# Patient Record
Sex: Male | Born: 1935 | Race: White | Hispanic: No | Smoking: Never smoker
Health system: Southern US, Community
[De-identification: ages and names within clinical notes are randomized; demographics above are authoritative.]

## PROBLEM LIST (undated history)

## (undated) DIAGNOSIS — I4892 Unspecified atrial flutter: Secondary | ICD-10-CM

## (undated) DIAGNOSIS — E785 Hyperlipidemia, unspecified: Secondary | ICD-10-CM

## (undated) DIAGNOSIS — N400 Enlarged prostate without lower urinary tract symptoms: Secondary | ICD-10-CM

## (undated) DIAGNOSIS — R001 Bradycardia, unspecified: Secondary | ICD-10-CM

## (undated) DIAGNOSIS — F0390 Unspecified dementia without behavioral disturbance: Secondary | ICD-10-CM

## (undated) HISTORY — DX: Benign prostatic hyperplasia without lower urinary tract symptoms: N40.0

## (undated) HISTORY — DX: Hyperlipidemia, unspecified: E78.5

## (undated) HISTORY — DX: Unspecified atrial flutter: I48.92

## (undated) HISTORY — PX: CATARACT EXTRACTION: SUR2

## (undated) HISTORY — PX: TONSILLECTOMY: SHX5217

---

## 1988-04-17 ENCOUNTER — Encounter: Payer: Self-pay | Admitting: Internal Medicine

## 1992-05-18 ENCOUNTER — Encounter: Payer: Self-pay | Admitting: Internal Medicine

## 1994-01-26 ENCOUNTER — Encounter: Payer: Self-pay | Admitting: Internal Medicine

## 1997-10-14 ENCOUNTER — Encounter: Payer: Self-pay | Admitting: Internal Medicine

## 1997-10-15 ENCOUNTER — Encounter: Payer: Self-pay | Admitting: Internal Medicine

## 1997-10-15 LAB — CONVERTED CEMR LAB
Blood Glucose, Fasting: 94 mg/dL
PSA: 2.6 ng/mL

## 1998-08-11 ENCOUNTER — Encounter: Payer: Self-pay | Admitting: Internal Medicine

## 1998-08-11 LAB — CONVERTED CEMR LAB
PSA: 2.5 ng/mL
TSH: 2.26 microintl units/mL

## 1998-08-12 ENCOUNTER — Encounter: Payer: Self-pay | Admitting: Internal Medicine

## 1998-08-12 LAB — CONVERTED CEMR LAB
Blood Glucose, Fasting: 104 mg/dL
RBC count: 5.06 10*6/uL
WBC, blood: 7.3 10*3/uL

## 1999-11-17 ENCOUNTER — Encounter: Payer: Self-pay | Admitting: Internal Medicine

## 2000-12-05 ENCOUNTER — Encounter: Payer: Self-pay | Admitting: Internal Medicine

## 2000-12-05 LAB — CONVERTED CEMR LAB: PSA: 2.6 ng/mL

## 2004-03-10 ENCOUNTER — Encounter: Payer: Self-pay | Admitting: Internal Medicine

## 2005-05-02 ENCOUNTER — Ambulatory Visit: Payer: Self-pay | Admitting: Internal Medicine

## 2005-05-09 ENCOUNTER — Ambulatory Visit: Payer: Self-pay | Admitting: Internal Medicine

## 2005-08-20 ENCOUNTER — Ambulatory Visit: Payer: Self-pay | Admitting: Family Medicine

## 2006-04-17 ENCOUNTER — Ambulatory Visit: Payer: Self-pay | Admitting: Internal Medicine

## 2006-04-22 ENCOUNTER — Ambulatory Visit: Payer: Self-pay | Admitting: Internal Medicine

## 2006-04-22 ENCOUNTER — Encounter: Payer: Self-pay | Admitting: Internal Medicine

## 2006-04-22 LAB — CONVERTED CEMR LAB: Blood Glucose, Fasting: 102 mg/dL

## 2007-08-05 ENCOUNTER — Encounter: Payer: Self-pay | Admitting: Internal Medicine

## 2007-09-09 ENCOUNTER — Encounter: Payer: Self-pay | Admitting: Internal Medicine

## 2007-09-11 ENCOUNTER — Ambulatory Visit: Payer: Self-pay | Admitting: Internal Medicine

## 2007-09-11 DIAGNOSIS — N401 Enlarged prostate with lower urinary tract symptoms: Secondary | ICD-10-CM

## 2007-09-11 DIAGNOSIS — M109 Gout, unspecified: Secondary | ICD-10-CM | POA: Insufficient documentation

## 2007-09-11 DIAGNOSIS — E785 Hyperlipidemia, unspecified: Secondary | ICD-10-CM

## 2007-09-11 DIAGNOSIS — R3915 Urgency of urination: Secondary | ICD-10-CM

## 2007-09-11 HISTORY — DX: Gout, unspecified: M10.9

## 2007-09-11 HISTORY — DX: Benign prostatic hyperplasia with lower urinary tract symptoms: N40.1

## 2007-09-12 ENCOUNTER — Encounter: Payer: Self-pay | Admitting: Internal Medicine

## 2007-12-29 ENCOUNTER — Ambulatory Visit: Payer: Self-pay | Admitting: Family Medicine

## 2008-02-09 ENCOUNTER — Telehealth (INDEPENDENT_AMBULATORY_CARE_PROVIDER_SITE_OTHER): Payer: Self-pay | Admitting: *Deleted

## 2008-02-10 ENCOUNTER — Telehealth (INDEPENDENT_AMBULATORY_CARE_PROVIDER_SITE_OTHER): Payer: Self-pay | Admitting: *Deleted

## 2008-06-23 ENCOUNTER — Encounter: Payer: Self-pay | Admitting: Internal Medicine

## 2008-08-24 ENCOUNTER — Ambulatory Visit: Payer: Self-pay | Admitting: Family Medicine

## 2008-09-01 ENCOUNTER — Ambulatory Visit: Payer: Self-pay | Admitting: Family Medicine

## 2008-09-13 ENCOUNTER — Telehealth: Payer: Self-pay | Admitting: Family Medicine

## 2008-10-14 ENCOUNTER — Ambulatory Visit: Payer: Self-pay | Admitting: Family Medicine

## 2008-10-15 LAB — CONVERTED CEMR LAB: PSA: 3.45 ng/mL (ref 0.10–4.00)

## 2008-11-01 ENCOUNTER — Ambulatory Visit: Payer: Self-pay | Admitting: Internal Medicine

## 2008-11-02 LAB — CONVERTED CEMR LAB
ALT: 18 units/L (ref 0–53)
AST: 24 units/L (ref 0–37)
Albumin: 3.8 g/dL (ref 3.5–5.2)
Alkaline Phosphatase: 42 units/L (ref 39–117)
Basophils Relative: 0.6 % (ref 0.0–3.0)
Cholesterol: 141 mg/dL (ref 0–200)
GFR calc Af Amer: 81 mL/min
GFR calc non Af Amer: 67 mL/min
HCT: 42.3 % (ref 39.0–52.0)
HDL: 43.4 mg/dL (ref >39.0)
Hemoglobin: 14.4 g/dL (ref 13.0–17.0)
LDL Cholesterol: 77 mg/dL (ref 0–99)
Neutrophils Relative %: 39.5 % — ABNORMAL LOW (ref 43.0–77.0)
Phosphorus: 4.5 mg/dL (ref 2.3–4.6)
Platelets: 183 10*3/uL (ref 150–400)
Potassium: 3.9 meq/L (ref 3.5–5.1)
RBC: 4.51 M/uL (ref 4.22–5.81)
TSH: 2.89 microintl units/mL (ref 0.35–5.50)
Total CHOL/HDL Ratio: 3.2
Uric Acid, Serum: 6.8 mg/dL (ref 4.0–7.8)
WBC: 7.8 10*3/uL (ref 4.5–10.5)

## 2009-02-08 ENCOUNTER — Ambulatory Visit: Payer: Self-pay | Admitting: Internal Medicine

## 2009-06-17 ENCOUNTER — Telehealth: Payer: Self-pay | Admitting: Internal Medicine

## 2009-06-27 ENCOUNTER — Encounter: Payer: Self-pay | Admitting: Internal Medicine

## 2009-08-22 ENCOUNTER — Encounter: Payer: Self-pay | Admitting: Internal Medicine

## 2009-10-05 ENCOUNTER — Encounter: Payer: Self-pay | Admitting: Internal Medicine

## 2009-12-26 ENCOUNTER — Encounter: Payer: Self-pay | Admitting: Internal Medicine

## 2009-12-26 ENCOUNTER — Emergency Department (HOSPITAL_COMMUNITY): Admission: EM | Admit: 2009-12-26 | Discharge: 2009-12-26 | Payer: Self-pay | Admitting: Emergency Medicine

## 2010-01-06 ENCOUNTER — Ambulatory Visit: Payer: Self-pay | Admitting: Internal Medicine

## 2010-02-03 ENCOUNTER — Ambulatory Visit: Payer: Self-pay | Admitting: Internal Medicine

## 2010-02-22 ENCOUNTER — Ambulatory Visit: Payer: Self-pay | Admitting: Internal Medicine

## 2010-02-27 ENCOUNTER — Encounter: Payer: Self-pay | Admitting: Internal Medicine

## 2010-06-27 ENCOUNTER — Encounter: Payer: Self-pay | Admitting: Internal Medicine

## 2010-08-01 ENCOUNTER — Ambulatory Visit: Payer: Self-pay | Admitting: Internal Medicine

## 2010-08-28 ENCOUNTER — Encounter: Payer: Self-pay | Admitting: Internal Medicine

## 2010-11-05 LAB — CONVERTED CEMR LAB
ALT: 18 units/L (ref 0–53)
ALT: 18 units/L (ref 0–53)
Alkaline Phosphatase: 49 units/L (ref 39–117)
Basophils Absolute: 0.1 10*3/uL (ref 0.0–0.1)
Basophils Relative: 1.8 % — ABNORMAL HIGH (ref 0.0–1.0)
Bilirubin, Direct: 0.2 mg/dL (ref 0.0–0.3)
CO2: 28 meq/L (ref 19–32)
CO2: 31 meq/L (ref 19–32)
Calcium: 8.6 mg/dL (ref 8.4–10.5)
Chloride: 102 meq/L (ref 96–112)
Chloride: 105 meq/L (ref 96–112)
Creatinine, Ser: 1 mg/dL (ref 0.4–1.5)
GFR calc Af Amer: 74 mL/min
GFR calc non Af Amer: 74.87 mL/min (ref >60)
Glucose, Bld: 93 mg/dL (ref 70–99)
HCT: 42.8 % (ref 39.0–52.0)
HDL: 42 mg/dL (ref >39.0)
Hemoglobin: 14.9 g/dL (ref 13.0–17.0)
Lymphocytes Relative: 24.6 % (ref 12.0–46.0)
MCHC: 33.9 g/dL (ref 30.0–36.0)
MCV: 92.2 fL (ref 78.0–100.0)
MCV: 93.9 fL (ref 78.0–100.0)
Monocytes Absolute: 0.9 10*3/uL — ABNORMAL HIGH (ref 0.2–0.7)
Monocytes Relative: 10.5 % (ref 3.0–11.0)
Monocytes Relative: 6.3 % (ref 3.0–12.0)
Neutro Abs: 4.2 10*3/uL (ref 1.4–7.7)
Neutrophils Relative %: 52.2 % (ref 43.0–77.0)
Neutrophils Relative %: 67.5 % (ref 43.0–77.0)
Platelets: 218 10*3/uL (ref 150–400)
Potassium: 4 meq/L (ref 3.5–5.1)
RBC: 4.64 M/uL (ref 4.22–5.81)
RDW: 15 % — ABNORMAL HIGH (ref 11.5–14.6)
Sodium: 137 meq/L (ref 135–145)
TSH: 2.79 microintl units/mL (ref 0.35–5.50)
Total Protein: 6.2 g/dL (ref 6.0–8.3)
Triglycerides: 89 mg/dL (ref 0–149)
WBC: 8.1 10*3/uL (ref 4.5–10.5)

## 2010-11-09 NOTE — Letter (Signed)
Summary: [facility]  [facility]   Imported By: Lanelle Bal 03/08/2010 10:21:43  _____________________________________________________________________  External Attachment:    Type:   Image     Comment:   External Document  Appended Document: [facility] PSA down on finasteride on tamsulosin also 6 month follow up

## 2010-11-09 NOTE — Letter (Signed)
Summary: [facility]  [facility]   Imported By: Lanelle Bal 09/07/2010 12:10:43  _____________________________________________________________________  External Attachment:    Type:   Image     Comment:   External Document  Appended Document: [facility] voiding okay on current Rx 1 year follow up

## 2010-11-09 NOTE — Letter (Signed)
Summary: [facility]  [facility]   Imported By: Lanelle Bal 10/11/2009 12:12:52  _____________________________________________________________________  External Attachment:    Type:   Image     Comment:   External Document  Appended Document: [facility] hematuria and prostate nodule switching to finasteride
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
## 2010-11-09 NOTE — Miscellaneous (Signed)
Summary: flu shot at [facility]  Clinical Lists Changes  Observations: Added new observation of FLU VAX: Historical (06/26/2010 9:59)      Immunization History:  Influenza Immunization History:    Influenza:  historical (06/26/2010) Pt received flu vaccine at [address], [place].              Lowella Petties CMA  June 27, 2010 9:59 AM
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
## 2010-11-09 NOTE — Assessment & Plan Note (Signed)
Summary: 6 M F/U DLO   Vital Signs:  Patient profile:   77 year old male Weight:      197 pounds Temp:     97.7 degrees F oral Pulse rate:   60 / minute Pulse rhythm:   regular BP sitting:   140 / 62  (left arm) Cuff size:   large  Vitals Entered By: Mervin Hack CMA Duncan Dull) (August 01, 2010 11:09 AM) CC: 6 month follow-up   History of Present Illness: DOing well  No new concerns  No heart symptoms Tries to "take it easy' Still does a little yard work--limits his time No palpitations No chest pain No SOB  Gout occ gives him some mild symptoms very brief hasn't needed meds Generally in right ankle or knee  No trouble voiding Nocturia x 2--stable some urgency in day--occ leaks no real change  Has puffy place on right shoulder there for a long time No pain  Allergies: No Known Drug Allergies  Past History:  Past medical, surgical, family and social histories (including risk factors) reviewed for relevance to current acute and chronic problems.  Past Medical History: Reviewed history from 02/22/2010 and no changes required. Gout Hyperlipidemia Benign prostatic hypertrophy Atrial flutter  Past Surgical History: Reviewed history from 09/11/2007 and no changes required. Cataract extraction Tonsillectomy  Family History: Reviewed history from 09/11/2007 and no changes required. Mom died of cancer (unknown type) Dad died of throat cancer 1 brother & 3 sisters No CAD, HTN, DM  Social History: Reviewed history from 09/11/2007 and no changes required. Married--4 children Retired--from [employer] Never Smoked Alcohol use-rare  Review of Systems       appetite is good weight stable sleeps fine  Physical Exam  General:  alert and normal appearance.   Neck:  supple, no masses, no thyromegaly, no carotid bruits, and no cervical lymphadenopathy.   Lungs:  normal respiratory effort, no intercostal retractions, no accessory muscle use, and  normal breath sounds.   Heart:  normal rate, regular rhythm, no murmur, and no gallop.   Msk:  no joint tenderness and no joint swelling.   Extremities:  no sig edema Neurologic:  alert & oriented X3, strength normal in all extremities, and gait normal.   Skin:  4x4cm, 2cm deep firm mass over right deltoid (seems to be lipoma--reassured) Psych:  normally interactive, good eye contact, not anxious appearing, and not depressed appearing.     Impression & Recommendations:  Problem # 1:  ATRIAL FLUTTER (ICD-427.31) Assessment Comment Only no changes no symptoms He prefers no further intervention unless has problems  His updated medication list for this problem includes:    Aspirin Ec 81 Mg Tbec (Aspirin) .Marland Kitchen... 1 daily  Problem # 2:  BENIGN PROSTATIC HYPERTROPHY (ICD-600.00) Assessment: Unchanged voiding okay on current regimen has kept up with urologist  Problem # 3:  GOUT (ICD-274.9) Assessment: Unchanged occ ankle and knee symptoms not sure if gout or osteoarthritis uses tylenol occ with success  Complete Medication List: 1)  Flomax 0.4 Mg Cp24 (Tamsulosin hcl) .... Take 1 capsule by mouth daily 2)  Aspirin Ec 81 Mg Tbec (Aspirin) .Marland Kitchen.. 1 daily 3)  Finasteride 5 Mg Tabs (Finasteride) .... Take one tablet once daily  Patient Instructions: 1)  Please schedule a follow-up appointment in 6 months .    Orders Added: 1)  Est. Patient Level IV [16109]    Current Allergies (reviewed today): No known allergies

## 2010-12-09 ENCOUNTER — Encounter: Payer: Self-pay | Admitting: Internal Medicine

## 2011-01-23 ENCOUNTER — Ambulatory Visit (INDEPENDENT_AMBULATORY_CARE_PROVIDER_SITE_OTHER): Payer: Medicare Other | Admitting: Internal Medicine

## 2011-01-23 ENCOUNTER — Encounter: Payer: Self-pay | Admitting: Internal Medicine

## 2011-01-23 VITALS — BP 140/60 | HR 44 | Temp 98.4°F | Ht 72.0 in | Wt 198.0 lb

## 2011-01-23 DIAGNOSIS — I4892 Unspecified atrial flutter: Secondary | ICD-10-CM | POA: Insufficient documentation

## 2011-01-23 DIAGNOSIS — I4891 Unspecified atrial fibrillation: Secondary | ICD-10-CM | POA: Insufficient documentation

## 2011-01-23 DIAGNOSIS — E785 Hyperlipidemia, unspecified: Secondary | ICD-10-CM

## 2011-01-23 DIAGNOSIS — N400 Enlarged prostate without lower urinary tract symptoms: Secondary | ICD-10-CM

## 2011-01-23 DIAGNOSIS — M109 Gout, unspecified: Secondary | ICD-10-CM

## 2011-01-23 HISTORY — DX: Unspecified atrial fibrillation: I48.91

## 2011-01-23 NOTE — Patient Instructions (Addendum)
Please make sure you take your aspirin every day!!  Atrial Fibrillation (Irregular Heartbeat) Your caregiver has diagnosed you with atrial fibrillation (AFib). The heart normally beats very regularly; AFib is a type of irregular heartbeat. The heart rate may be faster or slower than normal. This can prevent your heart from pumping as well as it should. AFib can be constant (chronic) or intermittent (paroxysmal). CAUSES Atrial fibrillation may be caused by:  Heart disease, including heart attack, coronary artery disease, heart failure, diseases of the heart valves, and others.   Blood clot in the lungs (pulmonary embolism).   Pneumonia or other infections.   Chronic lung disease.   Thyroid disease.   Toxins. These include alcohol, some medications (such as decongestant medications or diet pills), and caffeine.  In some people, no cause for AFib can be found. This is referred to as Lone Atrial Fibrillation. SYMPTOMS  Palpitations or a fluttering in your chest.   A vague sense of chest discomfort.   Shortness of breath.   Sudden onset of lightheadedness or weakness.  Sometimes, the first sign of AFib can be a complication of the condition. This could be a stroke or heart failure. DIAGNOSIS Your description of your condition may make your caregiver suspicious of atrial fibrillation. Your caregiver will examine your pulse to determine if fibrillation is present. An EKG (electrocardiogram) will confirm the diagnosis. Further testing may help determine what caused you to have atrial fibrillation. This may include chest x-ray, echocardiogram, blood tests, or CT scans. PREVENTION If you have previously had atrial fibrillation, your caregiver may advise you to avoid substances known to cause the condition (such as stimulant medications, and possibly caffeine or alcohol). You may be advised to use medications to prevent recurrence. Proper treatment of any underlying condition is important to  help prevent recurrence. PROGNOSIS Atrial fibrillation does tend to become a chronic condition over time. It can cause significant complications (see below). Atrial fibrillation is not usually immediately life-threatening, but it can shorten your life expectancy. This seems to be worse in women. If you have lone atrial fibrillation and are under 24 years old, the risk of complications is very low, and life expectancy is not shortened. RISKS AND COMPLICATIONS Complications of atrial fibrillation can include stroke, chest pain, and heart failure. Your caregiver will recommend treatments for the atrial fibrillation, as well as for any underlying conditions, to help minimize risk of complications. TREATMENT Treatment for AFib is divided into several categories:  Treatment of any underlying condition.   Converting you out of AFib into a regular (sinus) rhythm.   Controlling rapid heart rate.   Prevention of blood clots and stroke.  Medications and procedures are available to convert your atrial fibrillation to sinus rhythm. However, recent studies have shown that this may not offer you any advantage, and cardiac experts are continuing research and debate on this topic. More important is controlling your rapid heartbeat. The rapid heartbeat causes more symptoms, and places strain on your heart. Your caregiver will advise you on the use of medications that can control your heart rate. Atrial fibrillation is a strong stroke risk. You can lessen this risk by taking blood thinning medications such as Coumadin (warfarin), or sometimes aspirin. These medications need close monitoring by your caregiver. Over-medication can cause bleeding. Too little medication may not protect against stroke. HOME CARE INSTRUCTIONS  If your caregiver prescribed medicine to make your heartbeat more normally, take as directed.   If blood thinners were prescribed by your caregiver,  take EXACTLY as directed.   Perform blood  tests EXACTLY as directed.   Quit smoking. Smoking increases your cardiac and lung (pulmonary) risks.   DO NOT drink alcohol.   DO NOT drink caffeinated drinks (e.g. coffee, pop, chocolate, and leaf teas). You may drink decaffeinated coffee, pop or tea.   If you are overweight, you should choose a reduced calorie diet to lose weight. Please see a registered dietitian if you need more information about healthy weight loss. DO NOT USE DIET PILLS as they may aggravate heart problems.   If you have other heart problems that are causing AFib, you may need to eat a low salt, fat, and cholesterol diet. Your caregiver will tell you if this is necessary.   Exercise every day to improve your physical fitness. Stay active unless advised otherwise.   If your caregiver has given you a follow-up appointment, it is very important to keep that appointment. Not keeping the appointment could result in heart failure or stroke. If there is any problem keeping the appointment, you must call back to this facility for assistance.  SEEK MEDICAL CARE IF:  You notice a change in the rate, rhythm or strength of your heartbeat.   You develop an infection or any other change in your overall health status.  SEEK IMMEDIATE MEDICAL CARE IF:  You develop chest pain, abdominal pain, sweating, weakness or feel sick to your stomach (nausea).   You develop shortness of breath.   You develop swollen feet and ankles.   You develop dizziness, numbness, or weakness of your face or limbs, or any change in vision or speech.  MAKE SURE YOU:  Understand these instructions.   Will watch your condition.   Will get help right away if you are not doing well or get worse.  Document Released: 09/24/2005 Document Re-Released: 03/14/2010 [facility] Patient Information 2011 [place], [place].

## 2011-01-23 NOTE — Progress Notes (Signed)
  Subjective:    Patient ID: Matthew Howard, male    DOB: 1919/06/27, 77 y.o.   MRN: [id number]  HPI DOing fairly well Did have sense of "something coming over my body" last night after supper Restless, had to get up Felt sweaty--no better with a couple of aspirin Tingling in hands Took a shower and went to bed.  Feels better this AM but wonders what happened No palpitations No chest pain No dyspnea  No problems voiding Nocturia x 2 but stable Still on meds for this  No gout problems No other arthritis problems  Current outpatient prescriptions:aspirin 81 MG EC tablet, Take 81 mg by mouth daily.  , Disp: , Rfl: ;  finasteride (PROSCAR) 5 MG tablet, Take 5 mg by mouth daily.  , Disp: , Rfl: ;  Tamsulosin HCl (FLOMAX) 0.4 MG CAPS, Take 0.4 mg by mouth daily.  , Disp: , Rfl:   Past Medical History  Diagnosis Date  . Gout   . Hyperlipidemia   . Atrial flutter   . Benign prostatic hypertrophy     Past Surgical History  Procedure Date  . Cataract extraction   . Tonsillectomy     Family History  Problem Relation Age of Onset  . Cancer Mother   . Throat cancer Father     History   Social History  . Marital Status: Married    Spouse Name: Demico Ploch    Number of Children: 4  . Years of Education: N/A   Occupational History  . retired     [employer]   Social History Main Topics  . Smoking status: Never Smoker   . Smokeless tobacco: Not on file  . Alcohol Use: Yes     rare etoh use  . Drug Use: Not on file  . Sexually Active: Not on file   Other Topics Concern  . Not on file   Social History Narrative  . No narrative on file   Review of Systems Stays active--flowers in garden. Occ walks Appetite okay Weight is stable     Objective:   Physical Exam  Constitutional: He appears well-developed and well-nourished. No distress.  Neck: Normal range of motion. Neck supple. No JVD present. No thyromegaly present.       ?referred murmur in right  carotid  Cardiovascular: Normal rate and intact distal pulses.  Exam reveals no gallop.   Murmur heard.      Soft systolic murmur at base but blowing quality soft murmur at apex also Irregular rhythm  Pulmonary/Chest: Effort normal and breath sounds normal. No respiratory distress. He has no wheezes. He has no rales.  Abdominal: Soft. He exhibits no mass. There is no tenderness.  Musculoskeletal: He exhibits no tenderness.       Trace to 1+ left ankle edema  Lymphadenopathy:    He has no cervical adenopathy.  Psychiatric: He has a normal mood and affect. His behavior is normal. Judgment and thought content normal.          Assessment & Plan:

## 2011-02-21 ENCOUNTER — Encounter: Payer: Self-pay | Admitting: Internal Medicine

## 2011-02-21 ENCOUNTER — Other Ambulatory Visit: Payer: Self-pay | Admitting: *Deleted

## 2011-02-21 ENCOUNTER — Ambulatory Visit (INDEPENDENT_AMBULATORY_CARE_PROVIDER_SITE_OTHER): Payer: Medicare Other | Admitting: Internal Medicine

## 2011-02-21 VITALS — BP 148/62 | HR 52 | Temp 97.7°F | Ht 72.0 in | Wt 200.0 lb

## 2011-02-21 DIAGNOSIS — N400 Enlarged prostate without lower urinary tract symptoms: Secondary | ICD-10-CM

## 2011-02-21 DIAGNOSIS — M109 Gout, unspecified: Secondary | ICD-10-CM

## 2011-02-21 DIAGNOSIS — I4891 Unspecified atrial fibrillation: Secondary | ICD-10-CM

## 2011-02-21 NOTE — Progress Notes (Signed)
  Subjective:    Patient ID: Matthew Howard, male    DOB: 1919/06/06, 76 y.o.   MRN: [id number]  HPI DOing well Feels pretty well  Gets occ itching in shoulder No major pain  Gets some swelling in left ankle No pain though No gout problems  No palpitations No chest pain Does get tired with activity--has to rest About the same for a while Can probably walk at least a mile without a problem Does take rest if carrying weights with yard work, etc  Still with nocturia x 2 Notes some delay in voiding at night---not in daytime though  Current outpatient prescriptions:aspirin 81 MG EC tablet, Take 81 mg by mouth daily.  , Disp: , Rfl: ;  finasteride (PROSCAR) 5 MG tablet, Take 5 mg by mouth daily.  , Disp: , Rfl: ;  Tamsulosin HCl (FLOMAX) 0.4 MG CAPS, Take 0.4 mg by mouth daily.  , Disp: , Rfl:   Past Medical History  Diagnosis Date  . Gout   . Hyperlipidemia   . Atrial flutter   . Benign prostatic hypertrophy     Past Surgical History  Procedure Date  . Cataract extraction   . Tonsillectomy     Family History  Problem Relation Age of Onset  . Cancer Mother   . Throat cancer Father     History   Social History  . Marital Status: Married    Spouse Name: Matthew Howard    Number of Children: 4  . Years of Education: N/A   Occupational History  . retired     [employer]   Social History Main Topics  . Smoking status: Never Smoker   . Smokeless tobacco: Not on file  . Alcohol Use: Yes     rare etoh use  . Drug Use: Not on file  . Sexually Active: Not on file   Other Topics Concern  . Not on file   Social History Narrative  . No narrative on file   Review of Systems occ tingling in left hand Appetite is good Weight stable Sleeps well    Objective:   Physical Exam  Constitutional: He appears well-developed and well-nourished. No distress.  Neck: Normal range of motion. No thyromegaly present.  Cardiovascular: Normal rate and intact distal pulses.   Exam reveals no gallop.   Murmur heard.      Slightly irregular Soft mitral systolic murmur  Pulmonary/Chest: Effort normal and breath sounds normal. No respiratory distress. He has no wheezes. He has no rales.  Abdominal: Soft. There is no tenderness.  Musculoskeletal: Normal range of motion. He exhibits no tenderness.       Slight edema without pitting at left ankle  Lymphadenopathy:    He has no cervical adenopathy.  Psychiatric: He has a normal mood and affect. His behavior is normal. Judgment and thought content normal.          Assessment & Plan:

## 2011-02-23 ENCOUNTER — Ambulatory Visit: Payer: BC Managed Care – PPO | Admitting: Internal Medicine

## 2011-02-23 NOTE — Assessment & Plan Note (Signed)
[facility]                             [place] OFFICE NOTE   SANDERS, MANNINEN                     MRN:          [id number]  DATE:04/17/2006                            DOB:          1919/07/13    Matthew Howard is a delightful 77 year old gentleman last seen in the office  August 20, 2005 by Everrett Coombe, FNP-C, for bronchitis.  I last say the  patient on May 02, 2005.  The patient reports in the interval that he has  had cataract surgery in both eyes with intraocular lens implants in the  winter of 2007.  The patient also recently picked up new hearing aids.  Otherwise, he reports he is feeling well and doing well.   Past medical history, family history and social history are well-documented  in my note of March 08, 2004 without any changes.   REVIEW OF SYSTEMS:  The patient  has had no fevers, sweats, chills, weight  changes or other constitutional symptoms.  He has had cataract surgery as  noted above.  No ENT, cardiovascular, respiratory, GI or GU complaints.  He  does report having cramps in his hands.   CURRENT MEDICATIONS:  1.  Zocor 40 mg daily.  2.  Indocin on a p.r.n. basis for gout, which has been quiescent.   EXAMINATION:  VITAL SIGNS:  Temperature was 97, blood pressure 134/68, pulse  68.  Weight 193.  Height 6 feet 1 inch.  GENERAL APPEARANCE:  This is a well-nourished, well-developed gentleman  looking younger than his stated chronologic age, in no acute distress.  HEENT:  Normocephalic, atraumatic.  EACs with cerumen, but no impaction.  TMs were normal.  Oropharynx without lesions.  Posterior pharynx was clear.  Conjunctivae and sclerae were clear.  Pupils were equal, round and reactive.  No further exam conducted.  NECK:  Supple without thyromegaly.  NODES:  No adenopathy was noted in the cervical or supraclavicular regions.  CHEST:  No CVA tenderness.  Lungs are clear to auscultation and percussion.  CARDIOVASCULAR:   Radial pulses 2+.  No JVD.  No carotid bruits.  He had a  quiet precordium with a regular rate and rhythm, without murmurs.  ABDOMEN:  Soft.  No guarding or rebound.  No organosplenomegaly was noted.  RECTAL:  Exam revealed normal sphincter tone with external hemorrhoidal skin  tags noted.  The patient's prostate was slightly enlarged; it was firm  without specific nodule.  EXTREMITIES:  Without clubbing, cyanosis, or edema.   ASSESSMENT AND PLAN:  1.  Hyperlipidemia:  The patient was given a refill prescription for his      Zocor.  He would be a candidate for followup laboratory and he will      return for that at his convenience.  Last laboratory from March 10, 2004      revealed an LDL of 104.  2.  Gout.  The patient reports he has not had a flare of gout and is doing      well.  3.  Ophthalmologic:  Patient with successful surgery and is doing well.  4.  Health maintenance:  Given the patient's age and his last flexible      sigmoidoscopy in 2002, he is not a candidate at this time for further      treatment.   In summary, this is a pleasant gentleman who seems to be medically stable at  this time.  He will be notified as to his laboratory results.  He will  return to see me in 1 year or on an as-needed basis.                                   Rosalyn Gess Norins, MD   MEN/MedQ  DD:  04/18/2006  DT:  04/18/2006  Job #:  [id number]   cc:   Eda Keys

## 2011-03-23 ENCOUNTER — Encounter: Payer: Self-pay | Admitting: Family Medicine

## 2011-03-23 ENCOUNTER — Ambulatory Visit (INDEPENDENT_AMBULATORY_CARE_PROVIDER_SITE_OTHER): Payer: Medicare Other | Admitting: Family Medicine

## 2011-03-23 ENCOUNTER — Ambulatory Visit: Payer: Medicare Other | Admitting: Family Medicine

## 2011-03-23 VITALS — BP 130/58 | HR 56 | Temp 97.9°F | Wt 197.0 lb

## 2011-03-23 DIAGNOSIS — L0291 Cutaneous abscess, unspecified: Secondary | ICD-10-CM

## 2011-03-23 DIAGNOSIS — L0390 Cellulitis, unspecified: Secondary | ICD-10-CM

## 2011-03-23 MED ORDER — DOXYCYCLINE HYCLATE 100 MG PO TABS: 100.0000 mg | ORAL_TABLET | Freq: Two times a day (BID) | ORAL | Status: AC

## 2011-03-23 NOTE — Patient Instructions (Signed)
Start the doxycycline today and come back for a recheck on Monday with Dr. Alphonsus Sias or me.  If you get more redness or pain in the meantime, then notify the on call doctor.  This should gradually get better.  Take care.

## 2011-03-23 NOTE — Progress Notes (Signed)
Slipped going up steps and hit R knee about 2 weeks ago.  Anterior inferior knee was scabbed over.  It wasn't puffy or red until 1-2 days ago.  Now the anterior knee is puffy and red, tender to touch.  No FCNAV.    Meds, vitals, and allergies reviewed.   ROS: See HPI.  Otherwise, noncontributory.  nad Knee is not ttp or red posteriorly or medially/laterally.  Joint line isn't ttp.  3x3 cm scab inferior to R knee .  Pain anteriorly with knee flexion, no pain with extension.  Anterior knee with erythema that was marked today (superior to the scab).  Tender puffy raised area superficial to the kneecap noted.    I talked with patient about this.  It could potentially contain purulent material and may need aspiration.  I told him it could just be a cellulitis that wouldn't yield fluid.  Verbal informed consent for aspiration of the area.  Risks not limited to infection, bleeding, damage to near by organs.  He wanted to proceed.   Area cleaned with betadine/etoh.  Topical spray anesthetic used and sterile 19g needle advanced but unable to aspirate any fluid.  Tolerated well, minimal blood loss.  Good hemostasis.  No complications.

## 2011-03-23 NOTE — Assessment & Plan Note (Signed)
No purulent material aspirated.  I doubt an infected bursa or abscess given the lack of yield.  Likely a cellulitis with local tissue puffiness.  Okay for outpatient fu.  He'll monitor the border, call MD if enlarging or systemic sx.  This doesn't appear to be a septic joint.  Start doxy and then fu on Monday.  He agrees.

## 2011-03-26 ENCOUNTER — Ambulatory Visit (INDEPENDENT_AMBULATORY_CARE_PROVIDER_SITE_OTHER): Payer: Medicare Other | Admitting: Family Medicine

## 2011-03-26 ENCOUNTER — Encounter: Payer: Self-pay | Admitting: Family Medicine

## 2011-03-26 DIAGNOSIS — L03119 Cellulitis of unspecified part of limb: Secondary | ICD-10-CM

## 2011-03-26 DIAGNOSIS — L0390 Cellulitis, unspecified: Secondary | ICD-10-CM

## 2011-03-26 NOTE — Assessment & Plan Note (Signed)
Improved, continue doxy and f/u prn. He agrees.  Area dressed in clinic.

## 2011-03-26 NOTE — Progress Notes (Signed)
F/u of cellulitis of leg. Improved with less pain, erythema, and less puffy.  Feeling well o/w.  ROM improved.  No other complaints.  On doxy.  No ADE. Sun-sensitivity caution prev given to patient.  Meds, vitals, and allergies reviewed.   ROS: See HPI.  Otherwise, noncontributory.  nad r knee with dec in erythema and puffiness, inferior scab still healing.  rom improved and gait improved with no pain

## 2011-03-26 NOTE — Patient Instructions (Signed)
I would take all antibiotics.  Let us know if the area gets redder or more painful.  This should resolve.  Take care.

## 2011-06-25 ENCOUNTER — Other Ambulatory Visit: Payer: Self-pay | Admitting: Internal Medicine

## 2011-08-24 ENCOUNTER — Encounter: Payer: Self-pay | Admitting: Internal Medicine

## 2011-08-24 ENCOUNTER — Ambulatory Visit (INDEPENDENT_AMBULATORY_CARE_PROVIDER_SITE_OTHER): Payer: Medicare Other | Admitting: Internal Medicine

## 2011-08-24 DIAGNOSIS — N400 Enlarged prostate without lower urinary tract symptoms: Secondary | ICD-10-CM

## 2011-08-24 DIAGNOSIS — I4891 Unspecified atrial fibrillation: Secondary | ICD-10-CM

## 2011-08-24 DIAGNOSIS — M109 Gout, unspecified: Secondary | ICD-10-CM

## 2011-08-24 LAB — BASIC METABOLIC PANEL
BUN: 18 mg/dL (ref 6–23)
CO2: 29 mEq/L (ref 19–32)
Calcium: 8.8 mg/dL (ref 8.4–10.5)
Chloride: 104 mEq/L (ref 96–112)
Creatinine, Ser: 1 mg/dL (ref 0.4–1.5)
GFR: 71.3 mL/min (ref >60.00)
Potassium: 4.3 mEq/L (ref 3.5–5.1)
Sodium: 142 mEq/L (ref 135–145)

## 2011-08-24 LAB — CBC WITH DIFFERENTIAL/PLATELET
Eosinophils Absolute: 0.2 10*3/uL (ref 0.0–0.7)
Eosinophils Relative: 2.5 % (ref 0.0–5.0)
Hemoglobin: 13.9 g/dL (ref 13.0–17.0)
Lymphs Abs: 2.8 10*3/uL (ref 0.7–4.0)
Monocytes Absolute: 0.6 10*3/uL (ref 0.1–1.0)
Platelets: 223 10*3/uL (ref 150.0–400.0)
RBC: 4.4 Mil/uL (ref 4.22–5.81)
WBC: 8 10*3/uL (ref 4.5–10.5)

## 2011-08-24 LAB — HEPATIC FUNCTION PANEL
Albumin: 3.8 g/dL (ref 3.5–5.2)
Alkaline Phosphatase: 58 U/L (ref 39–117)

## 2011-08-24 NOTE — Assessment & Plan Note (Signed)
Good rate control without meds ASA only since only risk is his age

## 2011-08-24 NOTE — Assessment & Plan Note (Signed)
Voiding okay on his dual Rx Mild symptoms only (frequency and occ dribbling incontinence) No changes

## 2011-08-24 NOTE — Progress Notes (Signed)
  Subjective:    Patient ID: Matthew Howard, male    DOB: 06/16/1919, 77 y.o.   MRN: [id number]  HPI Feels well  No heart problems No palpitations No chest pain or SOB Does note limited exercise tolerance--but he thinks he can do 2 hours of outside work without resting No edema recently  No gout problems---or only occ twinges No other arthritis  Still with nocturia x 2---stable Notes daytime freq---every 2 hours--not bothersome occ gets urgency and incontinence (dribbling before he makes it to bathroom)  Current Outpatient Prescriptions on File Prior to Visit  Medication Sig Dispense Refill  . aspirin 81 MG EC tablet Take 81 mg by mouth daily.        . finasteride (PROSCAR) 5 MG tablet Take 5 mg by mouth daily.        . Tamsulosin HCl (FLOMAX) 0.4 MG CAPS TAKE 1 CAPSULE BY MOUTH DAILY  90 capsule  3    No Known Allergies  Past Medical History  Diagnosis Date  . Gout   . Hyperlipidemia   . Atrial flutter   . Benign prostatic hypertrophy     Past Surgical History  Procedure Date  . Cataract extraction   . Tonsillectomy     Family History  Problem Relation Age of Onset  . Cancer Mother   . Throat cancer Father     History   Social History  . Marital Status: Married    Spouse Name: Carson Bogden    Number of Children: 4  . Years of Education: N/A   Occupational History  . retired     [employer]   Social History Main Topics  . Smoking status: Never Smoker   . Smokeless tobacco: Never Used  . Alcohol Use: Yes     rare etoh use  . Drug Use: Not on file  . Sexually Active: Not on file   Other Topics Concern  . Not on file   Social History Narrative  . No narrative on file   Review of Systems Appetite is good Has lost some weight but he didn't notice. He is trying to be careful with his eating---had noticed his belly more Sleeps well     Objective:   Physical Exam  Constitutional: He appears well-developed and well-nourished. No distress.    Neck: Normal range of motion. Neck supple. No thyromegaly present.  Cardiovascular: Normal rate, normal heart sounds and intact distal pulses.  Exam reveals no gallop.   No murmur heard.      irregular  Pulmonary/Chest: Effort normal and breath sounds normal. No respiratory distress. He has no wheezes. He has no rales.  Abdominal: Soft. He exhibits no mass. There is no tenderness.       No suprapubic dullness  Musculoskeletal: He exhibits no edema and no tenderness.  Lymphadenopathy:    He has no cervical adenopathy.  Psychiatric: He has a normal mood and affect. His behavior is normal. Judgment and thought content normal.          Assessment & Plan:

## 2011-08-24 NOTE — Assessment & Plan Note (Signed)
Quiet Uses cherry juice if acts up at all with good effect

## 2012-06-23 ENCOUNTER — Other Ambulatory Visit: Payer: Self-pay | Admitting: *Deleted

## 2012-06-23 NOTE — Telephone Encounter (Signed)
Ok to fill? Last seen 06/2011, no future appointments scheduled

## 2012-06-24 MED ORDER — TAMSULOSIN HCL 0.4 MG PO CAPS: 0.4000 mg | ORAL_CAPSULE | Freq: Every day | ORAL | Status: DC

## 2012-06-24 NOTE — Telephone Encounter (Signed)
Okay to fill 6 months and have him schedule follow up soon

## 2012-06-24 NOTE — Telephone Encounter (Signed)
rx sent to pharmacy by e-script Spoke with patient and advised results, he will call back for an appt

## 2012-06-27 ENCOUNTER — Encounter: Payer: Self-pay | Admitting: Internal Medicine

## 2012-06-27 ENCOUNTER — Ambulatory Visit (INDEPENDENT_AMBULATORY_CARE_PROVIDER_SITE_OTHER): Payer: Medicare Other | Admitting: Internal Medicine

## 2012-06-27 VITALS — BP 140/68 | HR 61 | Temp 98.0°F | Ht 72.0 in | Wt 190.0 lb

## 2012-06-27 DIAGNOSIS — I4891 Unspecified atrial fibrillation: Secondary | ICD-10-CM | POA: Diagnosis not present

## 2012-06-27 DIAGNOSIS — N400 Enlarged prostate without lower urinary tract symptoms: Secondary | ICD-10-CM | POA: Diagnosis not present

## 2012-06-27 DIAGNOSIS — M109 Gout, unspecified: Secondary | ICD-10-CM | POA: Diagnosis not present

## 2012-06-27 LAB — CBC WITH DIFFERENTIAL/PLATELET
Eosinophils Absolute: 0.2 10*3/uL (ref 0.0–0.7)
Hemoglobin: 13.7 g/dL (ref 13.0–17.0)
Monocytes Absolute: 0.6 10*3/uL (ref 0.1–1.0)
Monocytes Relative: 8.5 % (ref 3.0–12.0)
Neutrophils Relative %: 49.7 % (ref 43.0–77.0)
Platelets: 184 10*3/uL (ref 150.0–400.0)
RBC: 4.44 Mil/uL (ref 4.22–5.81)
RDW: 15.5 % — ABNORMAL HIGH (ref 11.5–14.6)
WBC: 7.4 10*3/uL (ref 4.5–10.5)

## 2012-06-27 LAB — HEPATIC FUNCTION PANEL
AST: 21 U/L (ref 0–37)
Alkaline Phosphatase: 45 U/L (ref 39–117)
Total Protein: 6.6 g/dL (ref 6.0–8.3)

## 2012-06-27 LAB — BASIC METABOLIC PANEL
BUN: 20 mg/dL (ref 6–23)
Calcium: 9 mg/dL (ref 8.4–10.5)
Creatinine, Ser: 1 mg/dL (ref 0.4–1.5)
GFR: 71.16 mL/min (ref >60.00)
Glucose, Bld: 81 mg/dL (ref 70–99)

## 2012-06-27 LAB — TSH: TSH: 1.02 u[IU]/mL (ref 0.35–5.50)

## 2012-06-27 LAB — URIC ACID: Uric Acid, Serum: 7.5 mg/dL (ref 4.0–7.8)

## 2012-06-27 MED ORDER — FINASTERIDE 5 MG PO TABS: 5.0000 mg | ORAL_TABLET | Freq: Every day | ORAL | Status: DC

## 2012-06-27 NOTE — Assessment & Plan Note (Signed)
Occ mild outbreaks Cherry juice controls fine

## 2012-06-27 NOTE — Assessment & Plan Note (Signed)
Good rate control without meds ASA only due to age and no risk factors other than age

## 2012-06-27 NOTE — Assessment & Plan Note (Signed)
Mild symptoms but generally satisfied with the meds

## 2012-06-27 NOTE — Progress Notes (Signed)
  Subjective:    Patient ID: Matthew Howard, male    DOB: 08-19-1919, 78 y.o.   MRN: [id number]  HPI Doing well No new concerns Reviewed advanced directives  Still works hard in yard Stable exercise tolerance No palpitations No chest pain No dyspnea  Mild brief ankle edema No dizziness or syncope  Has had occ mild gout attacks Still does fine with the cherry juice  Voids okay Slow stream but okay Nocturia stable at 2 Some daytime urgency  Current Outpatient Prescriptions on File Prior to Visit  Medication Sig Dispense Refill  . aspirin 81 MG EC tablet Take 81 mg by mouth daily.        . Tamsulosin HCl (FLOMAX) 0.4 MG CAPS Take 1 capsule (0.4 mg total) by mouth daily.  90 capsule  1    No Known Allergies  Past Medical History  Diagnosis Date  . Gout   . Hyperlipidemia   . Atrial flutter   . Benign prostatic hypertrophy     Past Surgical History  Procedure Date  . Cataract extraction   . Tonsillectomy     Family History  Problem Relation Age of Onset  . Cancer Mother   . Throat cancer Father     History   Social History  . Marital Status: Married    Spouse Name: Daimion Balestrieri    Number of Children: 4  . Years of Education: N/A   Occupational History  . retired     [employer]   Social History Main Topics  . Smoking status: Never Smoker   . Smokeless tobacco: Never Used  . Alcohol Use: Yes     rare etoh use  . Drug Use: Not on file  . Sexually Active: Not on file   Other Topics Concern  . Not on file   Social History Narrative   No living willNo health care power of attorney--would want wife thoughWould accept resuscitation but wouldn't want prolonged artificial ventilationNo tube feeds if cognitively unaware   Review of Systems Mood is generally fine Notes easy bleeding on the aspirin---works with [employer] a lot    Objective:   Physical Exam  Constitutional: He appears well-developed and well-nourished. No distress.  Neck: Normal  range of motion. Neck supple. No thyromegaly present.  Cardiovascular: Normal rate, normal heart sounds and intact distal pulses.  Exam reveals no gallop.   No murmur heard.      irregular  Pulmonary/Chest: Effort normal and breath sounds normal. No respiratory distress. He has no wheezes. He has no rales.  Abdominal: Soft. There is no tenderness.  Musculoskeletal: He exhibits no edema and no tenderness.  Lymphadenopathy:    He has no cervical adenopathy.  Skin: No rash noted. No erythema.       Mycotic toenails Multiple large seborrheic keratoses  Psychiatric: He has a normal mood and affect. His behavior is normal.          Assessment & Plan:

## 2012-06-30 ENCOUNTER — Encounter: Payer: Self-pay | Admitting: *Deleted

## 2012-07-03 DIAGNOSIS — Z23 Encounter for immunization: Secondary | ICD-10-CM | POA: Diagnosis not present

## 2012-08-27 DIAGNOSIS — R972 Elevated prostate specific antigen [PSA]: Secondary | ICD-10-CM | POA: Diagnosis not present

## 2012-10-20 DIAGNOSIS — N138 Other obstructive and reflux uropathy: Secondary | ICD-10-CM | POA: Diagnosis not present

## 2012-10-20 DIAGNOSIS — N403 Nodular prostate with lower urinary tract symptoms: Secondary | ICD-10-CM | POA: Diagnosis not present

## 2012-10-20 DIAGNOSIS — R972 Elevated prostate specific antigen [PSA]: Secondary | ICD-10-CM | POA: Diagnosis not present

## 2012-11-12 ENCOUNTER — Encounter: Payer: Self-pay | Admitting: Family Medicine

## 2012-11-12 ENCOUNTER — Ambulatory Visit (INDEPENDENT_AMBULATORY_CARE_PROVIDER_SITE_OTHER): Payer: Medicare Other | Admitting: Family Medicine

## 2012-11-12 ENCOUNTER — Telehealth: Payer: Self-pay | Admitting: Internal Medicine

## 2012-11-12 VITALS — BP 140/60 | HR 70 | Temp 97.6°F | Wt 199.0 lb

## 2012-11-12 DIAGNOSIS — J069 Acute upper respiratory infection, unspecified: Secondary | ICD-10-CM

## 2012-11-12 MED ORDER — BENZONATATE 200 MG PO CAPS: 200.0000 mg | ORAL_CAPSULE | Freq: Three times a day (TID) | ORAL | Status: AC | PRN

## 2012-11-12 NOTE — Assessment & Plan Note (Signed)
Nontoxic, likely viral.  Would use tessalon for cough and f/u prn.  I would avoid antihistamines due to possible LUTS sx, esp with his prostate hx.

## 2012-11-12 NOTE — Telephone Encounter (Signed)
Will await the evaluation tomorrow

## 2012-11-12 NOTE — Progress Notes (Signed)
Cough worse in last 1-2 days, "coughed all night last night". Started about 1 week ago with persistent rhinorrhea.  Anterior clear rhinorrhea.  occ phlegm coughed up, clear.  No fevers.  No aches.  No ear pain.  No ST.  He took some OTC cough meds w/o much relief.  Prev he had some relief of cough with some warm grape juice.    Meds, vitals, and allergies reviewed.   ROS: See HPI.  Otherwise, noncontributory.  GEN: nad, alert and oriented HEENT: mucous membranes moist, tm w/o erythema, nasal exam w/o erythema, clear discharge noted,  OP with cobblestoning, sinuses not ttp NECK: supple w/o LA CV: IRR, not tachy PULM: ctab, no inc wob

## 2012-11-12 NOTE — Patient Instructions (Addendum)
Take tessalon for the cough, up to 3 times a day.  Take care.  This should gradually get better.

## 2012-11-12 NOTE — Telephone Encounter (Signed)
Patient Information:  Caller Name: Ryler  Phone: [phone]  Patient: Matthew Howard, Matthew Howard  Gender: Male  DOB: [birth year]  Age: 79 Years  PCP: Tillman Abide Navos)  Office Follow Up:  Does the office need to follow up with this patient?: No  Instructions For The Office: N/A   Symptoms  Reason For Call & Symptoms: Patient states he has cough, runny nose clear in color, afebrile.  Reviewed Health History In EMR: Yes  Reviewed Medications In EMR: Yes  Reviewed Allergies In EMR: Yes  Reviewed Surgeries / Procedures: Yes  Date of Onset of Symptoms: 11/05/2012  Guideline(s) Used:  Cough  Disposition Per Guideline:   See Today or Tomorrow in Office  Reason For Disposition Reached:   Patient wants to be seen  Advice Given:  N/A  Appointment Scheduled:  11/12/2012 15:30:00 Appointment Scheduled Provider:  Crawford Givens Clelia Croft) [facility])

## 2013-07-01 ENCOUNTER — Encounter: Payer: Self-pay | Admitting: Internal Medicine

## 2013-07-01 ENCOUNTER — Ambulatory Visit (INDEPENDENT_AMBULATORY_CARE_PROVIDER_SITE_OTHER): Payer: Medicare Other | Admitting: Internal Medicine

## 2013-07-01 VITALS — BP 144/70 | HR 78 | Temp 98.1°F | Ht 72.0 in | Wt 190.8 lb

## 2013-07-01 DIAGNOSIS — Z0000 Encounter for general adult medical examination without abnormal findings: Secondary | ICD-10-CM | POA: Insufficient documentation

## 2013-07-01 DIAGNOSIS — I4891 Unspecified atrial fibrillation: Secondary | ICD-10-CM | POA: Diagnosis not present

## 2013-07-01 DIAGNOSIS — N400 Enlarged prostate without lower urinary tract symptoms: Secondary | ICD-10-CM | POA: Diagnosis not present

## 2013-07-01 DIAGNOSIS — M109 Gout, unspecified: Secondary | ICD-10-CM

## 2013-07-01 HISTORY — DX: Encounter for general adult medical examination without abnormal findings: Z00.00

## 2013-07-01 LAB — CBC WITH DIFFERENTIAL/PLATELET
Basophils Relative: 0.5 % (ref 0.0–3.0)
Eosinophils Absolute: 0.2 10*3/uL (ref 0.0–0.7)
Eosinophils Relative: 2.2 % (ref 0.0–5.0)
HCT: 41.8 % (ref 39.0–52.0)
Hemoglobin: 13.9 g/dL (ref 13.0–17.0)
Lymphocytes Relative: 32.6 % (ref 12.0–46.0)
MCHC: 33.1 g/dL (ref 30.0–36.0)
Neutro Abs: 4.8 10*3/uL (ref 1.4–7.7)
Neutrophils Relative %: 57.3 % (ref 43.0–77.0)
RBC: 4.49 Mil/uL (ref 4.22–5.81)
RDW: 15.6 % — ABNORMAL HIGH (ref 11.5–14.6)
WBC: 8.4 10*3/uL (ref 4.5–10.5)

## 2013-07-01 LAB — BASIC METABOLIC PANEL: CO2: 31 mEq/L (ref 19–32)

## 2013-07-01 LAB — HEPATIC FUNCTION PANEL
Total Bilirubin: 0.7 mg/dL (ref 0.3–1.2)
Total Protein: 6.8 g/dL (ref 6.0–8.3)

## 2013-07-01 MED ORDER — FINASTERIDE 5 MG PO TABS: 5.0000 mg | ORAL_TABLET | Freq: Every day | ORAL | Status: DC

## 2013-07-01 NOTE — Assessment & Plan Note (Signed)
I have personally reviewed the Medicare Annual Wellness questionnaire and have noted 1. The patient's medical and social history 2. Their use of alcohol, tobacco or illicit drugs 3. Their current medications and supplements 4. The patient's functional ability including ADL's, fall risks, home safety risks and hearing or visual             impairment. 5. Diet and physical activities 6. Evidence for depression or mood disorders  The patients weight, height, BMI and visual acuity have been recorded in the chart I have made referrals, counseling and provided education to the patient based review of the above and I have provided the pt with a written personalized care plan for preventive services.  I have provided you with a copy of your personalized plan for preventive services. Please take the time to review along with your updated medication list.  Doing well Will get flu shot next month Not interested in zostavax

## 2013-07-01 NOTE — Assessment & Plan Note (Signed)
Good rate control without meds Just on aspirin since no cardiovascular risks other than age

## 2013-07-01 NOTE — Assessment & Plan Note (Signed)
Fair control on dual therapy Sees Dr Annabell Howells still

## 2013-07-01 NOTE — Progress Notes (Signed)
Subjective:    Patient ID: Matthew Howard, male    DOB: [birth year], 79 y.o.   MRN: [id number]  HPI Here for Medicare wellness visit and follow up Reviewed his form No falls No depression or anhedonia Only other doctor is Dr Annabell Howells (and for hearing aide) Poor hearing and uses aide---doesn't work that well Vision is okay No set exercise but stays busy with yard and house work Independent with instrumental ADLs but wife cooks No tobacco or alcohol Not sexually active Mild memory issues---mostly names. Never gets lost in car, doesn't repeat things, etc  No heart problems No palpitations or pain No DOE or dyspnea No edema Sleeps flat---no PND  Having a gout flare in the past few days Uses the cherry juice and advil Not quite gone Hasn't needed prescription for many years  Nocturia 2-3 per night Slow stream --but no major dribbling Some daytime urgency and frequency No incontinence  Current Outpatient Prescriptions on File Prior to Visit  Medication Sig Dispense Refill  . aspirin 81 MG EC tablet Take 81 mg by mouth daily.        . finasteride (PROSCAR) 5 MG tablet Take 1 tablet (5 mg total) by mouth daily.  90 tablet  3  . Tamsulosin HCl (FLOMAX) 0.4 MG CAPS Take 1 capsule (0.4 mg total) by mouth daily.  90 capsule  1   No current facility-administered medications on file prior to visit.    No Known Allergies  Past Medical History  Diagnosis Date  . Gout   . Hyperlipidemia   . Atrial flutter   . Benign prostatic hypertrophy     Past Surgical History  Procedure Laterality Date  . Cataract extraction    . Tonsillectomy      Family History  Problem Relation Age of Onset  . Cancer Mother   . Throat cancer Father     History   Social History  . Marital Status: Married    Spouse Name: Lydia Meng    Number of Children: 4  . Years of Education: N/A   Occupational History  . retired     [employer]   Social History Main Topics  . Smoking status:  Never Smoker   . Smokeless tobacco: Never Used  . Alcohol Use: Yes     Comment: rare etoh use  . Drug Use: Not on file  . Sexual Activity: Not on file   Other Topics Concern  . Not on file   Social History Narrative   No living will   No health care power of attorney--would want wife though   Would accept resuscitation but wouldn't want prolonged artificial ventilation   No tube feeds if cognitively unaware   Review of Systems Sleeps well in general Good appetite Weight stable Rare constipation-- rarely uses OTC laxative Occasionally has brief nosebleed from left nare---just with blowing nose, no real flow    Objective:   Physical Exam  Constitutional: He is oriented to person, place, and time. He appears well-developed and well-nourished. No distress.  HENT:  Mouth/Throat: Oropharynx is clear and moist. No oropharyngeal exudate.  No nasal lesions seen  Neck: Normal range of motion. Neck supple. No thyromegaly present.  Cardiovascular: Normal rate, normal heart sounds and intact distal pulses.  Exam reveals no gallop.   No murmur heard. irregular  Pulmonary/Chest: Effort normal and breath sounds normal. No respiratory distress. He has no wheezes. He has no rales.  Abdominal: Soft. There is no tenderness.  Musculoskeletal: He  exhibits no edema and no tenderness.  Lymphadenopathy:    He has no cervical adenopathy.  Neurological: He is alert and oriented to person, place, and time.  President-- "Obama, ?" [id number] or [phone] [id number] Recall 1/3  Skin: No rash noted.  Multiple benign keratoses  Psychiatric: He has a normal mood and affect. His behavior is normal.          Assessment & Plan:

## 2013-07-01 NOTE — Assessment & Plan Note (Signed)
Mild flare now but no findings Cherry juice and ibuprofen  If any worse, would try prn colchicine

## 2013-07-03 ENCOUNTER — Other Ambulatory Visit: Payer: Self-pay | Admitting: Internal Medicine

## 2013-07-10 ENCOUNTER — Encounter: Payer: Self-pay | Admitting: *Deleted

## 2013-07-15 DIAGNOSIS — Z23 Encounter for immunization: Secondary | ICD-10-CM | POA: Diagnosis not present

## 2013-07-17 ENCOUNTER — Other Ambulatory Visit: Payer: Self-pay | Admitting: *Deleted

## 2013-07-17 ENCOUNTER — Encounter: Payer: Self-pay | Admitting: Internal Medicine

## 2013-08-12 DIAGNOSIS — R404 Transient alteration of awareness: Secondary | ICD-10-CM | POA: Diagnosis not present

## 2013-08-12 DIAGNOSIS — R55 Syncope and collapse: Secondary | ICD-10-CM | POA: Diagnosis not present

## 2013-10-30 DIAGNOSIS — N138 Other obstructive and reflux uropathy: Secondary | ICD-10-CM | POA: Diagnosis not present

## 2013-10-30 DIAGNOSIS — R972 Elevated prostate specific antigen [PSA]: Secondary | ICD-10-CM | POA: Diagnosis not present

## 2013-11-04 DIAGNOSIS — R310 Gross hematuria: Secondary | ICD-10-CM | POA: Diagnosis not present

## 2013-11-04 DIAGNOSIS — N138 Other obstructive and reflux uropathy: Secondary | ICD-10-CM | POA: Diagnosis not present

## 2013-11-12 DIAGNOSIS — N200 Calculus of kidney: Secondary | ICD-10-CM | POA: Diagnosis not present

## 2013-11-12 DIAGNOSIS — R310 Gross hematuria: Secondary | ICD-10-CM | POA: Diagnosis not present

## 2013-11-23 DIAGNOSIS — N138 Other obstructive and reflux uropathy: Secondary | ICD-10-CM | POA: Diagnosis not present

## 2013-11-23 DIAGNOSIS — N403 Nodular prostate with lower urinary tract symptoms: Secondary | ICD-10-CM | POA: Diagnosis not present

## 2013-11-23 DIAGNOSIS — R310 Gross hematuria: Secondary | ICD-10-CM | POA: Diagnosis not present

## 2013-11-23 DIAGNOSIS — K4090 Unilateral inguinal hernia, without obstruction or gangrene, not specified as recurrent: Secondary | ICD-10-CM | POA: Diagnosis not present

## 2013-11-23 DIAGNOSIS — N200 Calculus of kidney: Secondary | ICD-10-CM | POA: Diagnosis not present

## 2014-01-22 ENCOUNTER — Ambulatory Visit (INDEPENDENT_AMBULATORY_CARE_PROVIDER_SITE_OTHER): Payer: Medicare Other | Admitting: Cardiology

## 2014-01-22 ENCOUNTER — Ambulatory Visit (INDEPENDENT_AMBULATORY_CARE_PROVIDER_SITE_OTHER): Payer: Medicare Other | Admitting: Internal Medicine

## 2014-01-22 ENCOUNTER — Encounter: Payer: Self-pay | Admitting: Cardiology

## 2014-01-22 ENCOUNTER — Encounter: Payer: Self-pay | Admitting: Internal Medicine

## 2014-01-22 ENCOUNTER — Other Ambulatory Visit: Payer: Self-pay | Admitting: *Deleted

## 2014-01-22 VITALS — BP 144/66 | HR 63 | Temp 97.6°F | Wt 192.0 lb

## 2014-01-22 VITALS — BP 122/56 | HR 61 | Ht 72.0 in | Wt 190.8 lb

## 2014-01-22 DIAGNOSIS — R42 Dizziness and giddiness: Secondary | ICD-10-CM

## 2014-01-22 DIAGNOSIS — I4430 Unspecified atrioventricular block: Secondary | ICD-10-CM

## 2014-01-22 DIAGNOSIS — I4891 Unspecified atrial fibrillation: Secondary | ICD-10-CM

## 2014-01-22 DIAGNOSIS — R404 Transient alteration of awareness: Secondary | ICD-10-CM | POA: Diagnosis not present

## 2014-01-22 DIAGNOSIS — R4020 Unspecified coma: Secondary | ICD-10-CM

## 2014-01-22 DIAGNOSIS — Z01812 Encounter for preprocedural laboratory examination: Secondary | ICD-10-CM | POA: Diagnosis not present

## 2014-01-22 DIAGNOSIS — R55 Syncope and collapse: Secondary | ICD-10-CM

## 2014-01-22 DIAGNOSIS — I459 Conduction disorder, unspecified: Secondary | ICD-10-CM

## 2014-01-22 LAB — CBC WITH DIFFERENTIAL/PLATELET
Basophils Absolute: 0 10*3/uL (ref 0.0–0.1)
Basophils Relative: 0.2 % (ref 0.0–3.0)
EOS ABS: 0.1 10*3/uL (ref 0.0–0.7)
Eosinophils Relative: 0.8 % (ref 0.0–5.0)
HCT: 40.9 % (ref 39.0–52.0)
HEMOGLOBIN: 13.7 g/dL (ref 13.0–17.0)
LYMPHS ABS: 2.7 10*3/uL (ref 0.7–4.0)
LYMPHS PCT: 32.6 % (ref 12.0–46.0)
MCHC: 33.4 g/dL (ref 30.0–36.0)
MCV: 95.1 fl (ref 78.0–100.0)
MONOS PCT: 10.5 % (ref 3.0–12.0)
Monocytes Absolute: 0.9 10*3/uL (ref 0.1–1.0)
NEUTROS ABS: 4.6 10*3/uL (ref 1.4–7.7)
Neutrophils Relative %: 55.9 % (ref 43.0–77.0)
PLATELETS: 225 10*3/uL (ref 150.0–400.0)
RBC: 4.3 Mil/uL (ref 4.22–5.81)
RDW: 16 % — ABNORMAL HIGH (ref 11.5–14.6)
WBC: 8.2 10*3/uL (ref 4.5–10.5)

## 2014-01-22 LAB — BASIC METABOLIC PANEL WITH GFR
BUN: 17 mg/dL (ref 6–23)
CO2: 30 meq/L (ref 19–32)
Calcium: 9.4 mg/dL (ref 8.4–10.5)
Chloride: 105 meq/L (ref 96–112)
Creatinine, Ser: 1.1 mg/dL (ref 0.4–1.5)
GFR: 70.13 mL/min
Glucose, Bld: 81 mg/dL (ref 70–99)
Potassium: 4.1 meq/L (ref 3.5–5.1)
Sodium: 142 meq/L (ref 135–145)

## 2014-01-22 NOTE — Progress Notes (Signed)
Pre visit review using our clinic review tool, if applicable. No additional management support is needed unless otherwise documented below in the visit note. 

## 2014-01-22 NOTE — Patient Instructions (Addendum)
Your physician recommends that you continue on your current medications as directed. Please refer to the Current Medication list given to you today.  Your physician recommends that you have labs today: BMET, CBC  Your physician has recommended that you have a pacemaker inserted. A pacemaker is a small device that is placed under the skin of your chest or abdomen to help control abnormal heart rhythms. This device uses electrical pulses to prompt the heart to beat at a normal rate. Pacemakers are used to treat heart rhythms that are too slow. Wire (leads) are attached to the pacemaker that goes into the chambers of you heart. This is done in the hospital and usually requires and overnight stay. Please see the instruction sheet given to you today for more information. Date: 01/25/2014 @ 12:00 pm.  Your physician recommends that you schedule a follow-up appointment in: 3 months with Dr. Anne FuSkains.

## 2014-01-22 NOTE — Progress Notes (Signed)
[address] [place], [place]  [address] Phone: [phone] Fax:  [phone]) [phone]  Date:  01/22/2014   ID:  Matthew Howard, DOB [birth year], MRN [id number]  PCP:  Tillman Abideichard Letvak, MD   History of Present Illness: Matthew Howard is a 80 y.o. male here for the evaluation of syncope. First one in chuch happened when just stood up to sing. 2nd time at home in kitchen standing. Suddenly felt like needed to sit down. Fell on floor in front of wife. Syncope. Brief. 3rd time seeing wife in hospital. Standing, no chair, felt like he was going out. Few seconds. After episodes water seems to help. Had terminated prior episodes if he can sit quickly. Recently increased frequency of urination. Has seen Dr. Graciela HusbandsKlein in the past per report. He reports these episodes happened 3 times over the last few months. He has a sudden onset sensation of the need to sit down, he then blacks out and wakes up a few seconds later. Denies any chest pain, palpitations or shortness of breath. History of atrial fibrillation on records but now in sinus, rate controlled, no beta blocker. He is not currently on anticoagulation. EKG personally reviewed from 01/22/14 demonstrates what appears to be 2-1 AV nodal conduction bradycardia rate 49.  Orthostatics done in clinic demonstrated a 131/60 when laying, heart rate 49 beats per minute, 135/48 when sitting, heart rate 43 beats per minute, 129/62 when standing heart rate 75 beats per minute, after 3 minutes 122/56-61.    Wt Readings from Last 3 Encounters:  01/22/14 190 lb 12.8 oz (86.546 kg)  01/22/14 192 lb (87.091 kg)  07/01/13 190 lb 12.8 oz (86.546 kg)     Past Medical History  Diagnosis Date  . Gout   . Hyperlipidemia   . Atrial flutter   . Benign prostatic hypertrophy     Past Surgical History  Procedure Laterality Date  . Cataract extraction    . Tonsillectomy      Current Outpatient Prescriptions  Medication Sig Dispense Refill  . aspirin  81 MG EC tablet Take 81 mg by mouth daily.        . finasteride (PROSCAR) 5 MG tablet TAKE 1 TABLET EVERY DAY  90 tablet  3  . Tamsulosin HCl (FLOMAX) 0.4 MG CAPS Take 1 capsule (0.4 mg total) by mouth daily.  90 capsule  1   No current facility-administered medications for this visit.    Allergies:   No Known Allergies  Social History:  The patient  reports that he has never smoked. He has never used smokeless tobacco. He reports that he drinks alcohol.   Family History  Problem Relation Age of Onset  . Cancer Mother   . Throat cancer Father     ROS:  Please see the history of present illness.   No chest pain, no strokelike symptoms, no paresthesias, no joint pain.   All other systems reviewed and negative.   PHYSICAL EXAM: VS:  BP 129/62  Pulse 75  Ht 6' (1.829 m)  Wt 190 lb 12.8 oz (86.546 kg)  BMI 25.87 kg/m2 Well nourished, well developed, in no acute distressElderly HEENT: normal, [place]/AT, EOMI Neck: no JVD, normal carotid upstroke, no bruit Cardiac:  normal S1, S2; brady with ectopy; no murmur Lungs:  clear to auscultation bilaterally, no wheezing, rhonchi or rales Abd: soft, nontender, no hepatomegaly, no bruits Ext: trace ankle edema, 2+ distal pulses Skin: warm and dry GU: deferred Neuro:  no focal abnormalities noted, AAO x 3  EKG: As above   Labs: TSH normal 9/14.  ASSESSMENT AND PLAN:  1. Syncope-concerning for possible arrhythmic origin given his conduction disorder, first degree AV block, as well as non conducted P waves in 2:1 fashion. I'm concerned given his history that his syncope is arrhythmic in origin secondary to his conduction disorder. Spoke to Dr. Hillis RangeJames Allred. Related to Dr. Lewayne BuntingGregg Taylor. We will proceed with pacemaker on Monday. He will come to the hospital at noon. Nothing to eat or drink after midnight. I offered him admission to the hospital and he respectfully declined. I told him no driving. If he has another episode of syncope, 911. We will check  bmet and CBC.  Signed, Donato SchultzMark Skains, MD [facility]  01/22/2014 2:23 PM

## 2014-01-22 NOTE — Progress Notes (Signed)
Subjective:    Patient ID: Matthew Howard, male    DOB: 11/19/1918, 80 y.o.   MRThreasa Beards: [id number]  HPI  Pt presents to the clinic today with c/o dizziness, syncope and loss of consciousness. He reports this has happened 3 times over the last couple of months, last episode yesterday. All of a sudden he feels like he needs to his sit down, he blacks out and then wakes up a few seconds later. He denies chest pain, chest tightness, palpitations or shortness of breath. He does not feel dehydrated but does report that he urinates a lot. He has a known afib, only anticoagulation is aspirin, rate controlled so not on a beta blocker or antiarrhythmic medication. He does have a cardiologist, Dr. Graciela HusbandsKlein, but has not seen him since 2012.  Review of Systems      Past Medical History  Diagnosis Date  . Gout   . Hyperlipidemia   . Atrial flutter   . Benign prostatic hypertrophy     Current Outpatient Prescriptions  Medication Sig Dispense Refill  . aspirin 81 MG EC tablet Take 81 mg by mouth daily.        . finasteride (PROSCAR) 5 MG tablet TAKE 1 TABLET EVERY DAY  90 tablet  3  . Tamsulosin HCl (FLOMAX) 0.4 MG CAPS Take 1 capsule (0.4 mg total) by mouth daily.  90 capsule  1   No current facility-administered medications for this visit.    No Known Allergies  Family History  Problem Relation Age of Onset  . Cancer Mother   . Throat cancer Father     History   Social History  . Marital Status: Married    Spouse Name: Philomena CourseLola Wismer    Number of Children: 4  . Years of Education: N/A   Occupational History  . retired     [employer]   Social History Main Topics  . Smoking status: Never Smoker   . Smokeless tobacco: Never Used  . Alcohol Use: Yes     Comment: rare etoh use  . Drug Use: Not on file  . Sexual Activity: Not on file   Other Topics Concern  . Not on file   Social History Narrative   No living will   No health care power of attorney--would want wife though   Would accept resuscitation but wouldn't want prolonged artificial ventilation   No tube feeds if cognitively unaware     Constitutional: Denies fever, malaise, fatigue, headache or abrupt weight changes.  Respiratory: Denies difficulty breathing, shortness of breath, cough or sputum production.   Cardiovascular: Denies chest pain, chest tightness, palpitations or swelling in the hands or feet.   Neurological: Pt reports dizziness. Denies difficulty with memory, difficulty with speech or problems with balance and coordination.   No other specific complaints in a complete review of systems (except as listed in HPI above).  Objective:   Physical Exam   BP 144/66  Pulse 63  Temp(Src) 97.6 F (36.4 C) (Oral)  Wt 192 lb (87.091 kg)  SpO2 95% Wt Readings from Last 3 Encounters:  01/22/14 192 lb (87.091 kg)  07/01/13 190 lb 12.8 oz (86.546 kg)  11/12/12 199 lb (90.266 kg)    General: Appears his stated age, well developed, well nourished in NAD. Cardiovascular: Irregular rate and rhythm.  No murmur, rubs or gallops noted. No JVD or BLE edema. No carotid bruits noted. Pulmonary/Chest: Normal effort and positive vesicular breath sounds. No respiratory distress. No wheezes, rales  or ronchi noted.  Neurological: Alert and oriented. Coordination normal. +DTRs bilaterally.   BMET    Component Value Date/Time   NA 141 07/01/2013 1023   K 4.0 07/01/2013 1023   CL 105 07/01/2013 1023   CO2 31 07/01/2013 1023   GLUCOSE 83 07/01/2013 1023   BUN 18 07/01/2013 1023   CREATININE 1.1 07/01/2013 1023   CALCIUM 9.0 07/01/2013 1023   GFRNONAA 74.87 01/06/2010 0000   GFRAA 81 11/01/2008 1534    Lipid Panel     Component Value Date/Time   CHOL 141 11/01/2008 1534   TRIG 104 11/01/2008 1534   HDL 43.4 11/01/2008 1534   CHOLHDL 3.2 CALC 11/01/2008 1534   VLDL 21 11/01/2008 1534   LDLCALC 77 11/01/2008 1534    CBC    Component Value Date/Time   WBC 8.4 07/01/2013 1023   RBC 4.49 07/01/2013 1023   HGB  13.9 07/01/2013 1023   HCT 41.8 07/01/2013 1023   PLT 196.0 07/01/2013 1023   MCV 93.1 07/01/2013 1023   MCHC 33.1 07/01/2013 1023   RDW 15.6* 07/01/2013 1023   LYMPHSABS 2.8 07/01/2013 1023   MONOABS 0.6 07/01/2013 1023   EOSABS 0.2 07/01/2013 1023   BASOSABS 0.0 07/01/2013 1023    Hgb A1C No results found for this basename: HGBA1C      Assessment & Plan:   Dizziness, syncope with loss of consciousness:  ECG today: New onset AV block- 1st degree Hr: 49 Qtc: 469 Have consulted Cardiology and set up appointment for today 01/22/14 Discussed with daughter  Follow up with PCP after visit with cardiology

## 2014-01-22 NOTE — Patient Instructions (Addendum)
Syncope  Syncope is a fainting spell. This means the person loses consciousness and drops to the ground. The person is generally unconscious for less than 5 minutes. The person may have some muscle twitches for up to 15 seconds before waking up and returning to normal. Syncope occurs more often in elderly people, but it can happen to anyone. While most causes of syncope are not dangerous, syncope can be a sign of a serious medical problem. It is important to seek medical care.   CAUSES   Syncope is caused by a sudden decrease in blood flow to the brain. The specific cause is often not determined. Factors that can trigger syncope include:   Taking medicines that lower blood pressure.   Sudden changes in posture, such as standing up suddenly.   Taking more medicine than prescribed.   Standing in one place for too long.   Seizure disorders.   Dehydration and excessive exposure to heat.   Low blood sugar (hypoglycemia).   Straining to have a bowel movement.   Heart disease, irregular heartbeat, or other circulatory problems.   Fear, emotional distress, seeing blood, or severe pain.  SYMPTOMS   Right before fainting, you may:   Feel dizzy or lightheaded.   Feel nauseous.   See all white or all black in your field of vision.   Have cold, clammy skin.  DIAGNOSIS   Your caregiver will ask about your symptoms, perform a physical exam, and perform electrocardiography (ECG) to record the electrical activity of your heart. Your caregiver may also perform other heart or blood tests to determine the cause of your syncope.  TREATMENT   In most cases, no treatment is needed. Depending on the cause of your syncope, your caregiver may recommend changing or stopping some of your medicines.  HOME CARE INSTRUCTIONS   Have someone stay with you until you feel stable.   Do not drive, operate machinery, or play sports until your caregiver says it is okay.   Keep all follow-up appointments as directed by your  caregiver.   Lie down right away if you start feeling like you might faint. Breathe deeply and steadily. Wait until all the symptoms have passed.   Drink enough fluids to keep your urine clear or pale yellow.   If you are taking blood pressure or heart medicine, get up slowly, taking several minutes to sit and then stand. This can reduce dizziness.  SEEK IMMEDIATE MEDICAL CARE IF:    You have a severe headache.   You have unusual pain in the chest, abdomen, or back.   You are bleeding from the mouth or rectum, or you have black or tarry stool.   You have an irregular or very fast heartbeat.   You have pain with breathing.   You have repeated fainting or seizure-like jerking during an episode.   You faint when sitting or lying down.   You have confusion.   You have difficulty walking.   You have severe weakness.   You have vision problems.  If you fainted, call your local emergency services (911 in [place] Do not drive yourself to the hospital.   MAKE SURE YOU:   Understand these instructions.   Will watch your condition.   Will get help right away if you are not doing well or get worse.  Document Released: 09/24/2005 Document Revised: 03/25/2012 Document Reviewed: 11/23/2011  Exit[employer] Patient Information 2014 [employer].

## 2014-01-24 DIAGNOSIS — I4891 Unspecified atrial fibrillation: Secondary | ICD-10-CM | POA: Diagnosis not present

## 2014-01-24 DIAGNOSIS — I498 Other specified cardiac arrhythmias: Secondary | ICD-10-CM | POA: Diagnosis not present

## 2014-01-24 DIAGNOSIS — I4892 Unspecified atrial flutter: Secondary | ICD-10-CM | POA: Diagnosis not present

## 2014-01-24 DIAGNOSIS — I10 Essential (primary) hypertension: Secondary | ICD-10-CM | POA: Diagnosis not present

## 2014-01-24 DIAGNOSIS — M109 Gout, unspecified: Secondary | ICD-10-CM | POA: Diagnosis not present

## 2014-01-24 DIAGNOSIS — E785 Hyperlipidemia, unspecified: Secondary | ICD-10-CM | POA: Diagnosis not present

## 2014-01-24 DIAGNOSIS — R350 Frequency of micturition: Secondary | ICD-10-CM | POA: Diagnosis not present

## 2014-01-24 DIAGNOSIS — N400 Enlarged prostate without lower urinary tract symptoms: Secondary | ICD-10-CM | POA: Diagnosis not present

## 2014-01-24 DIAGNOSIS — I441 Atrioventricular block, second degree: Secondary | ICD-10-CM | POA: Diagnosis not present

## 2014-01-24 DIAGNOSIS — I4510 Unspecified right bundle-branch block: Secondary | ICD-10-CM | POA: Diagnosis not present

## 2014-01-24 DIAGNOSIS — Z7982 Long term (current) use of aspirin: Secondary | ICD-10-CM | POA: Diagnosis not present

## 2014-01-24 MED ORDER — SODIUM CHLORIDE 0.9 % IV SOLN
INTRAVENOUS | Status: DC
  Administered 2014-01-25: 12:00:00 via INTRAVENOUS

## 2014-01-24 MED ORDER — SODIUM CHLORIDE 0.9 % IR SOLN
80.0000 mg | Status: DC
  Filled 2014-01-24: qty 2

## 2014-01-24 MED ORDER — CEFAZOLIN SODIUM-DEXTROSE 2-3 GM-% IV SOLR
2.0000 g | INTRAVENOUS | Status: DC
  Filled 2014-01-24: qty 50

## 2014-01-25 ENCOUNTER — Encounter (HOSPITAL_COMMUNITY)
Admission: RE | Disposition: A | Discharge status: Home / Self Care | Payer: Self-pay | Source: Ambulatory Visit | Attending: Internal Medicine

## 2014-01-25 ENCOUNTER — Ambulatory Visit (HOSPITAL_COMMUNITY)
Admission: RE | Admit: 2014-01-25 | Discharge: 2014-01-26 | Disposition: A | Discharge status: Home / Self Care | Payer: Medicare Other | Source: Ambulatory Visit | Attending: Internal Medicine | Admitting: Internal Medicine

## 2014-01-25 ENCOUNTER — Telehealth: Payer: Self-pay | Admitting: Internal Medicine

## 2014-01-25 ENCOUNTER — Encounter: Payer: Self-pay | Admitting: Internal Medicine

## 2014-01-25 DIAGNOSIS — E785 Hyperlipidemia, unspecified: Secondary | ICD-10-CM | POA: Diagnosis not present

## 2014-01-25 DIAGNOSIS — I441 Atrioventricular block, second degree: Secondary | ICD-10-CM | POA: Insufficient documentation

## 2014-01-25 DIAGNOSIS — I4891 Unspecified atrial fibrillation: Secondary | ICD-10-CM | POA: Insufficient documentation

## 2014-01-25 DIAGNOSIS — I4510 Unspecified right bundle-branch block: Secondary | ICD-10-CM | POA: Insufficient documentation

## 2014-01-25 DIAGNOSIS — R350 Frequency of micturition: Secondary | ICD-10-CM | POA: Insufficient documentation

## 2014-01-25 DIAGNOSIS — N400 Enlarged prostate without lower urinary tract symptoms: Secondary | ICD-10-CM | POA: Diagnosis not present

## 2014-01-25 DIAGNOSIS — I498 Other specified cardiac arrhythmias: Secondary | ICD-10-CM | POA: Diagnosis not present

## 2014-01-25 DIAGNOSIS — M109 Gout, unspecified: Secondary | ICD-10-CM | POA: Diagnosis not present

## 2014-01-25 DIAGNOSIS — I10 Essential (primary) hypertension: Secondary | ICD-10-CM | POA: Insufficient documentation

## 2014-01-25 DIAGNOSIS — I459 Conduction disorder, unspecified: Secondary | ICD-10-CM

## 2014-01-25 DIAGNOSIS — I4892 Unspecified atrial flutter: Secondary | ICD-10-CM | POA: Insufficient documentation

## 2014-01-25 DIAGNOSIS — Z7982 Long term (current) use of aspirin: Secondary | ICD-10-CM | POA: Insufficient documentation

## 2014-01-25 HISTORY — DX: Atrioventricular block, second degree: I44.1

## 2014-01-25 HISTORY — PX: PACEMAKER INSERTION: SHX728

## 2014-01-25 HISTORY — PX: PERMANENT PACEMAKER INSERTION: SHX5480

## 2014-01-25 HISTORY — DX: Bradycardia, unspecified: R00.1

## 2014-01-25 LAB — SURGICAL PCR SCREEN
MRSA, PCR: NEGATIVE
STAPHYLOCOCCUS AUREUS: NEGATIVE

## 2014-01-25 SURGERY — PERMANENT PACEMAKER INSERTION
Anesthesia: LOCAL

## 2014-01-25 MED ORDER — MUPIROCIN 2 % EX OINT
TOPICAL_OINTMENT | CUTANEOUS | Status: AC
  Filled 2014-01-25: qty 22

## 2014-01-25 MED ORDER — LIDOCAINE HCL (PF) 1 % IJ SOLN
INTRAMUSCULAR | Status: AC
  Filled 2014-01-25: qty 60

## 2014-01-25 MED ORDER — ACETAMINOPHEN 325 MG PO TABS: 325.0000 mg | ORAL_TABLET | ORAL | Status: DC | PRN

## 2014-01-25 MED ORDER — ASPIRIN EC 81 MG PO TBEC
81.0000 mg | DELAYED_RELEASE_TABLET | Freq: Every day | ORAL | Status: DC
  Administered 2014-01-26: 81 mg via ORAL
  Filled 2014-01-25: qty 1

## 2014-01-25 MED ORDER — FINASTERIDE 5 MG PO TABS
5.0000 mg | ORAL_TABLET | Freq: Every day | ORAL | Status: DC
  Administered 2014-01-26: 5 mg via ORAL
  Filled 2014-01-25: qty 1

## 2014-01-25 MED ORDER — ONDANSETRON HCL 4 MG/2ML IJ SOLN: 4.0000 mg | Freq: Four times a day (QID) | INTRAMUSCULAR | Status: DC | PRN

## 2014-01-25 MED ORDER — CEFAZOLIN SODIUM 1-5 GM-% IV SOLN
1.0000 g | Freq: Four times a day (QID) | INTRAVENOUS | Status: AC
  Administered 2014-01-25 – 2014-01-26 (×2): 1 g via INTRAVENOUS
  Filled 2014-01-25 (×2): qty 50

## 2014-01-25 MED ORDER — FENTANYL CITRATE 0.05 MG/ML IJ SOLN
INTRAMUSCULAR | Status: AC
  Filled 2014-01-25: qty 2

## 2014-01-25 MED ORDER — MIDAZOLAM HCL 5 MG/5ML IJ SOLN
INTRAMUSCULAR | Status: AC
  Filled 2014-01-25: qty 5

## 2014-01-25 MED ORDER — CHLORHEXIDINE GLUCONATE 4 % EX LIQD
60.0000 mL | Freq: Once | CUTANEOUS | Status: DC
  Filled 2014-01-25: qty 60

## 2014-01-25 MED ORDER — CEFAZOLIN SODIUM 1-5 GM-% IV SOLN
1.0000 g | Freq: Four times a day (QID) | INTRAVENOUS | Status: DC
  Filled 2014-01-25 (×3): qty 50

## 2014-01-25 MED ORDER — TAMSULOSIN HCL 0.4 MG PO CAPS
0.4000 mg | ORAL_CAPSULE | Freq: Every day | ORAL | Status: DC
  Administered 2014-01-26: 0.4 mg via ORAL
  Filled 2014-01-25: qty 1

## 2014-01-25 MED ORDER — MUPIROCIN 2 % EX OINT
TOPICAL_OINTMENT | Freq: Two times a day (BID) | CUTANEOUS | Status: DC
  Administered 2014-01-25: 12:00:00 via NASAL
  Filled 2014-01-25: qty 22

## 2014-01-25 MED ORDER — ASPIRIN 81 MG PO TBEC: 81.0000 mg | DELAYED_RELEASE_TABLET | Freq: Every day | ORAL | Status: DC

## 2014-01-25 NOTE — Progress Notes (Signed)
UR Completed Masen Luallen Graves-Bigelow, RN,BSN [phone]  

## 2014-01-25 NOTE — Discharge Instructions (Signed)
° °  Supplemental Discharge Instructions for  Pacemaker/Defibrillator Patients  Activity No heavy lifting or vigorous activity with your left/right arm for 6 to 8 weeks.  Do not raise your left/right arm above your head for one week.  Gradually raise your affected arm as drawn below.           04/24                      04/25                       04/26                      04/27       NO DRIVING for 6 months until given clearance by Dr. Ladona Ridgelaylor. WOUND CARE   Keep the wound area clean and dry.  Do not get this area wet for one week. No showers for one week; you may shower on 02/02/2014.   The tape/steri-strips on your wound will fall off; do not pull them off.  No bandage is needed on the site.  DO NOT apply any creams, oils, or ointments to the wound area.   If you notice any drainage or discharge from the wound, any swelling or bruising at the site, or you develop a fever > 101? F after you are discharged home, call the office at once.  Special Instructions   You are still able to use cellular telephones; use the ear opposite the side where you have your pacemaker/defibrillator.  Avoid carrying your cellular phone near your device.   When traveling through airports, show security personnel your identification card to avoid being screened in the metal detectors.  Ask the security personnel to use the hand wand.   Avoid arc welding equipment, MRI testing (magnetic resonance imaging), TENS units (transcutaneous nerve stimulators).  Call the office for questions about other devices.   Avoid electrical appliances that are in poor condition or are not properly grounded.   Microwave ovens are safe to be near or to operate.

## 2014-01-25 NOTE — Telephone Encounter (Signed)
Pt's daughter, Cordelia PenSherry wanted to let you know [name] is having a [facility]. Also, pt's wife passed away yesterday around 2:15 p.m.  Thank you.

## 2014-01-25 NOTE — Telephone Encounter (Signed)
Sorry to hear that I will try to reach him in the next few days to check on him

## 2014-01-25 NOTE — H&P (Signed)
Matthew Howard  01/22/2014 2:00 PM   Office Visit  MRN:  [id number]   Description: 80 year old male  Provider: Donato SchultzMark Skains, MD  Department: [address]        Referring Provider    Matthew Schwalbeichard I Letvak, MD      Diagnoses    Syncope    -  Primary    780.2    Atrial fibrillation        427.31    Pre-procedure lab exam        V72.63    AVB (atrioventricular block)        426.10                [address] [place], [place]  [address] Phone: [phone] Fax:  [phone]   Date:  01/22/2014    ID:  Matthew Howard, DOB 1919/05/10, MRN [id number]   PCP:  Matthew Abideichard Letvak, MD         History of Present Illness: Matthew Howard is a 80 y.o. male here for the evaluation of syncope. First one in chuch happened when just stood up to sing. 2nd time at home in kitchen standing. Suddenly felt like needed to sit down. Fell on floor in front of wife. Syncope. Brief. 3rd time seeing wife in hospital. Standing, no chair, felt like he was going out. Few seconds. After episodes water seems to help. Had terminated prior episodes if he can sit quickly. Recently increased frequency of urination. Has seen Dr. Graciela HusbandsKlein in the past per report. He reports these episodes happened 3 times over the last few months. He has a sudden onset sensation of the need to sit down, he then blacks out and wakes up a few seconds later. Denies any chest pain, palpitations or shortness of breath. History of atrial fibrillation on records but now in sinus, rate controlled, no beta blocker. He is not currently on anticoagulation. EKG personally reviewed from 01/22/14 demonstrates what appears to be 2-1 AV nodal conduction bradycardia rate 49.   Orthostatics done in clinic demonstrated a 131/60 when laying, heart rate 49 beats per minute, 135/48 when sitting, heart rate 43 beats per minute, 129/62 when standing heart rate 75 beats per minute, after 3 minutes 122/56-61.        Wt Readings  from Last 3 Encounters:   01/22/14  190 lb 12.8 oz (86.546 kg)   01/22/14  192 lb (87.091 kg)   07/01/13  190 lb 12.8 oz (86.546 kg)         Past Medical History   Diagnosis  Date   .  Gout     .  Hyperlipidemia     .  Atrial flutter     .  Benign prostatic hypertrophy           Past Surgical History   Procedure  Laterality  Date   .  Cataract extraction       .  Tonsillectomy             Current Outpatient Prescriptions   Medication  Sig  Dispense  Refill   .  aspirin 81 MG EC tablet  Take 81 mg by mouth daily.           .  finasteride (PROSCAR) 5 MG tablet  TAKE 1 TABLET EVERY DAY   90 tablet   3   .  Tamsulosin HCl (FLOMAX) 0.4 MG CAPS  Take 1 capsule (  0.4 mg total) by mouth daily.   90 capsule   1       No current facility-administered medications for this visit.        Allergies:   No Known Allergies   Social History:  The patient  reports that he has never smoked. He has never used smokeless tobacco. He reports that he drinks alcohol.     Family History   Problem  Relation  Age of Onset   .  Cancer  Mother     .  Throat cancer  Father          ROS:  Please see the history of present illness.   No chest pain, no strokelike symptoms, no paresthesias, no joint pain.   All other systems reviewed and negative.     PHYSICAL EXAM: VS:  BP 129/62  Pulse 75  Ht 6' (1.829 m)  Wt 190 lb 12.8 oz (86.546 kg)  BMI 25.87 kg/m2 Well nourished, well developed, in no acute distress Elderly HEENT: normal, [place]/AT, EOMI Neck: no JVD, normal carotid upstroke, no bruit Cardiac:  normal S1, S2; brady with ectopy; no murmur  Lungs:  clear to auscultation bilaterally, no wheezing, rhonchi or rales  Abd: soft, nontender, no hepatomegaly, no bruits  Ext: trace ankle edema, 2+ distal pulses Skin: warm and dry  GU: deferred Neuro: no focal abnormalities noted, AAO x 3   EKG: As above    Labs: TSH normal 9/14.   ASSESSMENT AND PLAN:    1. Syncope- secondary to high  grade AV block 2:1 and 3:1 2. HTN 3. RBBB Rec: I have discussed the treatment options with the patient and the risks/benefits/goals/expectations of PPM insertion have been discussed with the patient and he wishes to proceed.  Leonia ReevesGregg Yasaman Kolek,M.D.

## 2014-01-26 ENCOUNTER — Encounter (HOSPITAL_COMMUNITY): Payer: Self-pay | Admitting: General Practice

## 2014-01-26 ENCOUNTER — Ambulatory Visit (HOSPITAL_COMMUNITY): Payer: Medicare Other

## 2014-01-26 DIAGNOSIS — I498 Other specified cardiac arrhythmias: Secondary | ICD-10-CM | POA: Diagnosis not present

## 2014-01-26 DIAGNOSIS — M109 Gout, unspecified: Secondary | ICD-10-CM | POA: Diagnosis not present

## 2014-01-26 DIAGNOSIS — E785 Hyperlipidemia, unspecified: Secondary | ICD-10-CM | POA: Diagnosis not present

## 2014-01-26 DIAGNOSIS — N400 Enlarged prostate without lower urinary tract symptoms: Secondary | ICD-10-CM | POA: Diagnosis not present

## 2014-01-26 DIAGNOSIS — R0989 Other specified symptoms and signs involving the circulatory and respiratory systems: Secondary | ICD-10-CM | POA: Diagnosis not present

## 2014-01-26 DIAGNOSIS — Z95818 Presence of other cardiac implants and grafts: Secondary | ICD-10-CM | POA: Diagnosis not present

## 2014-01-26 DIAGNOSIS — I441 Atrioventricular block, second degree: Secondary | ICD-10-CM | POA: Diagnosis not present

## 2014-01-26 DIAGNOSIS — R310 Gross hematuria: Secondary | ICD-10-CM | POA: Diagnosis not present

## 2014-01-26 DIAGNOSIS — I459 Conduction disorder, unspecified: Secondary | ICD-10-CM

## 2014-01-26 DIAGNOSIS — R350 Frequency of micturition: Secondary | ICD-10-CM | POA: Diagnosis not present

## 2014-01-26 NOTE — Discharge Summary (Signed)
ELECTROPHYSIOLOGY PROCEDURE DISCHARGE SUMMARY    Patient ID: Matthew Howard,  MRN: [id number], DOB/AGE: 1918/10/27 80 y.o.  Admit date: 01/25/2014 Discharge date: 01/26/2014  Primary Care Physician: Tillman Abideichard Letvak, MD Primary Cardiologist: Anne FuSkains Electrophysiologist: Ladona Ridgelaylor  Primary Discharge Diagnosis:  Symptomatic bradycardia status post pacemaker implantation this admission  Secondary Discharge Diagnosis:  1.  Hyperlipidemia 2.  BPH 3.  Gout  No Known Allergies   Procedures This Admission:  1.  Implantation of a dual chamber pacemaker on 01-25-14 by Dr Ladona Ridgelaylor.  The patient received a STJ dual chamber pacemaker.  See op note for full details. 2.  CXR on 01-26-14 demonstrated no pnuemothorax status post device implant.   Brief HPI: Matthew Howard is a 80 y.o. Male referred for syncope evaluation. First one in chuch happened when just stood up to sing. 2nd time at home in kitchen standing. Suddenly felt like needed to sit down. Fell on floor in front of wife. Syncope. Brief. 3rd time seeing wife in hospital. Standing, no chair, felt like he was going out. Few seconds. After episodes water seems to help. Had terminated prior episodes if he can sit quickly. Recently increased frequency of urination. Has seen Dr. Graciela HusbandsKlein in the past per report. He reports these episodes happened 3 times over the last few months. He has a sudden onset sensation of the need to sit down, he then blacks out and wakes up a few seconds later. Denies any chest pain, palpitations or shortness of breath. History of atrial fibrillation on records but now in sinus, rate controlled, no beta blocker. He is not currently on anticoagulation. EKG from 01/22/14 demonstrates what appears to be 2-1 AV nodal conduction bradycardia rate 49. He was referred to cardiology for further evaluation who recommended pacemaker implantation. Risks, benefits, and alternatives were reviewed with the patient who wished to proceed.    Hospital Course:  The patient was admitted and underwent implantation of a STJ dual chamber pacemaker with details as outlined above.   He was monitored on telemetry overnight which demonstrated sinus rhythm with Mobitz I heart block.  Left chest was without hematoma or ecchymosis.  The device was interrogated and found to be functioning normally.  CXR was obtained and demonstrated no pneumothorax status post device implantation.  Wound care, arm mobility, and restrictions were reviewed with the patient.  Dr Ladona Ridgelaylor examined the patient and considered them stable for discharge to home.    Discharge Vitals: Blood pressure 142/73, pulse 96, temperature 97.2 F (36.2 C), temperature source Oral, resp. rate 20, height 5' 11.5" (1.816 m), weight 190 lb 2.1 oz (86.244 kg), SpO2 97.00%.    Labs:   Lab Results  Component Value Date   WBC 8.2 01/22/2014   HGB 13.7 01/22/2014   HCT 40.9 01/22/2014   MCV 95.1 01/22/2014   PLT 225.0 01/22/2014    Recent Labs Lab 01/22/14 1539  NA 142  K 4.1  CL 105  CO2 30  BUN 17  CREATININE 1.1  CALCIUM 9.4  GLUCOSE 81     Discharge Medications:    Medication List    ASK your doctor about these medications       aspirin 81 MG EC tablet  Take 81 mg by mouth daily.     finasteride 5 MG tablet  Commonly known as:  PROSCAR  Take 5 mg by mouth daily.     tamsulosin 0.4 MG Caps capsule  Commonly known as:  FLOMAX  Take 1 capsule (  0.4 mg total) by mouth daily.        Disposition:       Future Appointments Provider Department Dept Phone   02/04/2014 11:00 AM [facility] [phone]   04/29/2014 3:45 PM Marinus MawGregg W Taylor, MD [facility] [address] [facility] [phone]   07/07/2014 9:30 AM Karie Schwalbeichard I Letvak, MD [facility] [phone]     Follow-up Information   [facility] [address] [facility] On 02/04/2014. (At 11:00 AM for wound check)    Specialty:  Cardiology    Contact information:   [address], [address] [place] [address] [phone]      Follow up with Lewayne BuntingGregg Taylor, MD On 04/29/2014. (At 3:45 PM)    Specialty:  Cardiology   Contact information:   [address] [place] [address] [phone]       Duration of Discharge Encounter: Greater than 30 minutes including physician time.  Signed,

## 2014-01-26 NOTE — CV Procedure (Signed)
Electrophysiology procedure note  Procedure: Insertion of a dual-chamber pacemaker  Indication: Symptomatic high-grade A-V block, with syncope  Description of the procedure: After informed consent was obtained, the patient was taken to the diagnostic electrophysiology laboratory in the fasting state. After the usual preparation and draping, intravenous Versed and fentanyl were utilized for sedation. 30 cc of lidocaine was infiltrated into the left infraclavicular region. A 5 cm incision was carried out over this region. Electrocautery was utilized to dissect down to the fascial plane. The left subclavian vein was punctured twice. A St. Jude model 1688, 58 cm active-fixation lead, serial number [id number] was advanced into the right ventricle. A St. Jude model 1688, 52 cm active-fixation lead, serial [id number] was advanced into the right atrium. Mapping was first carried out in the right ventricle. At the final site on the right ventricular apical septum, the R waves measured 10 mV. With the lead the patient threshold 0.7 V at 0.4 ms. The patient impedance was 575 ohms. 10 V pacing did not stimulate the diaphragm. With the ventricular lead in satisfactory position, attention was then turned to placement of the atrial lead. The atrial lead was placed in the anterolateral portion of the right atrium. The P waves measured 4 mV. The pacing threshold was 0.5 V at 0.4 ms. The pacing impedance was 472 ohms. 10 V pacing did not stimulate the diaphragm. The leads were secured to the fascial plane with silk suture. The sewing sleeves were secured with silk suture. Electrocautery was utilized to make a subcutaneous pocket. Electrocautery was utilized to assure hemostasis. The pocket was irrigated with antibiotic irrigation. A St. Jude dual-chamber pacemaker, serial [id number] was connected to the atrial and ventricular pacing leads and placed back in the subcutaneous pocket. The pocket was irrigated with antibiotic  irrigation. The incision was closed with 2 layers of Vicryl suture. Benzoin and Steri-Strips for pain on the skin. A pressure dressing was applied. The patient was returned to his room in satisfactory condition.  Complications: There were no immediate procedure complications  Results: Successful implantation of a St. Jude dual-chamber pacemaker in a patient with symptomatic high-grade A-V block.  Lewayne BuntingGregg Asir Bingley, M.D.

## 2014-01-26 NOTE — Progress Notes (Signed)
   ELECTROPHYSIOLOGY ROUNDING NOTE    Patient Name: Matthew BeardsWilliam F Ridener Date of Encounter: 01/26/2014    SUBJECTIVE:Patient feels well.  No chest pain or shortness of breath. S/p PPM implant 01-25-14  TELEMETRY: Reviewed telemetry pt in sinus tach with ventricular pacing  Filed Vitals:   01/25/14 1900 01/25/14 2000 01/26/14 0000 01/26/14 0400  BP: 160/70 137/57 131/71 139/69  Pulse: 93 66 64 94  Temp:  97.7 F (36.5 C) 98.4 F (36.9 C) 97.7 F (36.5 C)  TempSrc:  Oral Oral Oral  Resp:  20 20 20   Height:      Weight:   190 lb 2.1 oz (86.244 kg)   SpO2:  97% 96% 95%    Intake/Output Summary (Last 24 hours) at 01/26/14 0625 Last data filed at 01/26/14 0534  Gross per 24 hour  Intake    220 ml  Output    625 ml  Net   -405 ml    CURRENT MEDICATIONS: . aspirin EC  81 mg Oral Daily  . finasteride  5 mg Oral Daily  . tamsulosin  0.4 mg Oral Daily     Radiology/Studies:  Final result pending, leads in stable position.  PHYSICAL EXAM No hematoma. RRR with no rub. Clear lungs.  DEVICE INTERROGATION: Device interrogation - normal device function  Active Problems:   Mobitz (type) II atrioventricular block   A/P 1. S/p PPM for AV block as above. Ok for discharge. Usual followup.  Leonia ReevesGregg Taylor,M.D

## 2014-02-02 DIAGNOSIS — R310 Gross hematuria: Secondary | ICD-10-CM | POA: Diagnosis not present

## 2014-02-04 ENCOUNTER — Ambulatory Visit (INDEPENDENT_AMBULATORY_CARE_PROVIDER_SITE_OTHER): Payer: Medicare Other | Admitting: *Deleted

## 2014-02-04 DIAGNOSIS — I4892 Unspecified atrial flutter: Secondary | ICD-10-CM

## 2014-02-04 DIAGNOSIS — I441 Atrioventricular block, second degree: Secondary | ICD-10-CM | POA: Diagnosis not present

## 2014-02-04 DIAGNOSIS — I4891 Unspecified atrial fibrillation: Secondary | ICD-10-CM | POA: Diagnosis not present

## 2014-02-04 LAB — MDC_IDC_ENUM_SESS_TYPE_INCLINIC
Battery Remaining Longevity: 115.2 mo
Brady Statistic RA Percent Paced: 14 %
Brady Statistic RV Percent Paced: 92 %
Date Time Interrogation Session: 20150430134944
Implantable Pulse Generator Serial Number: 3013717
Lead Channel Impedance Value: 475 Ohm
Lead Channel Impedance Value: 487.5 Ohm
Lead Channel Pacing Threshold Amplitude: 0.75 V
Lead Channel Pacing Threshold Amplitude: 0.75 V
Lead Channel Pacing Threshold Amplitude: 0.75 V
Lead Channel Pacing Threshold Pulse Width: 0.4 ms
Lead Channel Pacing Threshold Pulse Width: 0.4 ms
Lead Channel Pacing Threshold Pulse Width: 0.4 ms
Lead Channel Sensing Intrinsic Amplitude: 12 mV
Lead Channel Setting Pacing Amplitude: 1 V
Lead Channel Setting Pacing Amplitude: 3.5 V
Lead Channel Setting Sensing Sensitivity: 2 mV
MDC IDC MSMT BATTERY VOLTAGE: 3.07 V
MDC IDC MSMT LEADCHNL RA SENSING INTR AMPL: 5 mV
MDC IDC SET LEADCHNL RV PACING PULSEWIDTH: 0.4 ms

## 2014-02-08 NOTE — Progress Notes (Signed)
Wound check appointment. Steri-strips removed. Wound without redness or edema. Incision edges approximated, wound well healed. Normal device function. Thresholds, sensing, and impedances consistent with implant measurements. Device programmed at 3.5V and RV auto capture programmed on for extra safety margin until 3 month visit. Histogram distribution appropriate for patient and level of activity. 16 mode switches (<1%)---max dur. 12 sec, Max A 185, Max V 79---AT/AFL. No high ventricular rates noted. 79 "PMT" episodes---AT per EGMs. Patient educated about wound care, arm mobility, lifting restrictions. ROV in 3 months with GT.

## 2014-02-22 ENCOUNTER — Encounter: Payer: Self-pay | Admitting: Internal Medicine

## 2014-04-27 ENCOUNTER — Ambulatory Visit (INDEPENDENT_AMBULATORY_CARE_PROVIDER_SITE_OTHER): Payer: Medicare Other | Admitting: Cardiology

## 2014-04-27 ENCOUNTER — Encounter: Payer: Self-pay | Admitting: Cardiology

## 2014-04-27 VITALS — BP 150/60 | HR 64 | Ht 71.0 in | Wt 188.0 lb

## 2014-04-27 DIAGNOSIS — R55 Syncope and collapse: Secondary | ICD-10-CM | POA: Diagnosis not present

## 2014-04-27 DIAGNOSIS — Z950 Presence of cardiac pacemaker: Secondary | ICD-10-CM

## 2014-04-27 DIAGNOSIS — I4430 Unspecified atrioventricular block: Secondary | ICD-10-CM | POA: Diagnosis not present

## 2014-04-27 HISTORY — DX: Presence of cardiac pacemaker: Z95.0

## 2014-04-27 NOTE — Progress Notes (Signed)
[address] [place], [place]  [address] Phone: [phone] Fax:  [phone]  Date:  04/27/2014   ID:  Matthew Howard, DOB [birth year], MRN [id number]  PCP:  Tillman Abideichard Letvak, MD   History of Present Illness: Matthew Howard is a 80 y.o. male here for the evaluation of followup of syncope resulting in pacemaker implantation on 01/26/14 by Dr. Lewayne BuntingGregg Taylor.  Previously his syncopal episodes are as follows: First one in chuch happened when just stood up to sing. 2nd time at home in kitchen standing. Suddenly felt like needed to sit down. Fell on floor in front of wife. Syncope. Brief. 3rd time seeing wife in hospital. Standing, no chair, felt like he was going out. Few seconds. After episodes water seems to help. Had terminated prior episodes if he can sit quickly. Recently increased frequency of urination. He reports these episodes happened 3 times over the last few months. He has a sudden onset sensation of the need to sit down, he then blacks out and wakes up a few seconds later. Denies any chest pain, palpitations or shortness of breath. History of atrial fibrillation on records but now in sinus, rate controlled, no beta blocker. He is not currently on anticoagulation. EKG personally reviewed from 01/22/14 demonstrates what appears to be 2-1 AV nodal conduction bradycardia rate 49.  Orthostatics done in clinic demonstrated a 131/60 when laying, heart rate 49 beats per minute, 135/48 when sitting, heart rate 43 beats per minute, 129/62 when standing heart rate 75 beats per minute, after 3 minutes 122/56-61.  He has been doing well since pacemaker implantation, no further syncope. He sees Dr. Annabell HowellsWrenn for urology.    Wt Readings from Last 3 Encounters:  04/27/14 188 lb (85.276 kg)  01/26/14 190 lb 2.1 oz (86.244 kg)  01/26/14 190 lb 2.1 oz (86.244 kg)     Past Medical History  Diagnosis Date  . Gout   . Hyperlipidemia   . Atrial flutter     not well documented  .  Benign prostatic hypertrophy   . Symptomatic bradycardia     s/p dual chamber PPM implantation April 2015 [facility]([employer])    Past Surgical History  Procedure Laterality Date  . Cataract extraction    . Tonsillectomy    . Pacemaker insertion  01-25-2014    STJ Assurity dual chamber pacemaker implanted by Dr Ladona Ridgelaylor for symptomatic bradycardia    Current Outpatient Prescriptions  Medication Sig Dispense Refill  . aspirin 81 MG EC tablet Take 81 mg by mouth daily.       . finasteride (PROSCAR) 5 MG tablet Take 5 mg by mouth daily.      . Tamsulosin HCl (FLOMAX) 0.4 MG CAPS Take 1 capsule (0.4 mg total) by mouth daily.  90 capsule  1   No current facility-administered medications for this visit.    Allergies:   No Known Allergies  Social History:  The patient  reports that he has never smoked. He has never used smokeless tobacco. He reports that he drinks alcohol. He reports that he does not use illicit drugs.   Family History  Problem Relation Age of Onset  . Cancer Mother   . Throat cancer Father     ROS:  Please see the history of present illness.   No chest pain, no strokelike symptoms, no paresthesias, no joint pain.   All other systems reviewed and negative.   PHYSICAL EXAM: VS:  BP 150/60  Pulse 64  Ht 5\' 11"  (1.803 m)  Wt 188 lb (85.276 kg)  BMI 26.23 kg/m2 Well nourished, well developed, in no acute distressElderly HEENT: normal, [place]/AT, EOMI Neck: no JVD, normal carotid upstroke, no bruit Cardiac:  normal S1, S2; with ectopy; no murmur Lungs:  clear to auscultation bilaterally, no wheezing, rhonchi or rales Abd: soft, nontender, no hepatomegaly, no bruits Ext: trace ankle edema, 2+ distal pulses Skin: warm and dry GU: deferred Neuro: no focal abnormalities noted, AAO x 3  EKG: As above   Labs: TSH normal 9/14.  ASSESSMENT AND PLAN:  1. Syncope-Pacemaker has been implanted by Dr. [name]. Doing well. No further syncopal episodes. High degree AV  block. 2. Permanent pacemaker-he will have an appointment soon with pacemaker team. 3. We will see him back in one year.  Signed, [name], MD [facility]  04/27/2014 3:56 PM

## 2014-04-27 NOTE — Patient Instructions (Signed)
The current medical regimen is effective;  continue present plan and medications.  Follow up in 1 year with Dr Skains.  You will receive a letter in the mail 2 months before you are due.  Please call us when you receive this letter to schedule your follow up appointment.  

## 2014-04-29 ENCOUNTER — Encounter: Payer: Self-pay | Admitting: Internal Medicine

## 2014-04-29 ENCOUNTER — Ambulatory Visit (INDEPENDENT_AMBULATORY_CARE_PROVIDER_SITE_OTHER): Payer: Medicare Other | Admitting: Internal Medicine

## 2014-04-29 VITALS — BP 146/60 | HR 62 | Ht 71.0 in | Wt 188.2 lb

## 2014-04-29 DIAGNOSIS — I441 Atrioventricular block, second degree: Secondary | ICD-10-CM

## 2014-04-29 DIAGNOSIS — I480 Paroxysmal atrial fibrillation: Secondary | ICD-10-CM

## 2014-04-29 DIAGNOSIS — I4891 Unspecified atrial fibrillation: Secondary | ICD-10-CM

## 2014-04-29 DIAGNOSIS — Z950 Presence of cardiac pacemaker: Secondary | ICD-10-CM | POA: Diagnosis not present

## 2014-04-29 LAB — MDC_IDC_ENUM_SESS_TYPE_INCLINIC
Battery Remaining Longevity: 118.8 mo
Battery Voltage: 3.01 V
Implantable Pulse Generator Model: 2240
Lead Channel Impedance Value: 525 Ohm
Lead Channel Pacing Threshold Amplitude: 0.875 V
Lead Channel Pacing Threshold Pulse Width: 0.4 ms
Lead Channel Pacing Threshold Pulse Width: 0.4 ms
Lead Channel Sensing Intrinsic Amplitude: 10.8 mV
Lead Channel Sensing Intrinsic Amplitude: 5 mV
Lead Channel Setting Pacing Amplitude: 1.125
Lead Channel Setting Pacing Amplitude: 2 V
Lead Channel Setting Sensing Sensitivity: 2 mV
MDC IDC MSMT LEADCHNL RA PACING THRESHOLD AMPLITUDE: 0.75 V
MDC IDC MSMT LEADCHNL RA PACING THRESHOLD AMPLITUDE: 0.75 V
MDC IDC MSMT LEADCHNL RA PACING THRESHOLD PULSEWIDTH: 0.4 ms
MDC IDC MSMT LEADCHNL RV IMPEDANCE VALUE: 475 Ohm
MDC IDC PG SERIAL: 3013717
MDC IDC SESS DTM: 20150723155351
MDC IDC SET LEADCHNL RV PACING PULSEWIDTH: 0.4 ms
MDC IDC STAT BRADY RA PERCENT PACED: 44 %
MDC IDC STAT BRADY RV PERCENT PACED: 94 %

## 2014-04-29 NOTE — Patient Instructions (Addendum)
Your physician wants you to follow-up in: 9 months with Dr Taylor You will receive a reminder letter in the mail two months in advance. If you don't receive a letter, please call our office to schedule the follow-up appointment.   Remote monitoring is used to monitor your Pacemaker or ICD from home. This monitoring reduces the number of office visits required to check your device to one time per year. It allows us to keep an eye on the functioning of your device to ensure it is working properly. You are scheduled for a device check from home on 08/02/14. You may send your transmission at any time that day. If you have a wireless device, the transmission will be sent automatically. After your physician reviews your transmission, you will receive a postcard with your next transmission date.   

## 2014-04-29 NOTE — Assessment & Plan Note (Signed)
His St. Jude DDD PM is working normally. Will recheck in several months. 

## 2014-04-29 NOTE — Assessment & Plan Note (Signed)
He is maintaining NSR over 99% of the time. No change in medical therapy.

## 2014-04-29 NOTE — Progress Notes (Signed)
      HPI [name] returns today for followup. He is a pleasant 80 yo man with symptomatic bradycardia, s/p PPM with HTN and atrial arrhythmias. In the interim, he has been stable. He denies chest pain, sob, or syncope. He has slowed down some since his device was last checked. No Known Allergies   Current Outpatient Prescriptions  Medication Sig Dispense Refill  . aspirin 81 MG EC tablet Take 81 mg by mouth daily.       . finasteride (PROSCAR) 5 MG tablet Take 5 mg by mouth daily.      . Tamsulosin HCl (FLOMAX) 0.4 MG CAPS Take 1 capsule (0.4 mg total) by mouth daily.  90 capsule  1   No current facility-administered medications for this visit.     Past Medical History  Diagnosis Date  . Gout   . Hyperlipidemia   . Atrial flutter     not well documented  . Benign prostatic hypertrophy   . Symptomatic bradycardia     s/p dual chamber PPM implantation April 2015 [facility]([employer])    ROS:   All systems reviewed and negative except as noted in the HPI.   Past Surgical History  Procedure Laterality Date  . Cataract extraction    . Tonsillectomy    . Pacemaker insertion  01-25-2014    STJ Assurity dual chamber pacemaker implanted by Dr Ladona Ridgelaylor for symptomatic bradycardia     Family History  Problem Relation Age of Onset  . Cancer Mother   . Throat cancer Father      History   Social History  . Marital Status: Widowed    Spouse Name: Philomena CourseLola Fergusson    Number of Children: 4  . Years of Education: N/A   Occupational History  . retired     [employer]   Social History Main Topics  . Smoking status: Never Smoker   . Smokeless tobacco: Never Used  . Alcohol Use: Yes     Comment: rare etoh use  . Drug Use: No  . Sexual Activity: Not on file   Other Topics Concern  . Not on file   Social History Narrative   Widowed 4/15   No living will   No health care power of attorney--would want wife though   Would accept resuscitation but wouldn't want prolonged  artificial ventilation   No tube feeds if cognitively unaware     BP 146/60  Pulse 62  Ht 5\' 11"  (1.803 m)  Wt 188 lb 3.2 oz (85.367 kg)  BMI 26.26 kg/m2  Physical Exam:  Well appearing 80 yo man, NAD HEENT: Unremarkable Neck:  No JVD, no thyromegally Back:  No CVA tenderness Lungs:  Clear with no wheezes HEART:  Regular rate rhythm, no murmurs, no rubs, no clicks Abd:  soft, positive bowel sounds, no organomegally, no rebound, no guarding Ext:  2 plus pulses, no edema, no cyanosis, no clubbing Skin:  No rashes no nodules Neuro:  CN II through XII intact, motor grossly intact  DEVICE  Normal device function.  See PaceArt for details.   Assess/Plan:

## 2014-06-11 ENCOUNTER — Telehealth: Payer: Self-pay | Admitting: *Deleted

## 2014-06-11 NOTE — Telephone Encounter (Signed)
Patient informed that he had an episode of AF and GT would like to discuss anticoagulants with him. Patient voiced understanding. Will defer scheduling to [facility].

## 2014-06-17 ENCOUNTER — Encounter: Payer: Self-pay | Admitting: Internal Medicine

## 2014-06-17 ENCOUNTER — Ambulatory Visit (INDEPENDENT_AMBULATORY_CARE_PROVIDER_SITE_OTHER): Payer: Medicare Other | Admitting: Internal Medicine

## 2014-06-17 VITALS — BP 128/48 | HR 73 | Ht 72.0 in | Wt 186.0 lb

## 2014-06-17 DIAGNOSIS — Z950 Presence of cardiac pacemaker: Secondary | ICD-10-CM

## 2014-06-17 DIAGNOSIS — I4891 Unspecified atrial fibrillation: Secondary | ICD-10-CM

## 2014-06-17 DIAGNOSIS — I4430 Unspecified atrioventricular block: Secondary | ICD-10-CM

## 2014-06-17 DIAGNOSIS — I480 Paroxysmal atrial fibrillation: Secondary | ICD-10-CM

## 2014-06-17 LAB — MDC_IDC_ENUM_SESS_TYPE_INCLINIC
Battery Voltage: 3.01 V
Date Time Interrogation Session: 20150910105152
Implantable Pulse Generator Model: 2240
Implantable Pulse Generator Serial Number: 3013717
Lead Channel Impedance Value: 462.5 Ohm
Lead Channel Pacing Threshold Amplitude: 0.75 V
Lead Channel Pacing Threshold Amplitude: 0.875 V
Lead Channel Pacing Threshold Pulse Width: 0.4 ms
Lead Channel Pacing Threshold Pulse Width: 0.4 ms
Lead Channel Sensing Intrinsic Amplitude: 12 mV
Lead Channel Sensing Intrinsic Amplitude: 3.5 mV
Lead Channel Setting Pacing Amplitude: 2 V
Lead Channel Setting Sensing Sensitivity: 2 mV
MDC IDC MSMT BATTERY REMAINING LONGEVITY: 116.4 mo
MDC IDC MSMT LEADCHNL RA IMPEDANCE VALUE: 550 Ohm
MDC IDC MSMT LEADCHNL RA PACING THRESHOLD AMPLITUDE: 0.75 V
MDC IDC MSMT LEADCHNL RV PACING THRESHOLD PULSEWIDTH: 0.4 ms
MDC IDC SET LEADCHNL RV PACING AMPLITUDE: 1.125
MDC IDC SET LEADCHNL RV PACING PULSEWIDTH: 0.4 ms
MDC IDC STAT BRADY RA PERCENT PACED: 54 %
MDC IDC STAT BRADY RV PERCENT PACED: 98 %

## 2014-06-17 NOTE — Patient Instructions (Addendum)
Remote monitoring is used to monitor your pacemaker from home. This monitoring reduces the number of office visits required to check your device to one time per year. It allows [place] to keep an eye on the functioning of your device to ensure it is working properly. You are scheduled for a device check from home on 09-20-2014. You may send your transmission at any time that day. If you have a wireless device, the transmission will be sent automatically. After your physician reviews your transmission, you will receive a postcard with your next transmission date.  Your physician recommends that you schedule a follow-up appointment in: April 2016 with Dr.Taylor

## 2014-06-18 ENCOUNTER — Encounter: Payer: Self-pay | Admitting: Internal Medicine

## 2014-06-18 NOTE — Assessment & Plan Note (Signed)
His St. Jude PPM is working normally. Will recheck in several months.  

## 2014-06-18 NOTE — Progress Notes (Signed)
      HPI [name] returns today for followup. He is a pleasant 80 yo man with symptomatic bradycardia, s/p PPM with HTN and atrial arrhythmias. In the interim, he has been stable. He denies chest pain, sob, or syncope. When asked the patient to come in today before his regularly scheduled appointment because he was noted to have PAF on remote monitoring. He denies palpitations. He has not had a stroke. No Known Allergies   Current Outpatient Prescriptions  Medication Sig Dispense Refill  . aspirin 81 MG EC tablet Take 81 mg by mouth daily.       . finasteride (PROSCAR) 5 MG tablet Take 5 mg by mouth daily.      . Tamsulosin HCl (FLOMAX) 0.4 MG CAPS Take 1 capsule (0.4 mg total) by mouth daily.  90 capsule  1   No current facility-administered medications for this visit.     Past Medical History  Diagnosis Date  . Gout   . Hyperlipidemia   . Atrial flutter     not well documented  . Benign prostatic hypertrophy   . Symptomatic bradycardia     s/p dual chamber PPM implantation April 2015 [facility])    ROS:   All systems reviewed and negative except as noted in the HPI.   Past Surgical History  Procedure Laterality Date  . Cataract extraction    . Tonsillectomy    . Pacemaker insertion  01-25-2014    STJ Assurity dual chamber pacemaker implanted by Dr [name] for symptomatic bradycardia     Family History  Problem Relation Age of Onset  . Cancer Mother   . Throat cancer Father      History   Social History  . Marital Status: Widowed    Spouse Name: Matthew Howard    Number of Children: 4  . Years of Education: N/A   Occupational History  . retired     [employer]   Social History Main Topics  . Smoking status: Never Smoker   . Smokeless tobacco: Never Used  . Alcohol Use: Yes     Comment: rare etoh use  . Drug Use: No  . Sexual Activity: Not on file   Other Topics Concern  . Not on file   Social History Narrative   Widowed 4/15   No  living will   No health care power of attorney--would want wife though   Would accept resuscitation but wouldn't want prolonged artificial ventilation   No tube feeds if cognitively unaware     BP 128/48  Pulse 73  Ht 6' (1.829 m)  Wt 186 lb (84.369 kg)  BMI 25.22 kg/m2  Physical Exam:  Well appearing 80 yo man, NAD HEENT: Unremarkable Neck:  No JVD, no thyromegally Back:  No CVA tenderness Lungs:  Clear with no wheezes HEART:  Regular rate rhythm, no murmurs, no rubs, no clicks Abd:  soft, positive bowel sounds, no organomegally, no rebound, no guarding Ext:  2 plus pulses, no edema, no cyanosis, no clubbing Skin:  No rashes no nodules Neuro:  CN II through XII intact, motor grossly intact  DEVICE  Normal device function.  See PaceArt for details.   Assess/Plan:

## 2014-06-18 NOTE — Assessment & Plan Note (Signed)
The patient has evidence of PAF and is at risk for stroke. I discussed the importance of taking systemic anti-coagulation and recommended trying either coumadin or a NOAC. He understands that he is at increased risk of stroke and that blood thinners would reduce that risk. The patient refuses anti-coagulation.

## 2014-06-23 DIAGNOSIS — Z23 Encounter for immunization: Secondary | ICD-10-CM | POA: Diagnosis not present

## 2014-06-24 ENCOUNTER — Encounter: Payer: Self-pay | Admitting: Internal Medicine

## 2014-07-07 ENCOUNTER — Ambulatory Visit (INDEPENDENT_AMBULATORY_CARE_PROVIDER_SITE_OTHER): Payer: Medicare Other | Admitting: Internal Medicine

## 2014-07-07 ENCOUNTER — Encounter: Payer: Self-pay | Admitting: Internal Medicine

## 2014-07-07 VITALS — BP 130/66 | HR 61 | Temp 97.9°F | Resp 14 | Ht 72.0 in | Wt 187.2 lb

## 2014-07-07 DIAGNOSIS — I4891 Unspecified atrial fibrillation: Secondary | ICD-10-CM | POA: Diagnosis not present

## 2014-07-07 DIAGNOSIS — N400 Enlarged prostate without lower urinary tract symptoms: Secondary | ICD-10-CM | POA: Diagnosis not present

## 2014-07-07 DIAGNOSIS — Z0000 Encounter for general adult medical examination without abnormal findings: Secondary | ICD-10-CM

## 2014-07-07 DIAGNOSIS — M109 Gout, unspecified: Secondary | ICD-10-CM | POA: Diagnosis not present

## 2014-07-07 DIAGNOSIS — Z7189 Other specified counseling: Secondary | ICD-10-CM | POA: Diagnosis not present

## 2014-07-07 DIAGNOSIS — I480 Paroxysmal atrial fibrillation: Secondary | ICD-10-CM

## 2014-07-07 NOTE — Assessment & Plan Note (Signed)
I have personally reviewed the Medicare Annual Wellness questionnaire and have noted 1. The patient's medical and social history 2. Their use of alcohol, tobacco or illicit drugs 3. Their current medications and supplements 4. The patient's functional ability including ADL's, fall risks, home safety risks and hearing or visual             impairment. 5. Diet and physical activities 6. Evidence for depression or mood disorders  The patients weight, height, BMI and visual acuity have been recorded in the chart I have made referrals, counseling and provided education to the patient based review of the above and I have provided the pt with a written personalized care plan for preventive services.  I have provided you with a copy of your personalized plan for preventive services. Please take the time to review along with your updated medication list.  Doesn't want zostavax Had flu shot 2 weeks ago--will wait till next visit for the prevnar No cancer screening due to age

## 2014-07-07 NOTE — Patient Instructions (Signed)
I would recommend you take xarelto to reduce the risk of stroke. Please let me know if you are willing to take this.

## 2014-07-07 NOTE — Assessment & Plan Note (Signed)
See social history 

## 2014-07-07 NOTE — Assessment & Plan Note (Signed)
He voids fairly well on the dual therapy No signs of obstruction

## 2014-07-07 NOTE — Assessment & Plan Note (Signed)
Has been quiet Recent foot pain probably not gouty

## 2014-07-07 NOTE — Progress Notes (Signed)
Pre visit review using our clinic review tool, if applicable. No additional management support is needed unless otherwise documented below in the visit note. 

## 2014-07-07 NOTE — Assessment & Plan Note (Addendum)
Paroxysmal but asymptomatic He prefers no anticoagulation other than aspirin--discussed again I would recommend xarelto--he would consider

## 2014-07-07 NOTE — Progress Notes (Signed)
Subjective:    Patient ID: Matthew Howard, male    DOB: 04/15/1919, 80 y.o.   MRN: [id number]  HPI Here for Medicare wellness and follow up Reviewed form and advanced directives Reviewed his other physicians Wife died in April--he is managing okay Stays active No alcohol or tobacco Vision okay. Poor hearing No falls No depression or anhedonia. Still involved with church--children keep up with him Independent in instrumental ADLs No major cognitive issues---memory is not great though.  Reviewed his history of atrial fibrillation Had pacer in now and had a fib noted on remote monitoring Dr Ladona Ridgelaylor discussed anticoagulation No palpitations No chest pain Will have some sense of being "tired" but not really dyspneic Stays active keeping up with the yard, etc (riding mower, trims rose bushes, also does laundry)  Did have some recent "shots of pain" on foot at base of 1st or second left MTP Seemed to be "bumped up some" Doesn't think it was the gout Better after rubbing moisturizing cream No other joint problems and gout has been quiet  Voids okay Nocturia x 2-3--- stable Does gets some daytime urgency and some dribbling Occasionally gets sense of incomplete emptying--- will go back and then feels empty  Current Outpatient Prescriptions on File Prior to Visit  Medication Sig Dispense Refill  . aspirin 81 MG EC tablet Take 81 mg by mouth daily.       . finasteride (PROSCAR) 5 MG tablet Take 5 mg by mouth daily.      . Tamsulosin HCl (FLOMAX) 0.4 MG CAPS Take 1 capsule (0.4 mg total) by mouth daily.  90 capsule  1   No current facility-administered medications on file prior to visit.    No Known Allergies  Past Medical History  Diagnosis Date  . Gout   . Hyperlipidemia   . Atrial flutter     not well documented  . Benign prostatic hypertrophy   . Symptomatic bradycardia     s/p dual chamber PPM implantation April 2015 [facility])    Past Surgical History    Procedure Laterality Date  . Cataract extraction    . Tonsillectomy    . Pacemaker insertion  01-25-2014    STJ Assurity dual chamber pacemaker implanted by Dr Ladona Ridgelaylor for symptomatic bradycardia    Family History  Problem Relation Age of Onset  . Cancer Mother   . Throat cancer Father     History   Social History  . Marital Status: Widowed    Spouse Name: Philomena CourseLola Isenhower    Number of Children: 4  . Years of Education: N/A   Occupational History  . retired     [employer]   Social History Main Topics  . Smoking status: Never Smoker   . Smokeless tobacco: Never Used  . Alcohol Use: Yes     Comment: rare etoh use  . Drug Use: No  . Sexual Activity: Not on file   Other Topics Concern  . Not on file   Social History Narrative   Widowed 4/15   No living will   No health care power of attorney--would want daughter [name] though   Would accept resuscitation but wouldn't want prolonged artificial ventilation   No tube feeds if cognitively unaware   Review of Systems Sleeps fairly well--despite the nocturia Appetite is fine Weight is stable Bowels move okay-- has needed occasional laxative No skin problems--no rash or suspicious lesions (but has itchy spot on back)    Objective:  Physical Exam  Constitutional: He is oriented to person, place, and time. He appears well-developed and well-nourished. No distress.  HENT:  Mouth/Throat: Oropharynx is clear and moist. No oropharyngeal exudate.  Neck: Normal range of motion. Neck supple. No thyromegaly present.  Cardiovascular: Normal rate, regular rhythm and intact distal pulses.  Exam reveals no gallop and no friction rub.   Soft mitral murmur  Pulmonary/Chest: Effort normal and breath sounds normal. No respiratory distress. He has no wheezes. He has no rales.  Abdominal: Soft. He exhibits no distension. There is no tenderness. There is no rebound and no guarding.  Musculoskeletal: He exhibits no edema and no tenderness.   Prominence at proximal base of the left 1st metatarsal but no tenderness  Lymphadenopathy:    He has no cervical adenopathy.  Neurological: He is alert and oriented to person, place, and time.  President-- "Obama, ?" 100-93-84/5/6....... D-l-o-w Recall 1/3  Psychiatric: He has a normal mood and affect.          Assessment & Plan:

## 2014-09-16 ENCOUNTER — Encounter (HOSPITAL_COMMUNITY): Payer: Self-pay | Admitting: Internal Medicine

## 2014-09-20 ENCOUNTER — Ambulatory Visit (INDEPENDENT_AMBULATORY_CARE_PROVIDER_SITE_OTHER): Payer: Medicare Other | Admitting: *Deleted

## 2014-09-20 ENCOUNTER — Encounter: Payer: Self-pay | Admitting: Internal Medicine

## 2014-09-20 DIAGNOSIS — I4430 Unspecified atrioventricular block: Secondary | ICD-10-CM

## 2014-09-20 NOTE — Progress Notes (Signed)
Remote pacemaker transmission.   

## 2014-09-21 LAB — MDC_IDC_ENUM_SESS_TYPE_REMOTE
Battery Remaining Longevity: 121 mo
Battery Remaining Percentage: 95.5 %
Brady Statistic AS VS Percent: 2 %
Date Time Interrogation Session: 20151214130658
Implantable Pulse Generator Model: 2240
Implantable Pulse Generator Serial Number: 3013717
Lead Channel Impedance Value: 510 Ohm
Lead Channel Pacing Threshold Amplitude: 0.75 V
Lead Channel Pacing Threshold Pulse Width: 0.4 ms
Lead Channel Pacing Threshold Pulse Width: 0.4 ms
Lead Channel Sensing Intrinsic Amplitude: 10 mV
Lead Channel Setting Pacing Amplitude: 2 V
Lead Channel Setting Pacing Pulse Width: 0.4 ms
MDC IDC MSMT BATTERY VOLTAGE: 3.01 V
MDC IDC MSMT LEADCHNL RA PACING THRESHOLD AMPLITUDE: 0.75 V
MDC IDC MSMT LEADCHNL RA SENSING INTR AMPL: 5 mV
MDC IDC MSMT LEADCHNL RV IMPEDANCE VALUE: 480 Ohm
MDC IDC SET LEADCHNL RV PACING AMPLITUDE: 1 V
MDC IDC SET LEADCHNL RV SENSING SENSITIVITY: 2 mV
MDC IDC STAT BRADY AP VP PERCENT: 39 %
MDC IDC STAT BRADY AP VS PERCENT: 1 %
MDC IDC STAT BRADY AS VP PERCENT: 58 %
MDC IDC STAT BRADY RA PERCENT PACED: 39 %
MDC IDC STAT BRADY RV PERCENT PACED: 97 %

## 2014-09-24 ENCOUNTER — Encounter: Payer: Self-pay | Admitting: Cardiology

## 2014-10-01 ENCOUNTER — Other Ambulatory Visit: Payer: Self-pay | Admitting: Internal Medicine

## 2014-10-04 ENCOUNTER — Other Ambulatory Visit: Payer: Self-pay | Admitting: Internal Medicine

## 2014-12-06 DIAGNOSIS — N403 Nodular prostate with lower urinary tract symptoms: Secondary | ICD-10-CM | POA: Diagnosis not present

## 2014-12-06 DIAGNOSIS — R351 Nocturia: Secondary | ICD-10-CM | POA: Diagnosis not present

## 2014-12-06 DIAGNOSIS — R310 Gross hematuria: Secondary | ICD-10-CM | POA: Diagnosis not present

## 2015-01-03 ENCOUNTER — Other Ambulatory Visit: Payer: Self-pay | Admitting: Internal Medicine

## 2015-01-25 ENCOUNTER — Encounter: Payer: Self-pay | Admitting: Internal Medicine

## 2015-01-25 ENCOUNTER — Ambulatory Visit (INDEPENDENT_AMBULATORY_CARE_PROVIDER_SITE_OTHER): Payer: Medicare Other | Admitting: Internal Medicine

## 2015-01-25 VITALS — BP 150/58 | HR 61 | Ht 71.5 in | Wt 186.2 lb

## 2015-01-25 DIAGNOSIS — Z950 Presence of cardiac pacemaker: Secondary | ICD-10-CM

## 2015-01-25 DIAGNOSIS — I484 Atypical atrial flutter: Secondary | ICD-10-CM

## 2015-01-25 DIAGNOSIS — I4430 Unspecified atrioventricular block: Secondary | ICD-10-CM | POA: Diagnosis not present

## 2015-01-25 DIAGNOSIS — I480 Paroxysmal atrial fibrillation: Secondary | ICD-10-CM | POA: Diagnosis not present

## 2015-01-25 LAB — MDC_IDC_ENUM_SESS_TYPE_INCLINIC
Battery Remaining Longevity: 120 mo
Implantable Pulse Generator Model: 2240
Implantable Pulse Generator Serial Number: 3013717
Lead Channel Pacing Threshold Amplitude: 0.75 V
Lead Channel Pacing Threshold Amplitude: 0.875 V
Lead Channel Pacing Threshold Pulse Width: 0.4 ms
Lead Channel Sensing Intrinsic Amplitude: 11.1 mV
Lead Channel Sensing Intrinsic Amplitude: 5 mV
Lead Channel Setting Pacing Amplitude: 1.125
Lead Channel Setting Pacing Pulse Width: 0.4 ms
MDC IDC MSMT BATTERY VOLTAGE: 3.01 V
MDC IDC MSMT LEADCHNL RA IMPEDANCE VALUE: 450 Ohm
MDC IDC MSMT LEADCHNL RV IMPEDANCE VALUE: 475 Ohm
MDC IDC MSMT LEADCHNL RV PACING THRESHOLD PULSEWIDTH: 0.4 ms
MDC IDC SESS DTM: 20160419130301
MDC IDC SET LEADCHNL RA PACING AMPLITUDE: 2 V
MDC IDC SET LEADCHNL RV SENSING SENSITIVITY: 2 mV
MDC IDC STAT BRADY RA PERCENT PACED: 51 %
MDC IDC STAT BRADY RV PERCENT PACED: 97 %

## 2015-01-25 NOTE — Assessment & Plan Note (Addendum)
He has had no atrial fib. Continue current meds. He refused systemic anti-coagulation when I saw him last.

## 2015-01-25 NOTE — Assessment & Plan Note (Signed)
His St. Jude DDD PM is working normally. Will recheck in several months. 

## 2015-01-25 NOTE — Progress Notes (Signed)
      HPI [name] returns today for followup. He is a pleasant 81 yo man with symptomatic bradycardia, s/p PPM with HTN and atrial arrhythmias. In the interim, he has been stable. He denies chest pain, sob, or syncope. He denies palpitations. He has not had edema. No Known Allergies   Current Outpatient Prescriptions  Medication Sig Dispense Refill  . aspirin 81 MG EC tablet Take 81 mg by mouth daily.     . finasteride (PROSCAR) 5 MG tablet TAKE 1 TABLET BY MOUTH DAILY 90 tablet 0  . Tamsulosin HCl (FLOMAX) 0.4 MG CAPS Take 1 capsule (0.4 mg total) by mouth daily. 90 capsule 1   No current facility-administered medications for this visit.     Past Medical History  Diagnosis Date  . Gout   . Hyperlipidemia   . Atrial flutter     not well documented  . Benign prostatic hypertrophy   . Symptomatic bradycardia     s/p dual chamber PPM implantation April 2015 [facility] [employer])    ROS:   All systems reviewed and negative except as noted in the HPI.   Past Surgical History  Procedure Laterality Date  . Cataract extraction    . Tonsillectomy    . Pacemaker insertion  01-25-2014    STJ Assurity dual chamber pacemaker implanted by Dr Matthew Howard for symptomatic bradycardia  . Permanent pacemaker insertion N/A 01/25/2014    Procedure: PERMANENT PACEMAKER INSERTION;  Surgeon: Marinus MawGregg W Taylor, MD;  [facility];  Service: Cardiovascular;  Laterality: N/A;     Family History  Problem Relation Age of Onset  . Cancer Mother   . Throat cancer Father      History   Social History  . Marital Status: Widowed    Spouse Name: Matthew Howard  . Number of Children: 4  . Years of Education: N/A   Occupational History  . retired     [employer]   Social History Main Topics  . Smoking status: Never Smoker   . Smokeless tobacco: Never Used  . Alcohol Use: Yes     Comment: rare etoh use  . Drug Use: No  . Sexual Activity: Not on file   Other Topics Concern  . Not on  file   Social History Narrative   Widowed 4/15   No living will   No health care power of attorney--would want daughter Matthew Howard though   Would accept resuscitation but wouldn't want prolonged artificial ventilation   No tube feeds if cognitively unaware     BP 150/58 mmHg  Pulse 61  Ht 5' 11.5" (1.816 m)  Wt 186 lb 3.2 oz (84.46 kg)  BMI 25.61 kg/m2  Physical Exam:  Well appearing 81 yo man, NAD HEENT: Unremarkable Neck:  No JVD, no thyromegally Back:  No CVA tenderness Lungs:  Clear with no wheezes HEART:  Regular rate rhythm, no murmurs, no rubs, no clicks Abd:  soft, positive bowel sounds, no organomegally, no rebound, no guarding Ext:  2 plus pulses, no edema, no cyanosis, no clubbing Skin:  No rashes no nodules Neuro:  CN II through XII intact, motor grossly intact  DEVICE  Normal device function.  See PaceArt for details.   Assess/Plan:

## 2015-01-25 NOTE — Assessment & Plan Note (Signed)
His atrial rates vary from 160-240/min with a slow VR. He is asymptomatic. Will follow.

## 2015-01-25 NOTE — Patient Instructions (Signed)
Medication Instructions:  Your physician recommends that you continue on your current medications as directed. Please refer to the Current Medication list given to you today.   Labwork: NONE  Testing/Procedures: NONE  Follow-Up: Remote monitoring is used to monitor your Pacemaker or ICD from home. This monitoring reduces the number of office visits required to check your device to one time per year. It allows us to keep an eye on the functioning of your device to ensure it is working properly. You are scheduled for a device check from home on 04/26/2015. You may send your transmission at any time that day. If you have a wireless device, the transmission will be sent automatically. After your physician reviews your transmission, you will receive a postcard with your next transmission date.  Your physician wants you to follow-up in: 12 months with Dr. Ladona Ridgelaylor. You will receive a reminder letter in the mail two months in advance. If you don't receive a letter, please call our office to schedule the follow-up appointment.   Any Other Special Instructions Will Be Listed Below (If Applicable).

## 2015-04-04 ENCOUNTER — Encounter: Payer: Self-pay | Admitting: Cardiology

## 2015-04-26 ENCOUNTER — Encounter: Payer: Self-pay | Admitting: Internal Medicine

## 2015-04-26 ENCOUNTER — Ambulatory Visit (INDEPENDENT_AMBULATORY_CARE_PROVIDER_SITE_OTHER): Payer: Medicare Other | Admitting: *Deleted

## 2015-04-26 DIAGNOSIS — I4430 Unspecified atrioventricular block: Secondary | ICD-10-CM

## 2015-04-26 NOTE — Progress Notes (Signed)
Remote pacemaker transmission.   

## 2015-04-27 LAB — CUP PACEART REMOTE DEVICE CHECK
Battery Voltage: 3.01 V
Brady Statistic AP VP Percent: 66 %
Brady Statistic AS VP Percent: 31 %
Brady Statistic AS VS Percent: 1.5 %
Date Time Interrogation Session: 20160719104732
Lead Channel Impedance Value: 480 Ohm
Lead Channel Pacing Threshold Amplitude: 0.75 V
Lead Channel Pacing Threshold Pulse Width: 0.4 ms
Lead Channel Sensing Intrinsic Amplitude: 5 mV
Lead Channel Setting Pacing Pulse Width: 0.4 ms
Lead Channel Setting Sensing Sensitivity: 2 mV
MDC IDC MSMT BATTERY REMAINING LONGEVITY: 122 mo
MDC IDC MSMT BATTERY REMAINING PERCENTAGE: 95.5 %
MDC IDC MSMT LEADCHNL RA PACING THRESHOLD PULSEWIDTH: 0.4 ms
MDC IDC MSMT LEADCHNL RV IMPEDANCE VALUE: 490 Ohm
MDC IDC MSMT LEADCHNL RV PACING THRESHOLD AMPLITUDE: 0.875 V
MDC IDC MSMT LEADCHNL RV SENSING INTR AMPL: 12 mV
MDC IDC PG SERIAL: 3013717
MDC IDC SET LEADCHNL RA PACING AMPLITUDE: 2 V
MDC IDC SET LEADCHNL RV PACING AMPLITUDE: 1.125
MDC IDC STAT BRADY AP VS PERCENT: 1 %
MDC IDC STAT BRADY RA PERCENT PACED: 66 %
MDC IDC STAT BRADY RV PERCENT PACED: 97 %
Pulse Gen Model: 2240

## 2015-04-28 ENCOUNTER — Encounter: Payer: Self-pay | Admitting: Internal Medicine

## 2015-05-13 ENCOUNTER — Encounter: Payer: Self-pay | Admitting: Cardiology

## 2015-05-19 ENCOUNTER — Telehealth: Payer: Self-pay | Admitting: *Deleted

## 2015-05-19 NOTE — Telephone Encounter (Signed)
Alert received on [facility]. Pt's AT/AF burden now 19%. Pt asymptomatic. Pt continues to do yard work w/out issues. Pt takes ASA . Note on 01/25/15 w/ Dr. [name] states pt prefers to abstain from anti-coag. Pt reiterated today he prefers not to change his ASA to a blood thinner. Pt will discuss anti-coag w/ Dr. Anne Fu on 05/24/15. Pt aware appt at 8:15.

## 2015-05-24 ENCOUNTER — Encounter: Payer: Self-pay | Admitting: Cardiology

## 2015-05-24 ENCOUNTER — Ambulatory Visit (INDEPENDENT_AMBULATORY_CARE_PROVIDER_SITE_OTHER): Payer: Medicare Other | Admitting: Cardiology

## 2015-05-24 VITALS — BP 106/54 | HR 70 | Ht 71.5 in | Wt 186.0 lb

## 2015-05-24 DIAGNOSIS — I4892 Unspecified atrial flutter: Secondary | ICD-10-CM

## 2015-05-24 DIAGNOSIS — Z950 Presence of cardiac pacemaker: Secondary | ICD-10-CM

## 2015-05-24 DIAGNOSIS — I441 Atrioventricular block, second degree: Secondary | ICD-10-CM | POA: Diagnosis not present

## 2015-05-24 DIAGNOSIS — I4891 Unspecified atrial fibrillation: Secondary | ICD-10-CM

## 2015-05-24 NOTE — Patient Instructions (Signed)
Medication Instructions:  Your physician recommends that you continue on your current medications as directed. Please refer to the Current Medication list given to you today.  Follow-Up: Follow up in 1 year with Dr. Skains.  You will receive a letter in the mail 2 months before you are due.  Please call us when you receive this letter to schedule your follow up appointment.  Thank you for choosing [facility]!!       

## 2015-05-24 NOTE — Progress Notes (Signed)
[address] [place], [place]  [address] Phone: [phone] Fax:  [phone]  Date:  05/24/2015   ID:  Matthew Howard, DOB [birth year], MRN [id number]  PCP:  Tillman Abide, MD   History of Present Illness: Matthew Howard is a 81 y.o. male here for the evaluation of followup of syncope resulting in pacemaker implantation on 01/26/14 by Dr. Lewayne Bunting.  Previously his syncopal episodes are as follows: First one in chuch happened when just stood up to sing. 2nd time at home in kitchen standing. Suddenly felt like needed to sit down. Fell on floor in front of wife. Syncope. Brief. 3rd time seeing wife in hospital. Standing, no chair, felt like he was going out. Few seconds. After episodes water seems to help. Had terminated prior episodes if he can sit quickly. Recently increased frequency of urination. He reports these episodes happened 3 times over the last few months. He has a sudden onset sensation of the need to sit down, he then blacks out and wakes up a few seconds later. Denies any chest pain, palpitations or shortness of breath. History of atrial fibrillation on records but now in sinus, rate controlled, no beta blocker. He is not currently on anticoagulation. EKG personally reviewed from 01/22/14 demonstrates what appears to be 2-1 AV nodal conduction bradycardia rate 49.  Orthostatics done in clinic demonstrated a 131/60 when laying, heart rate 49 beats per minute, 135/48 when sitting, heart rate 43 beats per minute, 129/62 when standing heart rate 75 beats per minute, after 3 minutes 122/56-61.  He has been doing well since pacemaker implantation, no further syncope. He sees Dr. Annabell Howells for urology.  05/24/15-no chest pain, no syncope, no significant shortness of breath. Once again, he is noted to be in atrial flutter today. Pacemaker burden noted. He does not wish to take anticoagulation. Lengthy discussion. No prior strokelike symptoms.    Wt Readings from  Last 3 Encounters:  05/24/15 186 lb (84.369 kg)  01/25/15 186 lb 3.2 oz (84.46 kg)  07/07/14 187 lb 4 oz (84.936 kg)     Past Medical History  Diagnosis Date  . Gout   . Hyperlipidemia   . Atrial flutter     not well documented  . Benign prostatic hypertrophy   . Symptomatic bradycardia     s/p dual chamber PPM implantation April 2015 [facility])    Past Surgical History  Procedure Laterality Date  . Cataract extraction    . Tonsillectomy    . Pacemaker insertion  01-25-2014    STJ Assurity dual chamber pacemaker implanted by Dr [name] for symptomatic bradycardia  . Permanent pacemaker insertion N/A 01/25/2014    Procedure: PERMANENT PACEMAKER INSERTION;  Surgeon: Marinus Maw, MD;  Location: [facility] CATH LAB;  Service: Cardiovascular;  Laterality: N/A;    Current Outpatient Prescriptions  Medication Sig Dispense Refill  . aspirin 81 MG EC tablet Take 81 mg by mouth daily.     . finasteride (PROSCAR) 5 MG tablet TAKE 1 TABLET BY MOUTH DAILY 90 tablet 0  . Tamsulosin HCl (FLOMAX) 0.4 MG CAPS Take 1 capsule (0.4 mg total) by mouth daily. 90 capsule 1   No current facility-administered medications for this visit.    Allergies:   No Known Allergies  Social History:  The patient  reports that he has never smoked. He has never used smokeless tobacco. He reports that he drinks alcohol. He reports that he does not use  illicit drugs.   Family History  Problem Relation Age of Onset  . Cancer Mother   . Throat cancer Father     ROS:  Please see the history of present illness.   No chest pain, no strokelike symptoms, no paresthesias, no joint pain.   All other systems reviewed and negative.   PHYSICAL EXAM: VS:  BP 106/54 mmHg  Pulse 70  Ht 5' 11.5" (1.816 m)  Wt 186 lb (84.369 kg)  BMI 25.58 kg/m2 Well nourished, well developed, in no acute distressElderly HEENT: normal, [place]/AT, EOMI Neck: no JVD, normal carotid upstroke, no bruit Cardiac:  normal S1, S2; with ectopy; no  murmur Lungs:  clear to auscultation bilaterally, no wheezing, rhonchi or rales Abd: soft, nontender, no hepatomegaly, no bruits Ext: trace ankle edema, 2+ distal pulses Skin: warm and dry GU: deferred Neuro: no focal abnormalities noted, AAO x 3  EKG: EKG today 05/24/15-atrial flutter, ventricular paced, heart rate 70 Labs: TSH normal 9/14.  ASSESSMENT AND PLAN:  1. Syncope-Pacemaker has been implanted by Dr. [name]. Doing well. No further syncopal episodes. High degree AV block. 2. Permanent pacemaker-he will have an appointment soon with pacemaker team. 3. Atrial flutter - noted on EKG today. Noted on pacemaker checks as well. We discussed the implications of anticoagulation, risks versus benefits. He understands the risk of stroke. He still does not wish to be placed on anticoagulation. 4. We will see him back in one year.  Signed, [name], MD [facility]  05/24/2015 8:12 AM

## 2015-06-21 DIAGNOSIS — R312 Other microscopic hematuria: Secondary | ICD-10-CM | POA: Diagnosis not present

## 2015-06-21 DIAGNOSIS — R310 Gross hematuria: Secondary | ICD-10-CM | POA: Diagnosis not present

## 2015-06-22 ENCOUNTER — Encounter: Payer: Self-pay | Admitting: Internal Medicine

## 2015-06-30 ENCOUNTER — Other Ambulatory Visit: Payer: Self-pay | Admitting: Internal Medicine

## 2015-07-15 ENCOUNTER — Encounter: Payer: Self-pay | Admitting: Internal Medicine

## 2015-07-15 ENCOUNTER — Ambulatory Visit (INDEPENDENT_AMBULATORY_CARE_PROVIDER_SITE_OTHER): Payer: Medicare Other | Admitting: Internal Medicine

## 2015-07-15 VITALS — BP 120/60 | HR 69 | Temp 98.3°F | Ht 72.0 in | Wt 184.0 lb

## 2015-07-15 DIAGNOSIS — I480 Paroxysmal atrial fibrillation: Secondary | ICD-10-CM | POA: Diagnosis not present

## 2015-07-15 DIAGNOSIS — N400 Enlarged prostate without lower urinary tract symptoms: Secondary | ICD-10-CM | POA: Diagnosis not present

## 2015-07-15 DIAGNOSIS — Z0000 Encounter for general adult medical examination without abnormal findings: Secondary | ICD-10-CM | POA: Diagnosis not present

## 2015-07-15 DIAGNOSIS — Z23 Encounter for immunization: Secondary | ICD-10-CM | POA: Diagnosis not present

## 2015-07-15 DIAGNOSIS — Z7189 Other specified counseling: Secondary | ICD-10-CM

## 2015-07-15 DIAGNOSIS — M109 Gout, unspecified: Secondary | ICD-10-CM | POA: Diagnosis not present

## 2015-07-15 HISTORY — DX: Other specified counseling: Z71.89

## 2015-07-15 LAB — COMPREHENSIVE METABOLIC PANEL
ALBUMIN: 4 g/dL (ref 3.5–5.2)
ALT: 13 U/L (ref 0–53)
AST: 17 U/L (ref 0–37)
Alkaline Phosphatase: 51 U/L (ref 39–117)
BUN: 23 mg/dL (ref 6–23)
CALCIUM: 9.3 mg/dL (ref 8.4–10.5)
CHLORIDE: 104 meq/L (ref 96–112)
CO2: 30 mEq/L (ref 19–32)
Creatinine, Ser: 1.18 mg/dL (ref 0.40–1.50)
GFR: 61.1 mL/min (ref >60.00)
Glucose, Bld: 93 mg/dL (ref 70–99)
POTASSIUM: 4.1 meq/L (ref 3.5–5.1)
Sodium: 141 mEq/L (ref 135–145)
Total Bilirubin: 0.8 mg/dL (ref 0.2–1.2)
Total Protein: 6.9 g/dL (ref 6.0–8.3)

## 2015-07-15 LAB — CBC WITH DIFFERENTIAL/PLATELET
BASOS PCT: 0.3 % (ref 0.0–3.0)
Basophils Absolute: 0 10*3/uL (ref 0.0–0.1)
EOS PCT: 1.8 % (ref 0.0–5.0)
Eosinophils Absolute: 0.1 10*3/uL (ref 0.0–0.7)
HEMATOCRIT: 41.4 % (ref 39.0–52.0)
HEMOGLOBIN: 13.6 g/dL (ref 13.0–17.0)
LYMPHS PCT: 40.3 % (ref 12.0–46.0)
Lymphs Abs: 3 10*3/uL (ref 0.7–4.0)
MCHC: 32.8 g/dL (ref 30.0–36.0)
MCV: 95.3 fl (ref 78.0–100.0)
MONOS PCT: 7 % (ref 3.0–12.0)
Monocytes Absolute: 0.5 10*3/uL (ref 0.1–1.0)
NEUTROS ABS: 3.7 10*3/uL (ref 1.4–7.7)
Neutrophils Relative %: 50.6 % (ref 43.0–77.0)
PLATELETS: 194 10*3/uL (ref 150.0–400.0)
RBC: 4.34 Mil/uL (ref 4.22–5.81)
RDW: 15.8 % — AB (ref 11.5–15.5)
WBC: 7.3 10*3/uL (ref 4.0–10.5)

## 2015-07-15 LAB — T4, FREE: Free T4: 0.98 ng/dL (ref 0.60–1.60)

## 2015-07-15 NOTE — Progress Notes (Signed)
Subjective:    Patient ID: Matthew Howard, male    DOB: [birth year], 81 y.o.   MRN: [id number]  HPI Here for Medicare wellness visit and follow up of chronic medical conditions Reviewed form and advanced directives Reviewed other doctors No tobacco or alcohol Hearing aides work okay Vision is okay No regular exercise No falls No depression or anhedonia Remains independent with instrumental ADLs Memory is not as good--mostly for names. Nothing worrisome  Went to urologist recently Noted some blood spots on underwear Had to use a pad--but cleared on its own No infection by culture No problems since then (a month ago) No longer needs the pad Voiding okay Nocturia x 2-3 which is stable Some daytime urgency--also no change  Heart is fine No palpitations  No chest pain No SOB No dizziness or syncope Does some work around house or walking around yard--no change in exercise tolerance (though does have limited tolerance over the past 1-2 years)  No problems with the gout He keeps cherry juice and this helps if he starts with any symptoms  Current Outpatient Prescriptions on File Prior to Visit  Medication Sig Dispense Refill  . aspirin 81 MG EC tablet Take 81 mg by mouth daily.     . finasteride (PROSCAR) 5 MG tablet TAKE 1 TABLET BY MOUTH DAILY 90 tablet 0  . Tamsulosin HCl (FLOMAX) 0.4 MG CAPS Take 1 capsule (0.4 mg total) by mouth daily. 90 capsule 1   No current facility-administered medications on file prior to visit.    No Known Allergies  Past Medical History  Diagnosis Date  . Gout   . Hyperlipidemia   . Atrial flutter (HCC)     not well documented  . Benign prostatic hypertrophy   . Symptomatic bradycardia     s/p dual chamber PPM implantation April 2015 [facility])    Past Surgical History  Procedure Laterality Date  . Cataract extraction    . Tonsillectomy    . Pacemaker insertion  01-25-2014    STJ Assurity dual chamber pacemaker implanted  by Dr [name] for symptomatic bradycardia  . Permanent pacemaker insertion N/A 01/25/2014    Procedure: PERMANENT PACEMAKER INSERTION;  Surgeon: Marinus Maw, MD;  Location: [facility] CATH LAB;  Service: Cardiovascular;  Laterality: N/A;    Family History  Problem Relation Age of Onset  . Cancer Mother   . Throat cancer Father     Social History   Social History  . Marital Status: Widowed    Spouse Name: Matthew Howard  . Number of Children: 4  . Years of Education: N/A   Occupational History  . retired     [employer]   Social History Main Topics  . Smoking status: Never Smoker   . Smokeless tobacco: Never Used  . Alcohol Use: Yes     Comment: rare etoh use  . Drug Use: No  . Sexual Activity: Not on file   Other Topics Concern  . Not on file   Social History Narrative   Widowed 4/15   No living will   No health care power of attorney--would want daughter Matthew Howard though   Would accept resuscitation but wouldn't want prolonged artificial ventilation   No tube feeds if cognitively unaware   Review of Systems Sleeps well most of the time Has adjusted to living alone Wears seat belt Teeth are okay--keeps up with dentist No sig back or joint pain No skin problems or suspicious lesions Easy bruising and  bleeding Bowels are fine    Objective:   Physical Exam  Constitutional: He is oriented to person, place, and time. He appears well-developed and well-nourished. No distress.  HENT:  Mouth/Throat: Oropharynx is clear and moist. No oropharyngeal exudate.  Neck: Normal range of motion. Neck supple. No thyromegaly present.  Cardiovascular: Normal rate, regular rhythm and intact distal pulses.  Exam reveals no gallop.   Faint pedal pulses Soft systolic murmur at apex  Pulmonary/Chest: Effort normal and breath sounds normal. No respiratory distress. He has no wheezes. He has no rales.  Abdominal: Soft. There is no tenderness.  Musculoskeletal: He exhibits no tenderness.  Trace  ankle edema  Lymphadenopathy:    He has no cervical adenopathy.  Neurological: He is alert and oriented to person, place, and time.  President-- "Obama, ?" 100-93-80 something D-l-o-w Recall 2/3  Skin: No rash noted. No erythema.  Psychiatric: He has a normal mood and affect. His behavior is normal.          Assessment & Plan:

## 2015-07-15 NOTE — Addendum Note (Signed)
Addended by: Sueanne Margarita on: 07/15/2015 12:02 PM   Modules accepted: Orders

## 2015-07-15 NOTE — Assessment & Plan Note (Signed)
Rare exacerbations that cherry juice relieves

## 2015-07-15 NOTE — Progress Notes (Signed)
Pre visit review using our clinic review tool, if applicable. No additional management support is needed unless otherwise documented below in the visit note. 

## 2015-07-15 NOTE — Assessment & Plan Note (Signed)
Voids okay on dual therapy Had brief period of hematuria--but culture negative Was due to go back to urologist for follow up--he isn't sure about this (he probably needs cysto if recurrent hematuria)

## 2015-07-15 NOTE — Assessment & Plan Note (Signed)
Apparently has low burden and doesn't want anticoagulation No symptoms ASA only

## 2015-07-15 NOTE — Assessment & Plan Note (Signed)
See social history I strongly recommended he discuss his wishes with his 4 children Not willing to do formal POA

## 2015-07-15 NOTE — Assessment & Plan Note (Signed)
I have personally reviewed the Medicare Annual Wellness questionnaire and have noted 1. The patient's medical and social history 2. Their use of alcohol, tobacco or illicit drugs 3. Their current medications and supplements 4. The patient's functional ability including ADL's, fall risks, home safety risks and hearing or visual             impairment. 5. Diet and physical activities 6. Evidence for depression or mood disorders  The patients weight, height, BMI and visual acuity have been recorded in the chart I have made referrals, counseling and provided education to the patient based review of the above and I have provided the pt with a written personalized care plan for preventive services.  I have provided you with a copy of your personalized plan for preventive services. Please take the time to review along with your updated medication list.  Will give flu and prevnar No cancer screening due to age Discussed staying active

## 2015-07-18 ENCOUNTER — Encounter: Payer: Self-pay | Admitting: *Deleted

## 2015-07-26 ENCOUNTER — Encounter: Payer: Self-pay | Admitting: Cardiology

## 2015-07-26 ENCOUNTER — Ambulatory Visit (INDEPENDENT_AMBULATORY_CARE_PROVIDER_SITE_OTHER): Payer: Medicare Other | Admitting: *Deleted

## 2015-07-26 DIAGNOSIS — I441 Atrioventricular block, second degree: Secondary | ICD-10-CM | POA: Diagnosis not present

## 2015-07-26 LAB — CUP PACEART REMOTE DEVICE CHECK
Battery Remaining Longevity: 121 mo
Battery Remaining Percentage: 95.5 %
Brady Statistic AP VP Percent: 66 %
Brady Statistic AP VS Percent: 1 %
Brady Statistic AS VS Percent: 1.5 %
Brady Statistic RV Percent Paced: 98 %
Implantable Lead Implant Date: 20150420
Implantable Lead Location: 753859
Lead Channel Impedance Value: 480 Ohm
Lead Channel Pacing Threshold Amplitude: 0.875 V
Lead Channel Pacing Threshold Pulse Width: 0.4 ms
Lead Channel Setting Pacing Amplitude: 2 V
Lead Channel Setting Pacing Pulse Width: 0.4 ms
Lead Channel Setting Sensing Sensitivity: 2 mV
MDC IDC LEAD IMPLANT DT: 20150420
MDC IDC LEAD LOCATION: 753860
MDC IDC MSMT BATTERY VOLTAGE: 2.99 V
MDC IDC MSMT LEADCHNL RA IMPEDANCE VALUE: 440 Ohm
MDC IDC MSMT LEADCHNL RV SENSING INTR AMPL: 9.6 mV
MDC IDC SESS DTM: 20161018112508
MDC IDC SET LEADCHNL RV PACING AMPLITUDE: 1.125
MDC IDC STAT BRADY AS VP PERCENT: 31 %
MDC IDC STAT BRADY RA PERCENT PACED: 33 %
Pulse Gen Model: 2240
Pulse Gen Serial Number: 3013717

## 2015-07-26 NOTE — Progress Notes (Signed)
Remote pacemaker transmission.   

## 2015-09-18 ENCOUNTER — Other Ambulatory Visit: Payer: Self-pay | Admitting: Internal Medicine

## 2015-10-25 ENCOUNTER — Ambulatory Visit (INDEPENDENT_AMBULATORY_CARE_PROVIDER_SITE_OTHER): Payer: Self-pay | Admitting: *Deleted

## 2015-10-25 DIAGNOSIS — I441 Atrioventricular block, second degree: Secondary | ICD-10-CM

## 2015-10-25 NOTE — Progress Notes (Signed)
Remote pacemaker transmission.   

## 2015-11-01 LAB — CUP PACEART REMOTE DEVICE CHECK
Battery Remaining Longevity: 122 mo
Brady Statistic AP VS Percent: 1 %
Brady Statistic AS VP Percent: 31 %
Brady Statistic AS VS Percent: 1.5 %
Brady Statistic RA Percent Paced: 22 %
Date Time Interrogation Session: 20170117121045
Implantable Lead Implant Date: 20150420
Implantable Lead Location: 753859
Lead Channel Impedance Value: 490 Ohm
Lead Channel Pacing Threshold Pulse Width: 0.4 ms
Lead Channel Sensing Intrinsic Amplitude: 9.4 mV
Lead Channel Setting Pacing Amplitude: 1.125
Lead Channel Setting Pacing Amplitude: 2 V
MDC IDC LEAD IMPLANT DT: 20150420
MDC IDC LEAD LOCATION: 753860
MDC IDC MSMT BATTERY REMAINING PERCENTAGE: 95.5 %
MDC IDC MSMT BATTERY VOLTAGE: 2.99 V
MDC IDC MSMT LEADCHNL RA IMPEDANCE VALUE: 410 Ohm
MDC IDC MSMT LEADCHNL RV PACING THRESHOLD AMPLITUDE: 0.875 V
MDC IDC SET LEADCHNL RV PACING PULSEWIDTH: 0.4 ms
MDC IDC SET LEADCHNL RV SENSING SENSITIVITY: 2 mV
MDC IDC STAT BRADY AP VP PERCENT: 66 %
MDC IDC STAT BRADY RV PERCENT PACED: 98 %
Pulse Gen Serial Number: 3013717

## 2015-11-04 ENCOUNTER — Encounter: Payer: Self-pay | Admitting: Cardiology

## 2015-12-22 ENCOUNTER — Ambulatory Visit (INDEPENDENT_AMBULATORY_CARE_PROVIDER_SITE_OTHER): Payer: Medicare Other | Admitting: Internal Medicine

## 2015-12-22 ENCOUNTER — Encounter: Payer: Self-pay | Admitting: Internal Medicine

## 2015-12-22 VITALS — BP 144/64 | HR 70 | Temp 97.8°F | Wt 191.0 lb

## 2015-12-22 DIAGNOSIS — H6123 Impacted cerumen, bilateral: Secondary | ICD-10-CM

## 2015-12-22 NOTE — Patient Instructions (Signed)
Cerumen Impaction The structures of the external ear canal secrete a waxy substance known as cerumen. Excess cerumen can build up in the ear canal, causing a condition known as cerumen impaction. Cerumen impaction can cause ear pain and disrupt the function of the ear. The rate of cerumen production differs for each individual. In certain individuals, the configuration of the ear canal may decrease his or her ability to naturally remove cerumen. CAUSES Cerumen impaction is caused by excessive cerumen production or buildup. RISK FACTORS  Frequent use of swabs to clean ears.  Having narrow ear canals.  Having eczema.  Being dehydrated. SIGNS AND SYMPTOMS  Diminished hearing.  Ear drainage.  Ear pain.  Ear itch. TREATMENT Treatment may involve:  Over-the-counter or prescription ear drops to soften the cerumen.  Removal of cerumen by a health care provider. This may be done with:  Irrigation with warm water. This is the most common method of removal.  Ear curettes and other instruments.  Surgery. This may be done in severe cases. HOME CARE INSTRUCTIONS  Take medicines only as directed by your health care provider.  Do not insert objects into the ear with the intent of cleaning the ear. PREVENTION  Do not insert objects into the ear, even with the intent of cleaning the ear. Removing cerumen as a part of normal hygiene is not necessary, and the use of swabs in the ear canal is not recommended.  Drink enough water to keep your urine clear or pale yellow.  Control your eczema if you have it. SEEK MEDICAL CARE IF:  You develop ear pain.  You develop bleeding from the ear.  The cerumen does not clear after you use ear drops as directed.   This information is not intended to replace advice given to you by your health care provider. Make sure you discuss any questions you have with your health care provider.   Document Released: 11/01/2004 Document Revised: 10/15/2014  Document Reviewed: 05/11/2015 Elsevier Interactive Patient Education 2016 [employer]  

## 2015-12-22 NOTE — Progress Notes (Signed)
Subjective:    Patient ID: Matthew Howard, male    DOB: 1918-10-14, 82 y.o.   MRN: [id number]  HPI  Pt presents to the clinic today to have his ears cleaned out. He reports he has had an issue with wax buildup in the past. He denies ear pain or decreased hearing. He has not tried anything OTC.  Review of Systems      Past Medical History  Diagnosis Date  . Gout   . Hyperlipidemia   . Atrial flutter (HCC)     not well documented  . Benign prostatic hypertrophy   . Symptomatic bradycardia     s/p dual chamber PPM implantation April 2015 [facility])    Current Outpatient Prescriptions  Medication Sig Dispense Refill  . aspirin 81 MG EC tablet Take 81 mg by mouth daily.     . finasteride (PROSCAR) 5 MG tablet TAKE 1 TABLET BY MOUTH DAILY 90 tablet 3  . Tamsulosin HCl (FLOMAX) 0.4 MG CAPS Take 1 capsule (0.4 mg total) by mouth daily. 90 capsule 1   No current facility-administered medications for this visit.    No Known Allergies  Family History  Problem Relation Age of Onset  . Cancer Mother   . Throat cancer Father     Social History   Social History  . Marital Status: Widowed    Spouse Name: Nicki Gracy  . Number of Children: 4  . Years of Education: N/A   Occupational History  . retired     [employer]   Social History Main Topics  . Smoking status: Never Smoker   . Smokeless tobacco: Never Used  . Alcohol Use: Yes     Comment: rare etoh use  . Drug Use: No  . Sexual Activity: Not on file   Other Topics Concern  . Not on file   Social History Narrative   Widowed 4/15   No living will   No health care power of attorney--would want daughter [name] though   Would accept resuscitation but wouldn't want prolonged artificial ventilation   No tube feeds if cognitively unaware     Constitutional: Denies fever, malaise, fatigue, headache or abrupt weight changes.  HEENT: Pt reports wax buildup. Denies eye pain, eye redness, ear pain, ringing  in the ears, runny nose, nasal congestion, bloody nose, or sore throat. Respiratory: Denies difficulty breathing, shortness of breath, cough or sputum production.   Cardiovascular: Denies chest pain, chest tightness, palpitations or swelling in the hands or feet.   No other specific complaints in a complete review of systems (except as listed in HPI above).  Objective:   Physical Exam   BP 144/64 mmHg  Pulse 70  Temp(Src) 97.8 F (36.6 C) (Oral)  Wt 191 lb (86.637 kg)  SpO2 97% Wt Readings from Last 3 Encounters:  12/22/15 191 lb (86.637 kg)  07/15/15 184 lb (83.462 kg)  05/24/15 186 lb (84.369 kg)    General: Appears his stated age,  in NAD. HEENT: Head: normal shape and size; Ears: Bilateral cerumen impaction. Neurological: Alert and oriented.   BMET    Component Value Date/Time   NA 141 07/15/2015 1124   K 4.1 07/15/2015 1124   CL 104 07/15/2015 1124   CO2 30 07/15/2015 1124   GLUCOSE 93 07/15/2015 1124   BUN 23 07/15/2015 1124   CREATININE 1.18 07/15/2015 1124   CALCIUM 9.3 07/15/2015 1124   GFRNONAA 74.87 01/06/2010 0000   GFRAA 81 11/01/2008 1534  Lipid Panel     Component Value Date/Time   CHOL 141 11/01/2008 1534   TRIG 104 11/01/2008 1534   HDL 43.4 11/01/2008 1534   CHOLHDL 3.2 CALC 11/01/2008 1534   VLDL 21 11/01/2008 1534   LDLCALC 77 11/01/2008 1534    CBC    Component Value Date/Time   WBC 7.3 07/15/2015 1124   RBC 4.34 07/15/2015 1124   HGB 13.6 07/15/2015 1124   HCT 41.4 07/15/2015 1124   PLT 194.0 07/15/2015 1124   MCV 95.3 07/15/2015 1124   MCHC 32.8 07/15/2015 1124   RDW 15.8* 07/15/2015 1124   LYMPHSABS 3.0 07/15/2015 1124   MONOABS 0.5 07/15/2015 1124   EOSABS 0.1 07/15/2015 1124   BASOSABS 0.0 07/15/2015 1124    Hgb A1C No results found for: HGBA1C      Assessment & Plan:   Bilateral Cerumen Impaction:  Manual lavage by CMA Advised him to try Debrox solution 2 x week to prevent wax buildup  RTC as needed or if  symptoms persist or worsen

## 2015-12-22 NOTE — Progress Notes (Signed)
Pre visit review using our clinic review tool, if applicable. No additional management support is needed unless otherwise documented below in the visit note. 

## 2016-01-27 ENCOUNTER — Encounter: Payer: Self-pay | Admitting: Internal Medicine

## 2016-01-27 ENCOUNTER — Ambulatory Visit (INDEPENDENT_AMBULATORY_CARE_PROVIDER_SITE_OTHER): Payer: Medicare Other | Admitting: Internal Medicine

## 2016-01-27 VITALS — BP 156/58 | HR 72 | Ht 71.0 in | Wt 186.6 lb

## 2016-01-27 DIAGNOSIS — I481 Persistent atrial fibrillation: Secondary | ICD-10-CM

## 2016-01-27 DIAGNOSIS — I4819 Other persistent atrial fibrillation: Secondary | ICD-10-CM

## 2016-01-27 LAB — CUP PACEART INCLINIC DEVICE CHECK
Battery Voltage: 2.99 V
Brady Statistic RA Percent Paced: 22 %
Brady Statistic RV Percent Paced: 98 %
Date Time Interrogation Session: 20170421133903
Implantable Lead Location: 753860
Lead Channel Impedance Value: 487.5 Ohm
Lead Channel Pacing Threshold Amplitude: 1 V
Lead Channel Pacing Threshold Pulse Width: 0.4 ms
Lead Channel Sensing Intrinsic Amplitude: 9.3 mV
Lead Channel Setting Pacing Amplitude: 2 V
Lead Channel Setting Pacing Pulse Width: 0.4 ms
Lead Channel Setting Sensing Sensitivity: 2 mV
MDC IDC LEAD IMPLANT DT: 20150420
MDC IDC LEAD IMPLANT DT: 20150420
MDC IDC LEAD LOCATION: 753859
MDC IDC MSMT BATTERY REMAINING LONGEVITY: 111.6
MDC IDC MSMT LEADCHNL RA IMPEDANCE VALUE: 450 Ohm
MDC IDC MSMT LEADCHNL RA PACING THRESHOLD AMPLITUDE: 0.5 V
MDC IDC MSMT LEADCHNL RA PACING THRESHOLD AMPLITUDE: 0.5 V
MDC IDC MSMT LEADCHNL RA PACING THRESHOLD PULSEWIDTH: 0.4 ms
MDC IDC MSMT LEADCHNL RA SENSING INTR AMPL: 5 mV
MDC IDC MSMT LEADCHNL RV PACING THRESHOLD AMPLITUDE: 1 V
MDC IDC MSMT LEADCHNL RV PACING THRESHOLD PULSEWIDTH: 0.4 ms
MDC IDC MSMT LEADCHNL RV PACING THRESHOLD PULSEWIDTH: 0.4 ms
MDC IDC SET LEADCHNL RV PACING AMPLITUDE: 2.5 V
Pulse Gen Model: 2240
Pulse Gen Serial Number: 3013717

## 2016-01-27 NOTE — Patient Instructions (Signed)
Medication Instructions:  Your physician recommends that you continue on your current medications as directed. Please refer to the Current Medication list given to you today.   Labwork: None ordered   Testing/Procedures: None ordered   Follow-Up: Your physician wants you to follow-up in: 12 months with Dr Taylor You will receive a reminder letter in the mail two months in advance. If you don't receive a letter, please call our office to schedule the follow-up appointment.  Remote monitoring is used to monitor your Pacemaker  from home. This monitoring reduces the number of office visits required to check your device to one time per year. It allows us to keep an eye on the functioning of your device to ensure it is working properly. You are scheduled for a device check from home on 04/30/16. You may send your transmission at any time that day. If you have a wireless device, the transmission will be sent automatically. After your physician reviews your transmission, you will receive a postcard with your next transmission date.     Any Other Special Instructions Will Be Listed Below (If Applicable).     If you need a refill on your cardiac medications before your next appointment, please call your pharmacy.   

## 2016-01-27 NOTE — Progress Notes (Signed)
HPI [name] returns today for followup. He is a pleasant 83 yo man with symptomatic bradycardia, s/p PPM with HTN and atrial arrhythmias including atrial fib. In the interim, he has been stable. He denies chest pain, sob, or syncope. He denies palpitations. He has not had edema. He did not feel his atrial fib. No Known Allergies   Current Outpatient Prescriptions  Medication Sig Dispense Refill  . aspirin 81 MG EC tablet Take 81 mg by mouth daily.     . finasteride (PROSCAR) 5 MG tablet TAKE 1 TABLET BY MOUTH DAILY 90 tablet 3  . Tamsulosin HCl (FLOMAX) 0.4 MG CAPS Take 1 capsule (0.4 mg total) by mouth daily. 90 capsule 1   No current facility-administered medications for this visit.     Past Medical History  Diagnosis Date  . Gout   . Hyperlipidemia   . Atrial flutter (HCC)     not well documented  . Benign prostatic hypertrophy   . Symptomatic bradycardia     s/p dual chamber PPM implantation April 2015 [facility])    ROS:   All systems reviewed and negative except as noted in the HPI.   Past Surgical History  Procedure Laterality Date  . Cataract extraction    . Tonsillectomy    . Pacemaker insertion  01-25-2014    STJ Assurity dual chamber pacemaker implanted by Dr Matthew Howard for symptomatic bradycardia  . Permanent pacemaker insertion N/A 01/25/2014    Procedure: PERMANENT PACEMAKER INSERTION;  Surgeon: Matthew Maw, MD;  Location: [facility] CATH LAB;  Service: Cardiovascular;  Laterality: N/A;     Family History  Problem Relation Age of Onset  . Cancer Mother   . Throat cancer Father      Social History   Social History  . Marital Status: Widowed    Spouse Name: Matthew Howard  . Number of Children: 4  . Years of Education: N/A   Occupational History  . retired     [employer]   Social History Main Topics  . Smoking status: Never Smoker   . Smokeless tobacco: Never Used  . Alcohol Use: Yes     Comment: rare etoh use  . Drug Use: No  .  Sexual Activity: Not on file   Other Topics Concern  . Not on file   Social History Narrative   Widowed 4/15   No living will   No health care power of attorney--would want daughter Matthew Howard though   Would accept resuscitation but wouldn't want prolonged artificial ventilation   No tube feeds if cognitively unaware     BP 156/58 mmHg  Pulse 72  Ht  (1.803 m)  Wt 186 lb 9.6 oz (84.641 kg)  BMI 26.04 kg/m2  Physical Exam:  Well appearing 82 yo man, NAD HEENT: Unremarkable Neck:  No JVD, no thyromegally Back:  No CVA tenderness Lungs:  Clear with no wheezes HEART:  Regular rate rhythm, no murmurs, no rubs, no clicks Abd:  soft, positive bowel sounds, no organomegally, no rebound, no guarding Ext:  2 plus pulses, no edema, no cyanosis, no clubbing Skin:  No rashes no nodules Neuro:  CN II through XII intact, motor grossly intact  DEVICE  Normal device function.  See PaceArt for details.   ECG - nsr with ventricular pacing  Assess/Plan: 1.PAF - he is currently back in NSR. I have recommended he start systemic anti-coagulation but he refuses. He understands that he could have a  stroke. 2. PM - his [employer] device is working normally. Will recheck in several months. 3. HTN - he has elevated systolic pressures and I have asked him to reduce his salt intake.  Matthew Howard,M.D.

## 2016-04-30 ENCOUNTER — Ambulatory Visit (INDEPENDENT_AMBULATORY_CARE_PROVIDER_SITE_OTHER): Payer: Medicare Other | Admitting: *Deleted

## 2016-04-30 DIAGNOSIS — I441 Atrioventricular block, second degree: Secondary | ICD-10-CM

## 2016-04-30 LAB — CUP PACEART REMOTE DEVICE CHECK
Brady Statistic AP VP Percent: 69 %
Brady Statistic AP VS Percent: 1 %
Brady Statistic AS VP Percent: 30 %
Brady Statistic RA Percent Paced: 69 %
Brady Statistic RV Percent Paced: 99 %
Date Time Interrogation Session: 20170724113231
Implantable Lead Implant Date: 20150420
Implantable Lead Location: 753859
Lead Channel Impedance Value: 400 Ohm
Lead Channel Impedance Value: 450 Ohm
Lead Channel Pacing Threshold Amplitude: 1 V
Lead Channel Pacing Threshold Pulse Width: 0.4 ms
Lead Channel Sensing Intrinsic Amplitude: 9.4 mV
Lead Channel Setting Pacing Amplitude: 2.5 V
Lead Channel Setting Pacing Pulse Width: 0.4 ms
Lead Channel Setting Sensing Sensitivity: 2 mV
MDC IDC LEAD IMPLANT DT: 20150420
MDC IDC LEAD LOCATION: 753860
MDC IDC MSMT BATTERY REMAINING LONGEVITY: 107 mo
MDC IDC MSMT BATTERY REMAINING PERCENTAGE: 95.5 %
MDC IDC MSMT BATTERY VOLTAGE: 2.99 V
MDC IDC MSMT LEADCHNL RA PACING THRESHOLD AMPLITUDE: 0.5 V
MDC IDC MSMT LEADCHNL RA SENSING INTR AMPL: 5 mV
MDC IDC MSMT LEADCHNL RV PACING THRESHOLD PULSEWIDTH: 0.4 ms
MDC IDC SET LEADCHNL RA PACING AMPLITUDE: 2 V
MDC IDC STAT BRADY AS VS PERCENT: 1 %
Pulse Gen Model: 2240
Pulse Gen Serial Number: 3013717

## 2016-04-30 NOTE — Progress Notes (Signed)
Remote pacemaker transmission.   

## 2016-05-02 ENCOUNTER — Encounter: Payer: Self-pay | Admitting: Cardiology

## 2016-07-17 ENCOUNTER — Encounter: Payer: Medicare Other | Admitting: Internal Medicine

## 2016-07-24 ENCOUNTER — Encounter: Payer: Self-pay | Admitting: Internal Medicine

## 2016-07-24 ENCOUNTER — Ambulatory Visit (INDEPENDENT_AMBULATORY_CARE_PROVIDER_SITE_OTHER): Payer: Medicare Other | Admitting: Internal Medicine

## 2016-07-24 VITALS — BP 148/70 | HR 62 | Temp 97.3°F | Ht 71.25 in | Wt 187.0 lb

## 2016-07-24 DIAGNOSIS — N401 Enlarged prostate with lower urinary tract symptoms: Secondary | ICD-10-CM | POA: Diagnosis not present

## 2016-07-24 DIAGNOSIS — I480 Paroxysmal atrial fibrillation: Secondary | ICD-10-CM | POA: Diagnosis not present

## 2016-07-24 DIAGNOSIS — Z0000 Encounter for general adult medical examination without abnormal findings: Secondary | ICD-10-CM

## 2016-07-24 DIAGNOSIS — Z7189 Other specified counseling: Secondary | ICD-10-CM

## 2016-07-24 DIAGNOSIS — Z23 Encounter for immunization: Secondary | ICD-10-CM | POA: Diagnosis not present

## 2016-07-24 DIAGNOSIS — R3915 Urgency of urination: Secondary | ICD-10-CM

## 2016-07-24 DIAGNOSIS — M1000 Idiopathic gout, unspecified site: Secondary | ICD-10-CM

## 2016-07-24 LAB — T4, FREE: Free T4: 0.96 ng/dL (ref 0.60–1.60)

## 2016-07-24 LAB — COMPREHENSIVE METABOLIC PANEL
ALT: 14 U/L (ref 0–53)
AST: 19 U/L (ref 0–37)
Albumin: 4.3 g/dL (ref 3.5–5.2)
Alkaline Phosphatase: 49 U/L (ref 39–117)
BUN: 23 mg/dL (ref 6–23)
CO2: 31 mEq/L (ref 19–32)
Calcium: 9.5 mg/dL (ref 8.4–10.5)
Chloride: 104 mEq/L (ref 96–112)
Creatinine, Ser: 1.09 mg/dL (ref 0.40–1.50)
GFR: 66.81 mL/min (ref >60.00)
Glucose, Bld: 103 mg/dL — ABNORMAL HIGH (ref 70–99)
Potassium: 4.5 mEq/L (ref 3.5–5.1)
Sodium: 141 mEq/L (ref 135–145)
Total Bilirubin: 0.8 mg/dL (ref 0.2–1.2)
Total Protein: 7.1 g/dL (ref 6.0–8.3)

## 2016-07-24 LAB — CBC WITH DIFFERENTIAL/PLATELET
BASOS ABS: 0 10*3/uL (ref 0.0–0.1)
BASOS PCT: 0.4 % (ref 0.0–3.0)
EOS ABS: 0.1 10*3/uL (ref 0.0–0.7)
Eosinophils Relative: 0.9 % (ref 0.0–5.0)
HEMATOCRIT: 38.5 % — AB (ref 39.0–52.0)
HEMOGLOBIN: 12.8 g/dL — AB (ref 13.0–17.0)
LYMPHS PCT: 32.3 % (ref 12.0–46.0)
Lymphs Abs: 2.2 10*3/uL (ref 0.7–4.0)
MCHC: 33.2 g/dL (ref 30.0–36.0)
MCV: 95.5 fl (ref 78.0–100.0)
MONOS PCT: 7.1 % (ref 3.0–12.0)
Monocytes Absolute: 0.5 10*3/uL (ref 0.1–1.0)
Neutro Abs: 4 10*3/uL (ref 1.4–7.7)
Neutrophils Relative %: 59.3 % (ref 43.0–77.0)
PLATELETS: 185 10*3/uL (ref 150.0–400.0)
RBC: 4.03 Mil/uL — ABNORMAL LOW (ref 4.22–5.81)
RDW: 16 % — AB (ref 11.5–15.5)
WBC: 6.8 10*3/uL (ref 4.0–10.5)

## 2016-07-24 NOTE — Assessment & Plan Note (Signed)
See social history Gave blank forms again

## 2016-07-24 NOTE — Assessment & Plan Note (Signed)
Paced but apparently low burden He prefers only ASA

## 2016-07-24 NOTE — Progress Notes (Signed)
Subjective:    Patient ID: Matthew Howard, male    DOB: [birth year], 82 y.o.   MRN: [id number]  HPI Here for Medicare wellness and follow up of chronic health conditions Reviewed form and advanced directives Reviewed other doctors No tobacco or alcohol Fell once--stepped in a hole. No injury No depression or anhedonia Vision is not as good--keeping up with doctor Ongoing hearing loss--uses aides Independent with instrumental ADLs Some memory problems--but he manages bills, etc  Doing well Still active inside and and outside the house  Keeps up with cardiologist Pacemaker in place---gets regular checks No palpitations No chest pain No SOB---- does get tired when working (no sig change in past 2 years) No edema  Sees the urologist yearly Voiding about the same Does okay in daytime--he is careful with urgency No blood  Sllight gout flare recently Cherry juice still relieves his symptoms  Current Outpatient Prescriptions on File Prior to Visit  Medication Sig Dispense Refill  . aspirin 81 MG EC tablet Take 81 mg by mouth daily.     . finasteride (PROSCAR) 5 MG tablet TAKE 1 TABLET BY MOUTH DAILY 90 tablet 3  . Tamsulosin HCl (FLOMAX) 0.4 MG CAPS Take 1 capsule (0.4 mg total) by mouth daily. 90 capsule 1   No current facility-administered medications on file prior to visit.     No Known Allergies  Past Medical History:  Diagnosis Date  . Atrial flutter (HCC)    not well documented  . Benign prostatic hypertrophy   . Gout   . Hyperlipidemia   . Symptomatic bradycardia    s/p dual chamber PPM implantation April 2015 [facility])    Past Surgical History:  Procedure Laterality Date  . CATARACT EXTRACTION    . PACEMAKER INSERTION  01-25-2014   STJ Assurity dual chamber pacemaker implanted by Dr [name] for symptomatic bradycardia  . PERMANENT PACEMAKER INSERTION N/A 01/25/2014   Procedure: PERMANENT PACEMAKER INSERTION;  Surgeon: Marinus Maw, MD;   Location: [facility] CATH LAB;  Service: Cardiovascular;  Laterality: N/A;  . TONSILLECTOMY      Family History  Problem Relation Age of Onset  . Cancer Mother   . Throat cancer Father     Social History   Social History  . Marital status: Widowed    Spouse name: Roberto Romanoski  . Number of children: 4  . Years of education: N/A   Occupational History  . retired     [employer]   Social History Main Topics  . Smoking status: Never Smoker  . Smokeless tobacco: Never Used  . Alcohol use Yes     Comment: rare etoh use  . Drug use: No  . Sexual activity: Not on file   Other Topics Concern  . Not on file   Social History Narrative   Widowed 4/15   No living will   No health care power of attorney--would want daughter [name] though   Would accept resuscitation but wouldn't want prolonged artificial ventilation   No tube feeds if cognitively unaware   Review of Systems Appetite is good Weight is stable Sleeps okay but interrupted by nocturia (2-3) Bowels are okay. No blood No skin rash or suspicious lesions. Gets itching with deodorant No sig back or joint pains    Objective:   Physical Exam  Constitutional: He is oriented to person, place, and time. He appears well-developed and well-nourished. No distress.  HENT:  Mouth/Throat: Oropharynx is clear and moist. No oropharyngeal exudate.  Neck: Normal range of motion. Neck supple. No thyromegaly present.  Cardiovascular: Normal rate, regular rhythm and intact distal pulses.  Exam reveals no gallop.   Faint pedal pulses Gr 2/6 systolic murmur at apex  Pulmonary/Chest: Effort normal and breath sounds normal. No respiratory distress. He has no wheezes. He has no rales.  Abdominal: Soft. There is no tenderness.  Musculoskeletal: He exhibits no tenderness.  1+ ankle edema  Lymphadenopathy:    He has no cervical adenopathy.  Neurological: He is alert and oriented to person, place, and time.  President---"Trump, Obama,  Bush" 100-93-? Wouldn't try the spelling Recall 2/3  Skin: No rash noted. No erythema.  Psychiatric: He has a normal mood and affect. His behavior is normal.          Assessment & Plan:

## 2016-07-24 NOTE — Addendum Note (Signed)
Addended by: Eual FinesBRIDGES, Caleigha Zale P on: 07/24/2016 01:09 PM   Modules accepted: Orders

## 2016-07-24 NOTE — Assessment & Plan Note (Signed)
I have personally reviewed the Medicare Annual Wellness questionnaire and have noted 1. The patient's medical and social history 2. Their use of alcohol, tobacco or illicit drugs 3. Their current medications and supplements 4. The patient's functional ability including ADL's, fall risks, home safety risks and hearing or visual             impairment. 5. Diet and physical activities 6. Evidence for depression or mood disorders  The patients weight, height, BMI and visual acuity have been recorded in the chart I have made referrals, counseling and provided education to the patient based review of the above and I have provided the pt with a written personalized care plan for preventive services.  I have provided you with a copy of your personalized plan for preventive services. Please take the time to review along with your updated medication list.  Discussed zostavax--will defer Flu vaccine today Stays active No cancer screening due to age

## 2016-07-24 NOTE — Assessment & Plan Note (Signed)
Generally does okay on dual therapy

## 2016-07-24 NOTE — Assessment & Plan Note (Signed)
Infrequent and cherry juice works

## 2016-07-24 NOTE — Progress Notes (Signed)
Pre visit review using our clinic review tool, if applicable. No additional management support is needed unless otherwise documented below in the visit note. 

## 2016-07-26 ENCOUNTER — Telehealth: Payer: Self-pay | Admitting: Internal Medicine

## 2016-07-26 NOTE — Telephone Encounter (Signed)
Patient returned Matthew Howard's call. °

## 2016-07-26 NOTE — Telephone Encounter (Signed)
Spoke to pt. See labs 

## 2016-07-30 ENCOUNTER — Ambulatory Visit (INDEPENDENT_AMBULATORY_CARE_PROVIDER_SITE_OTHER): Payer: Medicare Other | Admitting: *Deleted

## 2016-07-30 DIAGNOSIS — I441 Atrioventricular block, second degree: Secondary | ICD-10-CM

## 2016-07-30 NOTE — Progress Notes (Signed)
Remote pacemaker transmission.   

## 2016-07-31 ENCOUNTER — Encounter: Payer: Self-pay | Admitting: Cardiology

## 2016-08-30 LAB — CUP PACEART REMOTE DEVICE CHECK
Battery Remaining Longevity: 107 mo
Battery Remaining Percentage: 95.5 %
Battery Voltage: 2.99 V
Brady Statistic AP VS Percent: 1 %
Brady Statistic AS VS Percent: 1 %
Date Time Interrogation Session: 20171023121552
Implantable Lead Implant Date: 20150420
Implantable Lead Location: 753859
Implantable Pulse Generator Implant Date: 20150420
Lead Channel Impedance Value: 400 Ohm
Lead Channel Impedance Value: 460 Ohm
Lead Channel Pacing Threshold Amplitude: 0.5 V
Lead Channel Pacing Threshold Amplitude: 1 V
Lead Channel Pacing Threshold Pulse Width: 0.4 ms
Lead Channel Sensing Intrinsic Amplitude: 5 mV
Lead Channel Setting Pacing Amplitude: 2 V
Lead Channel Setting Pacing Amplitude: 2.5 V
Lead Channel Setting Pacing Pulse Width: 0.4 ms
Lead Channel Setting Sensing Sensitivity: 2 mV
MDC IDC LEAD IMPLANT DT: 20150420
MDC IDC LEAD LOCATION: 753860
MDC IDC MSMT LEADCHNL RA PACING THRESHOLD PULSEWIDTH: 0.4 ms
MDC IDC MSMT LEADCHNL RV SENSING INTR AMPL: 10.8 mV
MDC IDC PG SERIAL: 3013717
MDC IDC STAT BRADY AP VP PERCENT: 71 %
MDC IDC STAT BRADY AS VP PERCENT: 28 %
MDC IDC STAT BRADY RA PERCENT PACED: 71 %
MDC IDC STAT BRADY RV PERCENT PACED: 99 %

## 2016-09-08 DIAGNOSIS — J188 Other pneumonia, unspecified organism: Secondary | ICD-10-CM | POA: Diagnosis not present

## 2016-09-10 ENCOUNTER — Encounter: Payer: Self-pay | Admitting: Family Medicine

## 2016-09-10 ENCOUNTER — Ambulatory Visit (INDEPENDENT_AMBULATORY_CARE_PROVIDER_SITE_OTHER): Payer: Medicare Other | Admitting: Family Medicine

## 2016-09-10 VITALS — BP 140/60 | HR 74 | Temp 98.4°F | Wt 187.8 lb

## 2016-09-10 DIAGNOSIS — J189 Pneumonia, unspecified organism: Secondary | ICD-10-CM

## 2016-09-10 DIAGNOSIS — R600 Localized edema: Secondary | ICD-10-CM | POA: Diagnosis not present

## 2016-09-10 HISTORY — DX: Localized edema: R60.0

## 2016-09-10 NOTE — Progress Notes (Signed)
Pre visit review using our clinic review tool, if applicable. No additional management support is needed unless otherwise documented below in the visit note. 

## 2016-09-10 NOTE — Assessment & Plan Note (Addendum)
Bibasilar haziness by [facility] - ?PNA vs edema. Responding to levaquin - with improving less productive cough. rec finish course.

## 2016-09-10 NOTE — Progress Notes (Signed)
BP 140/60   Pulse 74   Temp 98.4 F (36.9 C) (Oral)   Wt 187 lb 12 oz (85.2 kg)   SpO2 98%   BMI 26.00 kg/m    CC: [facility] f/u visit Subjective:    Patient ID: Matthew Howard, male    DOB: 06-10-1919, 82 y.o.   MRN: [id number]  HPI: Matthew Howard is a 82 y.o. male presenting on 09/10/2016 for Pneumonia (per [facility]--needs follow up)   Here with Matthew Howard his daughter.  Almost 2 wk h/o productive cough since thanksgiving, over the past day cough has improved. Denies dyspnea, fevers/chills, fatigue, chest pain. No orthopnea. Weight stable. Has been using mucinex at night which helps.   Saw [facility] over weekend and xray showed bilateral lower lung haziness - started on levaquin course for "touch of pneumonia" also noticed swelling of ankles, endorses more trouble walking due to unsteadiness and had another fall last night (tripped over chair while getting to bed in the dark).   Lives at home alone, does not use cane. He does like his salt. Denies sick contacts. Not around smokers.  Denies h/o asthma, COPD.  H/o afib - stable on aspirin. Has pacemaker as well.   Relevant past medical, surgical, family and social history reviewed and updated as indicated. Interim medical history since our last visit reviewed. Allergies and medications reviewed and updated. Current Outpatient Prescriptions on File Prior to Visit  Medication Sig  . aspirin 81 MG EC tablet Take 81 mg by mouth daily.   . finasteride (PROSCAR) 5 MG tablet TAKE 1 TABLET BY MOUTH DAILY  . Tamsulosin HCl (FLOMAX) 0.4 MG CAPS Take 1 capsule (0.4 mg total) by mouth daily.   No current facility-administered medications on file prior to visit.    Past Medical History:  Diagnosis Date  . Atrial flutter (HCC)    not well documented  . Benign prostatic hypertrophy   . Gout   . Hyperlipidemia   . Symptomatic bradycardia    s/p dual chamber PPM implantation April 2015 [facility]   Past Surgical History:  Procedure  Laterality Date  . CATARACT EXTRACTION    . PACEMAKER INSERTION  01-25-2014   STJ Assurity dual chamber pacemaker implanted by Dr Ladona Ridgelaylor for symptomatic bradycardia  . PERMANENT PACEMAKER INSERTION N/A 01/25/2014   Procedure: PERMANENT PACEMAKER INSERTION;  Surgeon: Marinus MawGregg W Taylor, MD;  [facility] C[facility];  Service: Cardiovascular;  Laterality: N/A;  . TONSILLECTOMY      Review of Systems Per HPI unless specifically indicated in ROS section     Objective:    BP 140/60   Pulse 74   Temp 98.4 F (36.9 C) (Oral)   Wt 187 lb 12 oz (85.2 kg)   SpO2 98%   BMI 26.00 kg/m   Wt Readings from Last 3 Encounters:  09/10/16 187 lb 12 oz (85.2 kg)  07/24/16 187 lb (84.8 kg)  01/27/16 186 lb 9.6 oz (84.6 kg)    Physical Exam  Constitutional: He appears well-developed and well-nourished. No distress.  HENT:  Head: Normocephalic and atraumatic.  Right Ear: Hearing and external ear normal.  Left Ear: Hearing and external ear normal.  Nose: Nose normal. No mucosal edema (pale) or rhinorrhea. Right sinus exhibits no maxillary sinus tenderness and no frontal sinus tenderness. Left sinus exhibits no maxillary sinus tenderness and no frontal sinus tenderness.  Mouth/Throat: Uvula is midline, oropharynx is clear and moist and mucous membranes are normal. No oropharyngeal exudate, posterior oropharyngeal  edema, posterior oropharyngeal erythema or tonsillar abscesses.  Hearing aides in place  Eyes: Conjunctivae and EOM are normal. Pupils are equal, round, and reactive to light. No scleral icterus.  Neck: Normal range of motion. Neck supple.  Cardiovascular: Normal rate, normal heart sounds and intact distal pulses.  An irregular rhythm present.  No murmur heard. Pulmonary/Chest: Effort normal and breath sounds normal. No respiratory distress. He has no wheezes. He has no rales.  clear  Abdominal: Soft. Bowel sounds are normal. He exhibits no distension and no mass. There is no tenderness. There is  no rebound and no guarding.  Musculoskeletal: He exhibits edema (1+ at ankles and feet).  2+ DP bilaterally  Lymphadenopathy:    He has no cervical adenopathy.  Skin: Skin is warm and dry. No rash noted.  Nursing note and vitals reviewed.  Lab Results  Component Value Date   CREATININE 1.09 07/24/2016    Lab Results  Component Value Date   NA 141 07/24/2016   K 4.5 07/24/2016   CL 104 07/24/2016   CO2 31 07/24/2016   Lab Results  Component Value Date   ALT 14 07/24/2016   AST 19 07/24/2016   ALKPHOS 49 07/24/2016   BILITOT 0.8 07/24/2016      Assessment & Plan:   Problem List Items Addressed This Visit    CAP (community acquired pneumonia) - Primary    Bibasilar haziness [facility] - ?PNA vs edema. Responding to levaquin - with improving less productive cough. rec finish course.       Relevant Medications   levofloxacin (LEVAQUIN) 500 MG tablet   Pedal edema    New - without significant change in weight and overall clear lung exam. Recommended conservative treatment of limiting salt intake, increased water during the day, elevation of legs, and discussed compression stockings - pt will start with buying tight knee high stockings OTC. Update if not improving with this, consider Rx compression stockings or HCTZ PRN. Pt and daughter agree with plan.          Follow up plan: Return if symptoms worsen or fail to improve.  Eustaquio BoydenJavier Wasyl Dornfeld, MD

## 2016-09-10 NOTE — Patient Instructions (Signed)
Finish levaquin course.  Let's continue to monitor leg swelling - to see if it improves on its own. What you can do is increase water intake, limit salt, elevate legs during the day, and consider compression stockings or tight long socks.  Let us know if swelling is worsening, if you get more short winded, or if cough is worsening instead of improving daily, or fever >101.

## 2016-09-10 NOTE — Assessment & Plan Note (Addendum)
New - without significant change in weight and overall clear lung exam. Recommended conservative treatment of limiting salt intake, increased water during the day, elevation of legs, and discussed compression stockings - pt will start with buying tight knee high stockings OTC. Update if not improving with this, consider Rx compression stockings or HCTZ PRN. Pt and daughter agree with plan.

## 2016-09-11 ENCOUNTER — Other Ambulatory Visit: Payer: Self-pay | Admitting: Internal Medicine

## 2016-09-24 ENCOUNTER — Encounter: Payer: Self-pay | Admitting: Internal Medicine

## 2016-09-24 ENCOUNTER — Ambulatory Visit (INDEPENDENT_AMBULATORY_CARE_PROVIDER_SITE_OTHER): Payer: Medicare Other | Admitting: Internal Medicine

## 2016-09-24 VITALS — BP 140/70 | HR 61 | Temp 97.2°F | Wt 177.0 lb

## 2016-09-24 DIAGNOSIS — R609 Edema, unspecified: Secondary | ICD-10-CM

## 2016-09-24 MED ORDER — FUROSEMIDE 20 MG PO TABS: ORAL_TABLET | ORAL | 0 refills | Status: DC

## 2016-09-24 NOTE — Patient Instructions (Signed)
Edema  Edema is an abnormal buildup of fluids. It is more common in your legs and thighs. Painless swelling of the feet and ankles is more likely as a person ages. It also is common in looser skin, like around your eyes.  Follow these instructions at home:  ? Keep the affected body part above the level of the heart while lying down.  ? Do not sit still or stand for a long time.  ? Do not put anything right under your knees when you lie down.  ? Do not wear tight clothes on your upper legs.  ? Exercise your legs to help the puffiness (swelling) go down.  ? Wear elastic bandages or support stockings as told by your doctor.  ? A low-salt diet may help lessen the puffiness.  ? Only take medicine as told by your doctor.  Contact a doctor if:  ? Treatment is not working.  ? You have heart, liver, or kidney disease and notice that your skin looks puffy or shiny.  ? You have puffiness in your legs that does not get better when you raise your legs.  ? You have sudden weight gain for no reason.  Get help right away if:  ? You have shortness of breath or chest pain.  ? You cannot breathe when you lie down.  ? You have pain, redness, or warmth in the areas that are puffy.  ? You have heart, liver, or kidney disease and get edema all of a sudden.  ? You have a fever and your symptoms get worse all of a sudden.  This information is not intended to replace advice given to you by your health care provider. Make sure you discuss any questions you have with your health care provider.  Document Released: 03/12/2008 Document Revised: 03/01/2016 Document Reviewed: 07/17/2013  Elsevier Interactive Patient Education ? 2017 [employer]

## 2016-09-24 NOTE — Progress Notes (Signed)
Subjective:    Patient ID: Matthew Howard, male    DOB: Oct 28, 1918, [age] y.o.   MRN: [id number]  HPI  Pt presents to the clinic today with c/o swelling of the right lower leg. This started 2 weeks ago. It starts in the right foot and extends up to his shin. The swelling is painful. He describes it as a tightness. It does seem worse with weight bearing. He denies redness or warmth of the skin. He has not taken anything OTC for this. He denies any recent fall or trauma to the area. He has a history of gout but reports it typically does not feel like this. He was recently on Levaquin for CAP.  Review of Systems      Past Medical History:  Diagnosis Date  . Atrial flutter (HCC)    not well documented  . Benign prostatic hypertrophy   . Gout   . Hyperlipidemia   . Symptomatic bradycardia    s/p dual chamber PPM implantation April 2015 [facility]([employer])    Current Outpatient Prescriptions  Medication Sig Dispense Refill  . aspirin 81 MG EC tablet Take 81 mg by mouth daily.     . finasteride (PROSCAR) 5 MG tablet TAKE 1 TABLET BY MOUTH DAILY 90 tablet 3  . Tamsulosin HCl (FLOMAX) 0.4 MG CAPS Take 1 capsule (0.4 mg total) by mouth daily. 90 capsule 1  . furosemide (LASIX) 20 MG tablet Take 1 tab daily x 3 days 3 tablet 0   No current facility-administered medications for this visit.     No Known Allergies  Family History  Problem Relation Age of Onset  . Cancer Mother   . Throat cancer Father     Social History   Social History  . Marital status: Widowed    Spouse name: Philomena CourseLola Browning  . Number of children: 4  . Years of education: N/A   Occupational History  . retired     [employer]   Social History Main Topics  . Smoking status: Never Smoker  . Smokeless tobacco: Never Used  . Alcohol use Yes     Comment: rare etoh use  . Drug use: No  . Sexual activity: Not on file   Other Topics Concern  . Not on file   Social History Narrative   Widowed 4/15   No  living will   No health care power of attorney--would want daughter [name] though   Would accept resuscitation but wouldn't want prolonged artificial ventilation   No tube feeds if cognitively unaware     Constitutional: Denies fever, malaise, fatigue, headache or abrupt weight changes.  Musculoskeletal: Pt reports swelling of right ankle/foot. Denies difficulty with gait, muscle pain.  Skin: Denies redness, rashes, lesions or ulcercations.    No other specific complaints in a complete review of systems (except as listed in HPI above).  Objective:   Physical Exam   BP 140/70   Pulse 61   Temp 97.2 F (36.2 C) (Oral)   Wt 177 lb (80.3 kg)   SpO2 99%   BMI 24.51 kg/m  Wt Readings from Last 3 Encounters:  09/24/16 177 lb (80.3 kg)  09/10/16 187 lb 12 oz (85.2 kg)  07/24/16 187 lb (84.8 kg)    General: Appears his stated age, in NAD. Skin: Warm, dry and intact. Fungal toenails noted. No redness or warmth of RLE. Heart: Right pedal pulse 2+. Negative Homan's sign. Musculoskeletal: Decreased plantar flexion, dorsi flexion and rotation of  the right ankle secondary to pain. 1+ pitting edema RLE. No pain with palpation of the achilles tendon. Gait slow but steady.  BMET    Component Value Date/Time   NA 141 07/24/2016 1226   K 4.5 07/24/2016 1226   CL 104 07/24/2016 1226   CO2 31 07/24/2016 1226   GLUCOSE 103 (H) 07/24/2016 1226   BUN 23 07/24/2016 1226   CREATININE 1.09 07/24/2016 1226   CALCIUM 9.5 07/24/2016 1226   GFRNONAA 74.87 01/06/2010 0000   GFRAA 81 11/01/2008 1534    Lipid Panel     Component Value Date/Time   CHOL 141 11/01/2008 1534   TRIG 104 11/01/2008 1534   HDL 43.4 11/01/2008 1534   CHOLHDL 3.2 CALC 11/01/2008 1534   VLDL 21 11/01/2008 1534   LDLCALC 77 11/01/2008 1534    CBC    Component Value Date/Time   WBC 6.8 07/24/2016 1226   RBC 4.03 (L) 07/24/2016 1226   HGB 12.8 (L) 07/24/2016 1226   HCT 38.5 (L) 07/24/2016 1226   PLT 185.0  07/24/2016 1226   MCV 95.5 07/24/2016 1226   MCHC 33.2 07/24/2016 1226   RDW 16.0 (H) 07/24/2016 1226   LYMPHSABS 2.2 07/24/2016 1226   MONOABS 0.5 07/24/2016 1226   EOSABS 0.1 07/24/2016 1226   BASOSABS 0.0 07/24/2016 1226    Hgb A1C No results found for: HGBA1C         Assessment & Plan:   RLE edema:  My first thought was gout vs achilles tendonitis given recent fluroquinolone No redness or warmth of the skin No pain with palpation of the achilles Will try Lasix 20 mg daily x 3 days Discussed elevation of the RLE Tylenol Arthritis 650 TID prn  Return precautions discussed, he will call me Thursday morning if no improvement  BAITY, REGINA, NP

## 2016-10-03 ENCOUNTER — Ambulatory Visit (INDEPENDENT_AMBULATORY_CARE_PROVIDER_SITE_OTHER): Payer: Medicare Other | Admitting: Family Medicine

## 2016-10-03 ENCOUNTER — Encounter: Payer: Self-pay | Admitting: Family Medicine

## 2016-10-03 VITALS — BP 144/44 | HR 71 | Wt 181.0 lb

## 2016-10-03 DIAGNOSIS — R059 Cough, unspecified: Secondary | ICD-10-CM

## 2016-10-03 DIAGNOSIS — R05 Cough: Secondary | ICD-10-CM | POA: Diagnosis not present

## 2016-10-03 DIAGNOSIS — M25571 Pain in right ankle and joints of right foot: Secondary | ICD-10-CM | POA: Diagnosis not present

## 2016-10-03 NOTE — Patient Instructions (Addendum)
Please continue your Mucinex DM twice a day, I will check on you in 2 days (friday afternoon) to see if cough is better.

## 2016-10-03 NOTE — Progress Notes (Signed)
   Subjective:    Patient ID: Matthew Howard, male    DOB: 09-Dec-1918, 82 y.o.   MRN: [id number]  HPI This is a 82 yo male who presents today with swelling of right foot. Was seen 12/18 and prescribed lasix. This seemed to make him void less and he stopped it. He has been drinking cherry juice and has had about 2 quarts. His pain and swelling have resolved. He thinks it was related to gout. No known dietary triggers.   Was seen 09/10/16 with pneumonia. Was treated with levaquin. Has continued to have lingering cough which is productive of small amount yellow phlegm. Gets good relief with Mucinex DM for about half a day. No SOB, no wheeze. No fever or nasal drainage. Otherwise feels fine. Thinks he needs another antibiotic.   Past Medical History:  Diagnosis Date  . Atrial flutter (HCC)    not well documented  . Benign prostatic hypertrophy   . Gout   . Hyperlipidemia   . Symptomatic bradycardia    s/p dual chamber PPM implantation April 2015 [facility]([employer])   Past Surgical History:  Procedure Laterality Date  . CATARACT EXTRACTION    . PACEMAKER INSERTION  01-25-2014   STJ Assurity dual chamber pacemaker implanted by Dr Ladona Ridgelaylor for symptomatic bradycardia  . PERMANENT PACEMAKER INSERTION N/A 01/25/2014   Procedure: PERMANENT PACEMAKER INSERTION;  Surgeon: Marinus MawGregg W Taylor, MD;  [facility];  Service: Cardiovascular;  Laterality: N/A;  . TONSILLECTOMY        Review of Systems Per HPI    Objective:   Physical Exam  Constitutional: He is oriented to person, place, and time. He appears well-developed and well-nourished. No distress.  HENT:  Head: Normocephalic and atraumatic.  Mouth/Throat: Oropharynx is clear and moist.  Neck: Normal range of motion. Neck supple.  Cardiovascular: Normal rate.  An irregular rhythm present.  Pulmonary/Chest: Effort normal and breath sounds normal. No respiratory distress. He has no wheezes. He has no rales.  Musculoskeletal:  Right  ankle with good ROM, trace edema, no erythema.   Neurological: He is alert and oriented to person, place, and time.  Skin: Skin is warm and dry. He is not diaphoretic.  Psychiatric: He has a normal mood and affect. His behavior is normal. Judgment and thought content normal.  Vitals reviewed.        BP (!) 144/44   Pulse 71   Wt 181 lb (82.1 kg)   SpO2 98%   BMI 25.07 kg/m  Wt Readings from Last 3 Encounters:  10/03/16 181 lb (82.1 kg)  09/24/16 177 lb (80.3 kg)  09/10/16 187 lb 12 oz (85.2 kg)    Assessment & Plan:  1. Acute right ankle pain - resolved per patient  2. Cough - exam reassuring and he feels well, suspect cough related to airway irritation related to pneumonia which has resolved. He is getting good relief with Mucinex DM, encouraged him to continue twice a day and we will check on him in two days to see how he is feeling (he reports difficulty hearing when he calls to office).    Olean Reeeborah Gessner, FNP-BC  [facility]  10/03/2016 10:04 AM

## 2016-10-05 ENCOUNTER — Telehealth: Payer: Self-pay | Admitting: Family Medicine

## 2016-10-05 NOTE — Telephone Encounter (Signed)
Called to check on patient. Still has cough, relieved with mucinex, no worse, no fever, no SOB. Asked him to keep me posted if not better in 4-5 days.

## 2016-10-16 ENCOUNTER — Encounter: Payer: Self-pay | Admitting: Internal Medicine

## 2016-10-16 ENCOUNTER — Ambulatory Visit (INDEPENDENT_AMBULATORY_CARE_PROVIDER_SITE_OTHER)
Admission: RE | Admit: 2016-10-16 | Discharge: 2016-10-16 | Disposition: A | Discharge status: Home / Self Care | Payer: Medicare Other | Source: Ambulatory Visit | Attending: Internal Medicine | Admitting: Internal Medicine

## 2016-10-16 ENCOUNTER — Ambulatory Visit (INDEPENDENT_AMBULATORY_CARE_PROVIDER_SITE_OTHER): Payer: Medicare Other | Admitting: Internal Medicine

## 2016-10-16 VITALS — BP 136/70 | HR 69 | Temp 97.5°F | Resp 22 | Wt 183.0 lb

## 2016-10-16 DIAGNOSIS — J189 Pneumonia, unspecified organism: Secondary | ICD-10-CM

## 2016-10-16 MED ORDER — BENZONATATE 200 MG PO CAPS: 200.0000 mg | ORAL_CAPSULE | Freq: Three times a day (TID) | ORAL | 0 refills | Status: DC | PRN

## 2016-10-16 MED ORDER — PREDNISONE 20 MG PO TABS: 40.0000 mg | ORAL_TABLET | Freq: Every day | ORAL | 0 refills | Status: DC

## 2016-10-16 NOTE — Progress Notes (Signed)
   Subjective:    Patient ID: Matthew Howard, male    DOB: January 15, 1919, 83 y.o.   MRN: [id number]  HPI Here due to cough---still doesn't feel right after pneumonia diagnosis last month  Still coughing and productive of sputum Did feels some better after the antibiotic--but not resolved Ongoing stable cough-- sputum more common during the day Cough is infrequent  No fever No SOB Doesn't feel sick  Using nyquil day and night--may be helping some  Current Outpatient Prescriptions on File Prior to Visit  Medication Sig Dispense Refill  . aspirin 81 MG EC tablet Take 81 mg by mouth daily.     . finasteride (PROSCAR) 5 MG tablet TAKE 1 TABLET BY MOUTH DAILY 90 tablet 3  . Tamsulosin HCl (FLOMAX) 0.4 MG CAPS Take 1 capsule (0.4 mg total) by mouth daily. 90 capsule 1   No current facility-administered medications on file prior to visit.     No Known Allergies  Past Medical History:  Diagnosis Date  . Atrial flutter (HCC)    not well documented  . Benign prostatic hypertrophy   . Gout   . Hyperlipidemia   . Symptomatic bradycardia    s/p dual chamber PPM implantation April 2015 [facility]([employer])    Past Surgical History:  Procedure Laterality Date  . CATARACT EXTRACTION    . PACEMAKER INSERTION  01-25-2014   STJ Assurity dual chamber pacemaker implanted by Dr Ladona Ridgelaylor for symptomatic bradycardia  . PERMANENT PACEMAKER INSERTION N/A 01/25/2014   Procedure: PERMANENT PACEMAKER INSERTION;  Surgeon: Marinus MawGregg W Taylor, MD;  [facility];  Service: Cardiovascular;  Laterality: N/A;  . TONSILLECTOMY      Family History  Problem Relation Age of Onset  . Cancer Mother   . Throat cancer Father     Social History   Social History  . Marital status: Widowed    Spouse name: Matthew Howard  . Number of children: 4  . Years of education: N/A   Occupational History  . retired     [employer]   Social History Main Topics  . Smoking status: Never Smoker  . Smokeless  tobacco: Never Used  . Alcohol use Yes     Comment: rare etoh use  . Drug use: No  . Sexual activity: Not on file   Other Topics Concern  . Not on file   Social History Narrative   Widowed 4/15   No living will   No health care power of attorney--would want daughter Matthew Howard though   Would accept resuscitation but wouldn't want prolonged artificial ventilation   No tube feeds if cognitively unaware   Review of Systems Appetite is good Some trouble sleeping at times--but usually does okay Edema is better No chest pain No palpitatoins     Objective:   Physical Exam  Constitutional: He appears well-nourished. No distress.  Neck: Normal range of motion. No thyromegaly present.  Cardiovascular: Normal rate.  Exam reveals no gallop.   Irregular Soft systolic murmur towards apex  Pulmonary/Chest: No respiratory distress. He has no rales.  Fair air movement Mild expiratory wheeze but not tight  Lymphadenopathy:    He has no cervical adenopathy.          Assessment & Plan:

## 2016-10-16 NOTE — Patient Instructions (Signed)
We will call you with the x-ray report--later today or in the morning. Please stop the nyquil Take the prednisone for 3 days and use the benzonatate (little yellow pills) as needed for cough.

## 2016-10-16 NOTE — Progress Notes (Signed)
Pre visit review using our clinic review tool, if applicable. No additional management support is needed unless otherwise documented below in the visit note. 

## 2016-10-16 NOTE — Assessment & Plan Note (Addendum)
Clinically seems better but still coughing CXR shows some mild interstitial changes bilaterally--but seems stable since ~3 years ago Will try 3 days of prednisone Benzonatate prn for cough

## 2016-10-29 ENCOUNTER — Ambulatory Visit (INDEPENDENT_AMBULATORY_CARE_PROVIDER_SITE_OTHER): Payer: Medicare Other | Admitting: *Deleted

## 2016-10-29 DIAGNOSIS — I441 Atrioventricular block, second degree: Secondary | ICD-10-CM | POA: Diagnosis not present

## 2016-10-29 NOTE — Progress Notes (Signed)
Remote pacemaker transmission.   

## 2016-10-30 LAB — CUP PACEART REMOTE DEVICE CHECK
Brady Statistic AP VP Percent: 66 %
Brady Statistic AS VP Percent: 33 %
Brady Statistic RA Percent Paced: 66 %
Implantable Lead Implant Date: 20150420
Implantable Lead Location: 753859
Implantable Lead Location: 753860
Lead Channel Impedance Value: 480 Ohm
Lead Channel Pacing Threshold Pulse Width: 0.4 ms
Lead Channel Sensing Intrinsic Amplitude: 12 mV
Lead Channel Setting Pacing Amplitude: 2 V
Lead Channel Setting Pacing Amplitude: 2.5 V
MDC IDC LEAD IMPLANT DT: 20150420
MDC IDC MSMT BATTERY REMAINING LONGEVITY: 110 mo
MDC IDC MSMT BATTERY REMAINING PERCENTAGE: 95.5 %
MDC IDC MSMT BATTERY VOLTAGE: 2.99 V
MDC IDC MSMT LEADCHNL RA IMPEDANCE VALUE: 450 Ohm
MDC IDC MSMT LEADCHNL RA PACING THRESHOLD AMPLITUDE: 0.5 V
MDC IDC MSMT LEADCHNL RA SENSING INTR AMPL: 5 mV
MDC IDC MSMT LEADCHNL RV PACING THRESHOLD AMPLITUDE: 1 V
MDC IDC MSMT LEADCHNL RV PACING THRESHOLD PULSEWIDTH: 0.4 ms
MDC IDC PG IMPLANT DT: 20150420
MDC IDC PG SERIAL: 3013717
MDC IDC SESS DTM: 20180122085426
MDC IDC SET LEADCHNL RV PACING PULSEWIDTH: 0.4 ms
MDC IDC SET LEADCHNL RV SENSING SENSITIVITY: 2 mV
MDC IDC STAT BRADY AP VS PERCENT: 1 %
MDC IDC STAT BRADY AS VS PERCENT: 1 %
MDC IDC STAT BRADY RV PERCENT PACED: 99 %

## 2016-10-31 ENCOUNTER — Encounter: Payer: Self-pay | Admitting: Cardiology

## 2017-01-10 ENCOUNTER — Encounter: Payer: Self-pay | Admitting: Internal Medicine

## 2017-01-28 ENCOUNTER — Ambulatory Visit (INDEPENDENT_AMBULATORY_CARE_PROVIDER_SITE_OTHER): Payer: Medicare Other | Admitting: Internal Medicine

## 2017-01-28 ENCOUNTER — Encounter: Payer: Self-pay | Admitting: Internal Medicine

## 2017-01-28 VITALS — BP 138/78 | HR 63 | Ht 71.0 in | Wt 183.0 lb

## 2017-01-28 DIAGNOSIS — I441 Atrioventricular block, second degree: Secondary | ICD-10-CM

## 2017-01-28 DIAGNOSIS — I481 Persistent atrial fibrillation: Secondary | ICD-10-CM | POA: Diagnosis not present

## 2017-01-28 DIAGNOSIS — I4819 Other persistent atrial fibrillation: Secondary | ICD-10-CM

## 2017-01-28 DIAGNOSIS — Z950 Presence of cardiac pacemaker: Secondary | ICD-10-CM

## 2017-01-28 NOTE — Progress Notes (Signed)
HPI [name] returns today for followup. He is a pleasant 83 yo man with symptomatic bradycardia, s/p PPM with HTN and atrial arrhythmias including atrial fib. In the interim, he has been stable. He denies chest pain, sob, or syncope. He denies palpitations. He has not had edema. He did not feel his atrial fib. No Known Allergies   Current Outpatient Prescriptions  Medication Sig Dispense Refill  . aspirin 81 MG EC tablet Take 81 mg by mouth daily.     . finasteride (PROSCAR) 5 MG tablet TAKE 1 TABLET BY MOUTH DAILY 90 tablet 3  . Tamsulosin HCl (FLOMAX) 0.4 MG CAPS Take 1 capsule (0.4 mg total) by mouth daily. 90 capsule 1   No current facility-administered medications for this visit.      Past Medical History:  Diagnosis Date  . Atrial flutter (HCC)    not well documented  . Benign prostatic hypertrophy   . Gout   . Hyperlipidemia   . Symptomatic bradycardia    s/p dual chamber PPM implantation April 2015 [facility] [employer])    ROS:   All systems reviewed and negative except as noted in the HPI.   Past Surgical History:  Procedure Laterality Date  . CATARACT EXTRACTION    . PACEMAKER INSERTION  01-25-2014   STJ Assurity dual chamber pacemaker implanted by Dr [name] for symptomatic bradycardia  . PERMANENT PACEMAKER INSERTION N/A 01/25/2014   Procedure: PERMANENT PACEMAKER INSERTION;  Surgeon: Marinus Maw, MD;  Location: [facility] CATH LAB;  Service: Cardiovascular;  Laterality: N/A;  . TONSILLECTOMY       Family History  Problem Relation Age of Onset  . Cancer Mother   . Throat cancer Father      Social History   Social History  . Marital status: Widowed    Spouse name: Matthew Howard  . Number of children: 4  . Years of education: N/A   Occupational History  . retired     [employer]   Social History Main Topics  . Smoking status: Never Smoker  . Smokeless tobacco: Never Used  . Alcohol use Yes     Comment: rare etoh use  . Drug use: No  .  Sexual activity: Not on file   Other Topics Concern  . Not on file   Social History Narrative   Widowed 4/15   No living will   No health care power of attorney--would want daughter Matthew Howard though   Would accept resuscitation but wouldn't want prolonged artificial ventilation   No tube feeds if cognitively unaware     BP 138/78   Pulse 63   Ht  (1.803 m)   Wt 183 lb (83 kg)   SpO2 97%   BMI 25.52 kg/m   Physical Exam:  Well appearing 83 yo man, NAD HEENT: Unremarkable Neck:  6 cm JVD, no thyromegally Back:  No CVA tenderness Lungs:  Clear with no wheezes HEART:  Regular rate rhythm, no murmurs, no rubs, no clicks Abd:  soft, positive bowel sounds, no organomegally, no rebound, no guarding Ext:  2 plus pulses, no edema, no cyanosis, no clubbing Skin:  No rashes no nodules Neuro:  CN II through XII intact, motor grossly intact  DEVICE  Normal device function.  See PaceArt for details.   ECG - nsr with ventricular pacing  Assess/Plan: 1.PAF - he is currently back in NSR. I have recommended he start systemic anti-coagulation in the past but he is not  interested at this time.  2. PPM - his [employer] device is working normally. Will recheck in several months. 3. HTN - he has elevated systolic pressures and I have asked him to reduce his salt intake. 4. Syncope - he has had none since his PPM was placed. He did note some dizziness when he had been sick with pneumonia but this has resolved.  Leonia Reeves.D.

## 2017-01-28 NOTE — Patient Instructions (Signed)
Medication Instructions:  Your physician recommends that you continue on your current medications as directed. Please refer to the Current Medication list given to you today.   Labwork: None   Testing/Procedures: None  Follow-Up: Your physician wants you to follow-up in: 1 year with Dr Taylor (April 2019).  You will receive a reminder letter in the mail two months in advance. If you don't receive a letter, please call our office to schedule the follow-up appointment.   Any Other Special Instructions Will Be Listed Below (If Applicable).  Continue your remote device checks from home.    If you need a refill on your cardiac medications before your next appointment, please call your pharmacy.   

## 2017-01-31 LAB — CUP PACEART INCLINIC DEVICE CHECK
Brady Statistic RA Percent Paced: 66 %
Date Time Interrogation Session: 20180423133957
Implantable Lead Implant Date: 20150420
Implantable Lead Location: 753860
Lead Channel Impedance Value: 437.5 Ohm
Lead Channel Impedance Value: 450 Ohm
Lead Channel Pacing Threshold Amplitude: 1 V
Lead Channel Pacing Threshold Pulse Width: 0.4 ms
Lead Channel Sensing Intrinsic Amplitude: 9.4 mV
Lead Channel Setting Sensing Sensitivity: 2 mV
MDC IDC LEAD IMPLANT DT: 20150420
MDC IDC LEAD LOCATION: 753859
MDC IDC MSMT BATTERY VOLTAGE: 2.99 V
MDC IDC MSMT LEADCHNL RA PACING THRESHOLD AMPLITUDE: 0.5 V
MDC IDC MSMT LEADCHNL RA SENSING INTR AMPL: 5 mV
MDC IDC MSMT LEADCHNL RV PACING THRESHOLD PULSEWIDTH: 0.4 ms
MDC IDC PG IMPLANT DT: 20150420
MDC IDC SET LEADCHNL RA PACING AMPLITUDE: 2 V
MDC IDC SET LEADCHNL RV PACING AMPLITUDE: 2.5 V
MDC IDC SET LEADCHNL RV PACING PULSEWIDTH: 0.4 ms
MDC IDC STAT BRADY RV PERCENT PACED: 99.45 %
Pulse Gen Serial Number: 3013717

## 2017-04-29 ENCOUNTER — Ambulatory Visit (INDEPENDENT_AMBULATORY_CARE_PROVIDER_SITE_OTHER): Payer: Medicare Other | Admitting: *Deleted

## 2017-04-29 DIAGNOSIS — I441 Atrioventricular block, second degree: Secondary | ICD-10-CM | POA: Diagnosis not present

## 2017-04-30 NOTE — Progress Notes (Signed)
Remote pacemaker transmission.   

## 2017-05-02 ENCOUNTER — Encounter: Payer: Self-pay | Admitting: Cardiology

## 2017-05-06 LAB — CUP PACEART REMOTE DEVICE CHECK
Battery Remaining Percentage: 95.5 %
Battery Voltage: 2.98 V
Brady Statistic AP VP Percent: 66 %
Brady Statistic AP VS Percent: 1 %
Brady Statistic AS VS Percent: 1 %
Brady Statistic RV Percent Paced: 99 %
Implantable Lead Implant Date: 20150420
Implantable Lead Location: 753860
Implantable Pulse Generator Implant Date: 20150420
Lead Channel Pacing Threshold Amplitude: 0.5 V
Lead Channel Pacing Threshold Amplitude: 1 V
Lead Channel Pacing Threshold Pulse Width: 0.4 ms
Lead Channel Pacing Threshold Pulse Width: 0.4 ms
Lead Channel Setting Sensing Sensitivity: 2 mV
MDC IDC LEAD IMPLANT DT: 20150420
MDC IDC LEAD LOCATION: 753859
MDC IDC MSMT BATTERY REMAINING LONGEVITY: 109 mo
MDC IDC MSMT LEADCHNL RA IMPEDANCE VALUE: 440 Ohm
MDC IDC MSMT LEADCHNL RA SENSING INTR AMPL: 5 mV
MDC IDC MSMT LEADCHNL RV IMPEDANCE VALUE: 480 Ohm
MDC IDC MSMT LEADCHNL RV SENSING INTR AMPL: 8.5 mV
MDC IDC PG SERIAL: 3013717
MDC IDC SESS DTM: 20180723100950
MDC IDC SET LEADCHNL RA PACING AMPLITUDE: 2 V
MDC IDC SET LEADCHNL RV PACING AMPLITUDE: 2.5 V
MDC IDC SET LEADCHNL RV PACING PULSEWIDTH: 0.4 ms
MDC IDC STAT BRADY AS VP PERCENT: 34 %
MDC IDC STAT BRADY RA PERCENT PACED: 66 %

## 2017-07-29 ENCOUNTER — Ambulatory Visit (INDEPENDENT_AMBULATORY_CARE_PROVIDER_SITE_OTHER): Payer: Medicare Other | Admitting: *Deleted

## 2017-07-29 ENCOUNTER — Encounter: Payer: Medicare Other | Admitting: Internal Medicine

## 2017-07-29 DIAGNOSIS — I441 Atrioventricular block, second degree: Secondary | ICD-10-CM

## 2017-07-29 NOTE — Progress Notes (Signed)
Remote pacemaker transmission.   

## 2017-07-31 LAB — CUP PACEART REMOTE DEVICE CHECK
Battery Voltage: 2.98 V
Brady Statistic AP VP Percent: 67 %
Brady Statistic AS VP Percent: 33 %
Brady Statistic RA Percent Paced: 67 %
Brady Statistic RV Percent Paced: 99 %
Implantable Lead Implant Date: 20150420
Implantable Lead Location: 753860
Implantable Pulse Generator Implant Date: 20150420
Lead Channel Impedance Value: 480 Ohm
Lead Channel Pacing Threshold Amplitude: 0.5 V
Lead Channel Pacing Threshold Pulse Width: 0.4 ms
Lead Channel Sensing Intrinsic Amplitude: 8.2 mV
Lead Channel Setting Pacing Amplitude: 2 V
Lead Channel Setting Pacing Amplitude: 2.5 V
Lead Channel Setting Sensing Sensitivity: 2 mV
MDC IDC LEAD IMPLANT DT: 20150420
MDC IDC LEAD LOCATION: 753859
MDC IDC MSMT BATTERY REMAINING LONGEVITY: 108 mo
MDC IDC MSMT BATTERY REMAINING PERCENTAGE: 95.5 %
MDC IDC MSMT LEADCHNL RA IMPEDANCE VALUE: 400 Ohm
MDC IDC MSMT LEADCHNL RA SENSING INTR AMPL: 2.6 mV
MDC IDC MSMT LEADCHNL RV PACING THRESHOLD AMPLITUDE: 1 V
MDC IDC MSMT LEADCHNL RV PACING THRESHOLD PULSEWIDTH: 0.4 ms
MDC IDC PG SERIAL: 3013717
MDC IDC SESS DTM: 20181022110015
MDC IDC SET LEADCHNL RV PACING PULSEWIDTH: 0.4 ms
MDC IDC STAT BRADY AP VS PERCENT: 1 %
MDC IDC STAT BRADY AS VS PERCENT: 1 %

## 2017-08-02 ENCOUNTER — Encounter: Payer: Self-pay | Admitting: Cardiology

## 2017-08-02 ENCOUNTER — Encounter: Payer: Self-pay | Admitting: Internal Medicine

## 2017-08-02 ENCOUNTER — Ambulatory Visit (INDEPENDENT_AMBULATORY_CARE_PROVIDER_SITE_OTHER): Payer: Medicare Other | Admitting: Internal Medicine

## 2017-08-02 VITALS — BP 140/60 | HR 62 | Temp 97.3°F | Ht 71.5 in | Wt 188.0 lb

## 2017-08-02 DIAGNOSIS — Z7189 Other specified counseling: Secondary | ICD-10-CM

## 2017-08-02 DIAGNOSIS — Z0000 Encounter for general adult medical examination without abnormal findings: Secondary | ICD-10-CM

## 2017-08-02 DIAGNOSIS — I480 Paroxysmal atrial fibrillation: Secondary | ICD-10-CM | POA: Diagnosis not present

## 2017-08-02 DIAGNOSIS — M1000 Idiopathic gout, unspecified site: Secondary | ICD-10-CM | POA: Diagnosis not present

## 2017-08-02 DIAGNOSIS — N401 Enlarged prostate with lower urinary tract symptoms: Secondary | ICD-10-CM

## 2017-08-02 DIAGNOSIS — Z23 Encounter for immunization: Secondary | ICD-10-CM

## 2017-08-02 DIAGNOSIS — R3915 Urgency of urination: Secondary | ICD-10-CM

## 2017-08-02 LAB — COMPREHENSIVE METABOLIC PANEL
ALBUMIN: 4 g/dL (ref 3.5–5.2)
ALK PHOS: 45 U/L (ref 39–117)
ALT: 15 U/L (ref 0–53)
AST: 25 U/L (ref 0–37)
BUN: 20 mg/dL (ref 6–23)
CO2: 31 mEq/L (ref 19–32)
CREATININE: 0.98 mg/dL (ref 0.40–1.50)
Calcium: 9.4 mg/dL (ref 8.4–10.5)
Chloride: 103 mEq/L (ref 96–112)
GFR: 75.37 mL/min (ref >60.00)
GLUCOSE: 96 mg/dL (ref 70–99)
Potassium: 4 mEq/L (ref 3.5–5.1)
SODIUM: 141 meq/L (ref 135–145)
TOTAL PROTEIN: 6.6 g/dL (ref 6.0–8.3)
Total Bilirubin: 0.8 mg/dL (ref 0.2–1.2)

## 2017-08-02 LAB — CBC
HCT: 37.7 % — ABNORMAL LOW (ref 39.0–52.0)
HEMOGLOBIN: 12.1 g/dL — AB (ref 13.0–17.0)
MCHC: 32.1 g/dL (ref 30.0–36.0)
MCV: 101 fl — AB (ref 78.0–100.0)
Platelets: 182 10*3/uL (ref 150.0–400.0)
RBC: 3.73 Mil/uL — ABNORMAL LOW (ref 4.22–5.81)
RDW: 16.3 % — AB (ref 11.5–15.5)
WBC: 7.1 10*3/uL (ref 4.0–10.5)

## 2017-08-02 LAB — T4, FREE: FREE T4: 1.07 ng/dL (ref 0.60–1.60)

## 2017-08-02 MED ORDER — TETANUS-DIPHTHERIA TOXOIDS TD 5-2 LFU IM INJ: 0.5000 mL | INJECTION | Freq: Once | INTRAMUSCULAR | 0 refills | Status: AC

## 2017-08-02 NOTE — Assessment & Plan Note (Signed)
See social history Urged him to discuss his wishes with family

## 2017-08-02 NOTE — Progress Notes (Signed)
Subjective:    Patient ID: Matthew Howard, male    DOB: Oct 08, 1919, 83 y.o.   MRN: [id number]  HPI Here for Medicare wellness visit and follow up of chronic health conditions Reviewed form and advanced directives Reviewed other doctors No alcohol or tobacco Stays physically active with yard work, Catering manager Vision is okay Has hearing aides Slipped yesterday on porch --on papers while painting. No injury.  No depression or anhedonia Independent with instrumental ADLs---drives, shops, etc Ongoing mild memory problems--no functional changes. Keeps notes, etc  Keeps up with cardiologist Regular remote pacer checks No palpitations Does get some dizziness at times--trying to take it slower. Balance is not good Discussed using a cane No edema  Voids okay on dual therapy Keeps up with the urologist Nocturia stable x 2 Stream is slow--does get urgency (and has to go quickly or he will be incontinent)  No flares of gout  Current Outpatient Prescriptions on File Prior to Visit  Medication Sig Dispense Refill  . aspirin 81 MG EC tablet Take 81 mg by mouth daily.     . finasteride (PROSCAR) 5 MG tablet TAKE 1 TABLET BY MOUTH DAILY 90 tablet 3  . Tamsulosin HCl (FLOMAX) 0.4 MG CAPS Take 1 capsule (0.4 mg total) by mouth daily. 90 capsule 1   No current facility-administered medications on file prior to visit.     No Known Allergies  Past Medical History:  Diagnosis Date  . Atrial flutter (HCC)    not well documented  . Benign prostatic hypertrophy   . Gout   . Hyperlipidemia   . Symptomatic bradycardia    s/p dual chamber PPM implantation April 2015 [facility] [employer])    Past Surgical History:  Procedure Laterality Date  . CATARACT EXTRACTION    . PACEMAKER INSERTION  01-25-2014   STJ Assurity dual chamber pacemaker implanted by Dr [name] for symptomatic bradycardia  . PERMANENT PACEMAKER INSERTION N/A 01/25/2014   Procedure: PERMANENT PACEMAKER INSERTION;  Surgeon: Marinus Maw, MD;  Location: [facility] CATH LAB;  Service: Cardiovascular;  Laterality: N/A;  . TONSILLECTOMY      Family History  Problem Relation Age of Onset  . Cancer Mother   . Throat cancer Father     Social History   Social History  . Marital status: Widowed    Spouse name: Terrence Wishon  . Number of children: 4  . Years of education: N/A   Occupational History  . retired     [employer]   Social History Main Topics  . Smoking status: Never Smoker  . Smokeless tobacco: Never Used  . Alcohol use Yes     Comment: rare etoh use  . Drug use: No  . Sexual activity: Not on file   Other Topics Concern  . Not on file   Social History Narrative   Widowed 4/15   No living will   No health care power of attorney--would want daughter [name] though   Would accept resuscitation but wouldn't want prolonged artificial ventilation   No tube feeds if cognitively unaware    Review of Systems Did get over the pneumonia--- cough meds helped Appetite is good Weight stable Sleeps okay-- nocturia disturbs but he gets back to sleep Wears seat belt Teeth okay--keeps up with dentist Bowels are okay. No blood No sig back or joint pain No skin problems No heartburn or dysphagia    Objective:   Physical Exam  Constitutional: He is oriented to person, place, and  time. He appears well-developed. No distress.  HENT:  Mouth/Throat: Oropharynx is clear and moist. No oropharyngeal exudate.  Neck: No thyromegaly present.  Cardiovascular: Normal rate and regular rhythm.  Exam reveals no gallop.   Faint pedal pulses Gr 2/6 blowing mitral systolic murmur  Pulmonary/Chest: Effort normal and breath sounds normal. No respiratory distress. He has no wheezes. He has no rales.  Abdominal: Soft. He exhibits no distension and no mass. There is no tenderness. There is no rebound and no guarding.  Musculoskeletal:  Trace ankle edema  Lymphadenopathy:    He has no cervical adenopathy.  Neurological: He is  alert and oriented to person, place, and time.  President-- "Trump, Obama, ?" [phone] Recall 3/3  Skin: No rash noted. No erythema.  Psychiatric: He has a normal mood and affect. His behavior is normal.          Assessment & Plan:

## 2017-08-02 NOTE — Patient Instructions (Signed)
Please get your tetanus booster at the pharmacy. 

## 2017-08-02 NOTE — Progress Notes (Signed)
Visual Acuity Screening   Right eye Left eye Both eyes  Without correction: 20/50 20/50 20/40   With correction:     Hearing Screening Comments: Wears Hearing Aids

## 2017-08-02 NOTE — Assessment & Plan Note (Signed)
Paced Only willing to take ASA No symptoms

## 2017-08-02 NOTE — Assessment & Plan Note (Signed)
No recent problems  °

## 2017-08-02 NOTE — Assessment & Plan Note (Signed)
Voids okay on dual therapy 

## 2017-08-02 NOTE — Assessment & Plan Note (Signed)
Healthy Discussed safety--- use cane/walking stick on uneven ground Update pneumovax and flu vaccines Rx for Td No cancer screening due to age

## 2017-10-28 ENCOUNTER — Ambulatory Visit (INDEPENDENT_AMBULATORY_CARE_PROVIDER_SITE_OTHER): Payer: Medicare Other | Admitting: *Deleted

## 2017-10-28 DIAGNOSIS — I441 Atrioventricular block, second degree: Secondary | ICD-10-CM | POA: Diagnosis not present

## 2017-10-28 NOTE — Progress Notes (Signed)
Remote pacemaker transmission.   

## 2017-10-29 ENCOUNTER — Encounter: Payer: Self-pay | Admitting: Cardiology

## 2017-11-23 LAB — CUP PACEART REMOTE DEVICE CHECK
Battery Remaining Longevity: 109 mo
Brady Statistic AP VP Percent: 68 %
Brady Statistic AP VS Percent: 1 %
Brady Statistic AS VP Percent: 32 %
Brady Statistic RA Percent Paced: 68 %
Date Time Interrogation Session: 20190121070012
Implantable Lead Implant Date: 20150420
Implantable Lead Location: 753859
Lead Channel Impedance Value: 440 Ohm
Lead Channel Impedance Value: 460 Ohm
Lead Channel Pacing Threshold Amplitude: 1 V
Lead Channel Sensing Intrinsic Amplitude: 9 mV
Lead Channel Setting Pacing Amplitude: 2 V
Lead Channel Setting Pacing Pulse Width: 0.4 ms
MDC IDC LEAD IMPLANT DT: 20150420
MDC IDC LEAD LOCATION: 753860
MDC IDC MSMT BATTERY REMAINING PERCENTAGE: 95.5 %
MDC IDC MSMT BATTERY VOLTAGE: 2.98 V
MDC IDC MSMT LEADCHNL RA PACING THRESHOLD AMPLITUDE: 0.5 V
MDC IDC MSMT LEADCHNL RA PACING THRESHOLD PULSEWIDTH: 0.4 ms
MDC IDC MSMT LEADCHNL RA SENSING INTR AMPL: 4.7 mV
MDC IDC MSMT LEADCHNL RV PACING THRESHOLD PULSEWIDTH: 0.4 ms
MDC IDC PG IMPLANT DT: 20150420
MDC IDC SET LEADCHNL RV PACING AMPLITUDE: 2.5 V
MDC IDC SET LEADCHNL RV SENSING SENSITIVITY: 2 mV
MDC IDC STAT BRADY AS VS PERCENT: 1 %
MDC IDC STAT BRADY RV PERCENT PACED: 99 %
Pulse Gen Serial Number: 3013717

## 2018-01-22 ENCOUNTER — Ambulatory Visit (INDEPENDENT_AMBULATORY_CARE_PROVIDER_SITE_OTHER): Payer: Medicare Other | Admitting: Internal Medicine

## 2018-01-22 ENCOUNTER — Encounter: Payer: Self-pay | Admitting: Internal Medicine

## 2018-01-22 VITALS — BP 150/58 | HR 74 | Ht 71.0 in | Wt 183.0 lb

## 2018-01-22 DIAGNOSIS — Z950 Presence of cardiac pacemaker: Secondary | ICD-10-CM

## 2018-01-22 DIAGNOSIS — I441 Atrioventricular block, second degree: Secondary | ICD-10-CM

## 2018-01-22 DIAGNOSIS — I480 Paroxysmal atrial fibrillation: Secondary | ICD-10-CM

## 2018-01-22 LAB — CUP PACEART INCLINIC DEVICE CHECK
Brady Statistic RA Percent Paced: 68 %
Implantable Lead Location: 753859
Implantable Lead Location: 753860
Implantable Pulse Generator Implant Date: 20150420
Lead Channel Impedance Value: 412.5 Ohm
Lead Channel Pacing Threshold Amplitude: 0.5 V
Lead Channel Pacing Threshold Amplitude: 0.75 V
Lead Channel Pacing Threshold Amplitude: 0.75 V
Lead Channel Pacing Threshold Pulse Width: 0.4 ms
Lead Channel Pacing Threshold Pulse Width: 0.4 ms
Lead Channel Pacing Threshold Pulse Width: 0.4 ms
Lead Channel Sensing Intrinsic Amplitude: 1.6 mV
Lead Channel Sensing Intrinsic Amplitude: 7.2 mV
Lead Channel Setting Pacing Amplitude: 2.5 V
Lead Channel Setting Pacing Pulse Width: 0.4 ms
MDC IDC LEAD IMPLANT DT: 20150420
MDC IDC LEAD IMPLANT DT: 20150420
MDC IDC MSMT BATTERY REMAINING LONGEVITY: 105 mo
MDC IDC MSMT BATTERY VOLTAGE: 2.98 V
MDC IDC MSMT LEADCHNL RA PACING THRESHOLD AMPLITUDE: 0.5 V
MDC IDC MSMT LEADCHNL RA PACING THRESHOLD PULSEWIDTH: 0.4 ms
MDC IDC MSMT LEADCHNL RV IMPEDANCE VALUE: 462.5 Ohm
MDC IDC PG SERIAL: 3013717
MDC IDC SESS DTM: 20190417143523
MDC IDC SET LEADCHNL RA PACING AMPLITUDE: 2 V
MDC IDC SET LEADCHNL RV SENSING SENSITIVITY: 2 mV
MDC IDC STAT BRADY RV PERCENT PACED: 99.44 %

## 2018-01-22 NOTE — Progress Notes (Signed)
HPI [name] returns today for followup. He has a h/o CHB, s/p PPM insertion, dyspnea with exertion and PAF. He has noted some worsening sob since his last visit. He admits to dietary indiscretion with sodium. He has not had syncope and has minimal peripheral edema.  No Known Allergies   Current Outpatient Medications  Medication Sig Dispense Refill  . aspirin 81 MG EC tablet Take 81 mg by mouth daily.     . finasteride (PROSCAR) 5 MG tablet TAKE 1 TABLET BY MOUTH DAILY 90 tablet 3  . Tamsulosin HCl (FLOMAX) 0.4 MG CAPS Take 1 capsule (0.4 mg total) by mouth daily. 90 capsule 1   No current facility-administered medications for this visit.      Past Medical History:  Diagnosis Date  . Atrial flutter (HCC)    not well documented  . Benign prostatic hypertrophy   . Gout   . Hyperlipidemia   . Symptomatic bradycardia    s/p dual chamber PPM implantation April 2015 [facility]    ROS:   All systems reviewed and negative except as noted in the HPI.   Past Surgical History:  Procedure Laterality Date  . CATARACT EXTRACTION    . PACEMAKER INSERTION  01-25-2014   STJ Assurity dual chamber pacemaker implanted by Dr Ladona Ridgelaylor for symptomatic bradycardia  . PERMANENT PACEMAKER INSERTION N/A 01/25/2014   Procedure: PERMANENT PACEMAKER INSERTION;  Surgeon: Marinus MawGregg W Taylor, MD;  Location: [facility] CATH LAB;  Service: Cardiovascular;  Laterality: N/A;  . TONSILLECTOMY       Family History  Problem Relation Age of Onset  . Cancer Mother   . Throat cancer Father      Social History   Socioeconomic History  . Marital status: Widowed    Spouse name: Matthew Howard  . Number of children: 4  . Years of education: Not on file  . Highest education level: Not on file  Occupational History  . Occupation: retired    Comment: [employer]  Social Needs  . Financial resource strain: Not on file  . Food insecurity:    Worry: Not on file    Inability: Not on file  .  Transportation needs:    Medical: Not on file    Non-medical: Not on file  Tobacco Use  . Smoking status: Never Smoker  . Smokeless tobacco: Never Used  Substance and Sexual Activity  . Alcohol use: Yes    Comment: rare etoh use  . Drug use: No  . Sexual activity: Not on file  Lifestyle  . Physical activity:    Days per week: Not on file    Minutes per session: Not on file  . Stress: Not on file  Relationships  . Social connections:    Talks on phone: Not on file    Gets together: Not on file    Attends religious service: Not on file    Active member of club or organization: Not on file    Attends meetings of clubs or organizations: Not on file    Relationship status: Not on file  . Intimate partner violence:    Fear of current or ex partner: Not on file    Emotionally abused: Not on file    Physically abused: Not on file    Forced sexual activity: Not on file  Other Topics Concern  . Not on file  Social History Narrative   Widowed 4/15   No living will   No health  care power of attorney--would want daughter [name] though   Would accept resuscitation but wouldn't want prolonged artificial ventilation   No tube feeds if cognitively unaware     BP (!) 150/58   Pulse 74   Ht 5\' 11"  (1.803 m)   Wt 183 lb (83 kg)   BMI 25.52 kg/m   Physical Exam:  Well appearing NAD HEENT: Unremarkable Neck:  No JVD, no thyromegally Lymphatics:  No adenopathy Back:  No CVA tenderness Lungs:  Clear HEART:  Regular rate rhythm, no murmurs, no rubs, no clicks Abd:  soft, positive bowel sounds, no organomegally, no rebound, no guarding Ext:  2 plus pulses, no edema, no cyanosis, no clubbing Skin:  No rashes no nodules Neuro:  CN II through XII intact, motor grossly intact  EKG  DEVICE  Normal device function.  See PaceArt for details.   Assess/Plan: 1. Chronic diastolic heart failure -  His symptoms are class 2. He is encouraged to reduce his salt intake. He admits to sodium  indiscretion.  2. CHB - his ppm is working normally. He will followup in several months. 3. HTN - he may need some diuretic therapy but we will try and curtail his salt intake.  Matthew Reeves.D.

## 2018-01-22 NOTE — Patient Instructions (Addendum)
Medication Instructions:  Your physician recommends that you continue on your current medications as directed. Please refer to the Current Medication list given to you today.  Labwork: None ordered.  Testing/Procedures: None ordered.  Follow-Up: Your physician wants you to follow-up in: one year with Dr. Ladona Ridgelaylor.   You will receive a reminder letter in the mail two months in advance. If you don't receive a letter, please call our office to schedule the follow-up appointment.  Remote monitoring is used to monitor your Pacemaker from home. This monitoring reduces the number of office visits required to check your device to one time per year. It allows us to keep an eye on the functioning of your device to ensure it is working properly. You are scheduled for a device check from home on 01/27/2018. You may send your transmission at any time that day. If you have a wireless device, the transmission will be sent automatically. After your physician reviews your transmission, you will receive a postcard with your next transmission date.  Any Other Special Instructions Will Be Listed Below (If Applicable).  Reduce your salt intake  If you need a refill on your cardiac medications before your next appointment, please call your pharmacy.    DASH Eating Plan DASH stands for "Dietary Approaches to Stop Hypertension." The DASH eating plan is a healthy eating plan that has been shown to reduce high blood pressure (hypertension). It may also reduce your risk for type 2 diabetes, heart disease, and stroke. The DASH eating plan may also help with weight loss. What are tips for following this plan? General guidelines  Avoid eating more than 2,300 mg (milligrams) of salt (sodium) a day. If you have hypertension, you may need to reduce your sodium intake to 1,500 mg a day.  Limit alcohol intake to no more than 1 drink a day for nonpregnant women and 2 drinks a day for men. One drink equals 12 oz of beer, 5 oz  of wine, or 1 oz of hard liquor.  Work with your health care provider to maintain a healthy body weight or to lose weight. Ask what an ideal weight is for you.  Get at least 30 minutes of exercise that causes your heart to beat faster (aerobic exercise) most days of the week. Activities may include walking, swimming, or biking.  Work with your health care provider or diet and nutrition specialist (dietitian) to adjust your eating plan to your individual calorie needs. Reading food labels  Check food labels for the amount of sodium per serving. Choose foods with less than 5 percent of the Daily Value of sodium. Generally, foods with less than 300 mg of sodium per serving fit into this eating plan.  To find whole grains, look for the word "whole" as the first word in the ingredient list. Shopping  Buy products labeled as "low-sodium" or "no salt added."  Buy fresh foods. Avoid canned foods and premade or frozen meals. Cooking  Avoid adding salt when cooking. Use salt-free seasonings or herbs instead of table salt or sea salt. Check with your health care provider or pharmacist before using salt substitutes.  Do not fry foods. Cook foods using healthy methods such as baking, boiling, grilling, and broiling instead.  Cook with heart-healthy oils, such as olive, canola, soybean, or sunflower oil. Meal planning   Eat a balanced diet that includes: ? 5 or more servings of fruits and vegetables each day. At each meal, try to fill half of your plate with  fruits and vegetables. ? Up to 6-8 servings of whole grains each day. ? Less than 6 oz of lean meat, poultry, or fish each day. A 3-oz serving of meat is about the same size as a deck of cards. One egg equals 1 oz. ? 2 servings of low-fat dairy each day. ? A serving of nuts, seeds, or beans 5 times each week. ? Heart-healthy fats. Healthy fats called Omega-3 fatty acids are found in foods such as flaxseeds and coldwater fish, like sardines,  salmon, and mackerel.  Limit how much you eat of the following: ? Canned or prepackaged foods. ? Food that is high in trans fat, such as fried foods. ? Food that is high in saturated fat, such as fatty meat. ? Sweets, desserts, sugary drinks, and other foods with added sugar. ? Full-fat dairy products.  Do not salt foods before eating.  Try to eat at least 2 vegetarian meals each week.  Eat more home-cooked food and less restaurant, buffet, and fast food.  When eating at a restaurant, ask that your food be prepared with less salt or no salt, if possible. What foods are recommended? The items listed may not be a complete list. Talk with your dietitian about what dietary choices are best for you. Grains Whole-grain or whole-wheat bread. Whole-grain or whole-wheat pasta. Brown rice. [place]. Bulgur. Whole-grain and low-sodium cereals. Pita bread. Low-fat, low-sodium crackers. Whole-wheat flour tortillas. Vegetables Fresh or frozen vegetables (raw, steamed, roasted, or grilled). Low-sodium or reduced-sodium tomato and vegetable juice. Low-sodium or reduced-sodium tomato sauce and tomato paste. Low-sodium or reduced-sodium canned vegetables. Fruits All fresh, dried, or frozen fruit. Canned fruit in natural juice (without added sugar). Meat and other protein foods Skinless chicken or [place]. Ground chicken or [place]. Pork with fat trimmed off. Fish and seafood. Egg whites. Dried beans, peas, or lentils. Unsalted nuts, nut butters, and seeds. Unsalted canned beans. Lean cuts of beef with fat trimmed off. Low-sodium, lean deli meat. Dairy Low-fat (1%) or fat-free (skim) milk. Fat-free, low-fat, or reduced-fat cheeses. Nonfat, low-sodium ricotta or cottage cheese. Low-fat or nonfat yogurt. Low-fat, low-sodium cheese. Fats and oils Soft margarine without trans fats. Vegetable oil. Low-fat, reduced-fat, or light mayonnaise and salad dressings (reduced-sodium). Canola, safflower, olive,  soybean, and sunflower oils. Avocado. Seasoning and other foods Herbs. Spices. Seasoning mixes without salt. Unsalted popcorn and pretzels. Fat-free sweets. What foods are not recommended? The items listed may not be a complete list. Talk with your dietitian about what dietary choices are best for you. Grains Baked goods made with fat, such as croissants, muffins, or some breads. Dry pasta or rice meal packs. Vegetables Creamed or fried vegetables. Vegetables in a cheese sauce. Regular canned vegetables (not low-sodium or reduced-sodium). Regular canned tomato sauce and paste (not low-sodium or reduced-sodium). Regular tomato and vegetable juice (not low-sodium or reduced-sodium). Angie Fava. Olives. Fruits Canned fruit in a light or heavy syrup. Fried fruit. Fruit in cream or butter sauce. Meat and other protein foods Fatty cuts of meat. Ribs. Fried meat. Berniece Salines. Sausage. Bologna and other processed lunch meats. Salami. Fatback. Hotdogs. Bratwurst. Salted nuts and seeds. Canned beans with added salt. Canned or smoked fish. Whole eggs or egg yolks. Chicken or [place] with skin. Dairy Whole or 2% milk, cream, and half-and-half. Whole or full-fat cream cheese. Whole-fat or sweetened yogurt. Full-fat cheese. Nondairy creamers. Whipped toppings. Processed cheese and cheese spreads. Fats and oils Butter. Stick margarine. Lard. Shortening. Ghee. Bacon fat. Tropical oils, such as coconut, palm  kernel, or palm oil. Seasoning and other foods Salted popcorn and pretzels. Onion salt, garlic salt, seasoned salt, table salt, and sea salt. Worcestershire sauce. Tartar sauce. Barbecue sauce. Teriyaki sauce. Soy sauce, including reduced-sodium. Steak sauce. Canned and packaged gravies. Fish sauce. Oyster sauce. Cocktail sauce. Horseradish that you find on the shelf. Ketchup. Mustard. Meat flavorings and tenderizers. Bouillon cubes. Hot sauce and Tabasco sauce. Premade or packaged marinades. Premade or packaged taco  seasonings. Relishes. Regular salad dressings. Where to find more information:  National Heart, Lung, and [place]: https://wilson-eaton.com/  American Heart Association: www.heart.org Summary  The DASH eating plan is a healthy eating plan that has been shown to reduce high blood pressure (hypertension). It may also reduce your risk for type 2 diabetes, heart disease, and stroke.  With the DASH eating plan, you should limit salt (sodium) intake to 2,300 mg a day. If you have hypertension, you may need to reduce your sodium intake to 1,500 mg a day.  When on the DASH eating plan, aim to eat more fresh fruits and vegetables, whole grains, lean proteins, low-fat dairy, and heart-healthy fats.  Work with your health care provider or diet and nutrition specialist (dietitian) to adjust your eating plan to your individual calorie needs. This information is not intended to replace advice given to you by your health care provider. Make sure you discuss any questions you have with your health care provider. Document Released: 09/13/2011 Document Revised: 09/17/2016 Document Reviewed: 09/17/2016 Elsevier Interactive Patient Education  [employer].

## 2018-01-27 ENCOUNTER — Ambulatory Visit (INDEPENDENT_AMBULATORY_CARE_PROVIDER_SITE_OTHER): Payer: Medicare Other | Admitting: *Deleted

## 2018-01-27 DIAGNOSIS — I441 Atrioventricular block, second degree: Secondary | ICD-10-CM | POA: Diagnosis not present

## 2018-01-27 NOTE — Progress Notes (Signed)
Remote pacemaker transmission.   

## 2018-01-28 ENCOUNTER — Encounter: Payer: Self-pay | Admitting: Cardiology

## 2018-01-28 LAB — CUP PACEART REMOTE DEVICE CHECK
Date Time Interrogation Session: 20190423154114
Implantable Lead Implant Date: 20150420
MDC IDC LEAD IMPLANT DT: 20150420
MDC IDC LEAD LOCATION: 753859
MDC IDC LEAD LOCATION: 753860
MDC IDC PG IMPLANT DT: 20150420
MDC IDC PG SERIAL: 3013717

## 2018-04-28 ENCOUNTER — Ambulatory Visit (INDEPENDENT_AMBULATORY_CARE_PROVIDER_SITE_OTHER): Payer: Medicare Other | Admitting: *Deleted

## 2018-04-28 DIAGNOSIS — I441 Atrioventricular block, second degree: Secondary | ICD-10-CM

## 2018-04-28 NOTE — Progress Notes (Signed)
Remote pacemaker transmission.   

## 2018-04-29 ENCOUNTER — Encounter: Payer: Self-pay | Admitting: Cardiology

## 2018-05-25 LAB — CUP PACEART REMOTE DEVICE CHECK
Battery Remaining Percentage: 95.5 %
Brady Statistic AP VS Percent: 1 %
Brady Statistic AS VP Percent: 33 %
Brady Statistic AS VS Percent: 1 %
Brady Statistic RV Percent Paced: 99 %
Implantable Lead Implant Date: 20150420
Implantable Lead Implant Date: 20150420
Implantable Lead Location: 753859
Lead Channel Impedance Value: 410 Ohm
Lead Channel Pacing Threshold Amplitude: 0.75 V
Lead Channel Pacing Threshold Pulse Width: 0.4 ms
Lead Channel Sensing Intrinsic Amplitude: 1.9 mV
Lead Channel Sensing Intrinsic Amplitude: 6.4 mV
Lead Channel Setting Pacing Amplitude: 2 V
Lead Channel Setting Pacing Amplitude: 2.5 V
Lead Channel Setting Pacing Pulse Width: 0.4 ms
Lead Channel Setting Sensing Sensitivity: 2 mV
MDC IDC LEAD LOCATION: 753860
MDC IDC MSMT BATTERY REMAINING LONGEVITY: 107 mo
MDC IDC MSMT BATTERY VOLTAGE: 2.98 V
MDC IDC MSMT LEADCHNL RA PACING THRESHOLD AMPLITUDE: 0.5 V
MDC IDC MSMT LEADCHNL RA PACING THRESHOLD PULSEWIDTH: 0.4 ms
MDC IDC MSMT LEADCHNL RV IMPEDANCE VALUE: 440 Ohm
MDC IDC PG IMPLANT DT: 20150420
MDC IDC PG SERIAL: 3013717
MDC IDC SESS DTM: 20190722090017
MDC IDC STAT BRADY AP VP PERCENT: 67 %
MDC IDC STAT BRADY RA PERCENT PACED: 67 %

## 2018-07-28 ENCOUNTER — Ambulatory Visit (INDEPENDENT_AMBULATORY_CARE_PROVIDER_SITE_OTHER): Payer: Medicare Other | Admitting: *Deleted

## 2018-07-28 DIAGNOSIS — I441 Atrioventricular block, second degree: Secondary | ICD-10-CM

## 2018-07-28 NOTE — Progress Notes (Signed)
Remote pacemaker transmission.   

## 2018-08-04 ENCOUNTER — Encounter: Payer: Medicare Other | Admitting: Internal Medicine

## 2018-08-11 ENCOUNTER — Ambulatory Visit (INDEPENDENT_AMBULATORY_CARE_PROVIDER_SITE_OTHER): Payer: Medicare Other | Admitting: Internal Medicine

## 2018-08-11 ENCOUNTER — Encounter: Payer: Self-pay | Admitting: Internal Medicine

## 2018-08-11 VITALS — BP 122/66 | HR 70 | Temp 97.6°F | Ht 70.5 in | Wt 191.0 lb

## 2018-08-11 DIAGNOSIS — Z0000 Encounter for general adult medical examination without abnormal findings: Secondary | ICD-10-CM | POA: Diagnosis not present

## 2018-08-11 DIAGNOSIS — R200 Anesthesia of skin: Secondary | ICD-10-CM

## 2018-08-11 DIAGNOSIS — N401 Enlarged prostate with lower urinary tract symptoms: Secondary | ICD-10-CM

## 2018-08-11 DIAGNOSIS — I441 Atrioventricular block, second degree: Secondary | ICD-10-CM | POA: Diagnosis not present

## 2018-08-11 DIAGNOSIS — R3915 Urgency of urination: Secondary | ICD-10-CM

## 2018-08-11 DIAGNOSIS — I480 Paroxysmal atrial fibrillation: Secondary | ICD-10-CM | POA: Diagnosis not present

## 2018-08-11 DIAGNOSIS — Z7189 Other specified counseling: Secondary | ICD-10-CM

## 2018-08-11 NOTE — Progress Notes (Addendum)
Subjective:    Patient ID: Matthew Howard, male    DOB: [birth year], 84 y.o.   MRN: [id number]  HPI Here for Medicare wellness visit and follow up of chronic health conditions Reviewed form and advanced directives Reviewed other doctors No alcohol or tobacco No set exercise but stays active with chores Vision is okay Has hearing aides Multiple falls--no sig injury Independent with instrumental ADLs Mild memory problems Still drives and does bills, etc  "I am getting more feeble" Worries about falling--and has fallen a few times Still does all his yard work--no ladders  About a month ago--had right ankle pain Used topical (icy hot) and it helped Now having soreness in left ankle at night Icy hot does help also Seems to be describing abnormal sensation  Does have some mild edema--not bad Still uses salt--but not much No palpitations No chest pain No OB No dizziness or syncope  Voids okay Very slow stream---but he feels he empties okay Nocturia x 2-3   No recent gout problems No sig back or joint pains  Current Outpatient Medications on File Prior to Visit  Medication Sig Dispense Refill  . aspirin 81 MG EC tablet Take 81 mg by mouth daily.     . finasteride (PROSCAR) 5 MG tablet TAKE 1 TABLET BY MOUTH DAILY 90 tablet 3  . Tamsulosin HCl (FLOMAX) 0.4 MG CAPS Take 1 capsule (0.4 mg total) by mouth daily. 90 capsule 1   No current facility-administered medications on file prior to visit.     No Known Allergies  Past Medical History:  Diagnosis Date  . Atrial flutter (HCC)    not well documented  . Benign prostatic hypertrophy   . Gout   . Hyperlipidemia   . Symptomatic bradycardia    s/p dual chamber PPM implantation April 2015 [facility])    Past Surgical History:  Procedure Laterality Date  . CATARACT EXTRACTION    . PACEMAKER INSERTION  01-25-2014   STJ Assurity dual chamber pacemaker implanted by Dr [name] for symptomatic bradycardia  .  PERMANENT PACEMAKER INSERTION N/A 01/25/2014   Procedure: PERMANENT PACEMAKER INSERTION;  Surgeon: Marinus Maw, MD;  Location: [facility] CATH LAB;  Service: Cardiovascular;  Laterality: N/A;  . TONSILLECTOMY      Family History  Problem Relation Age of Onset  . Cancer Mother   . Throat cancer Father     Social History   Socioeconomic History  . Marital status: Widowed    Spouse name: Ashden Sonnenberg  . Number of children: 4  . Years of education: Not on file  . Highest education level: Not on file  Occupational History  . Occupation: retired    Comment: Dispensing optician  Social Needs  . Financial resource strain: Not on file  . Food insecurity:    Worry: Not on file    Inability: Not on file  . Transportation needs:    Medical: Not on file    Non-medical: Not on file  Tobacco Use  . Smoking status: Never Smoker  . Smokeless tobacco: Never Used  Substance and Sexual Activity  . Alcohol use: Yes    Comment: rare etoh use  . Drug use: No  . Sexual activity: Not on file  Lifestyle  . Physical activity:    Days per week: Not on file    Minutes per session: Not on file  . Stress: Not on file  Relationships  . Social connections:    Talks on phone: Not  on file    Gets together: Not on file    Attends religious service: Not on file    Active member of club or organization: Not on file    Attends meetings of clubs or organizations: Not on file    Relationship status: Not on file  . Intimate partner violence:    Fear of current or ex partner: Not on file    Emotionally abused: Not on file    Physically abused: Not on file    Forced sexual activity: Not on file  Other Topics Concern  . Not on file  Social History Narrative   Widowed 4/15   No living will   No health care power of attorney--would want daughter [name] though   Would accept resuscitation but wouldn't want prolonged artificial ventilation   No tube feeds if cognitively unaware   Review of Systems Appetite is  good Weight about the same Mostly sleeps okay Teeth are okay---- keeps up with dentist No skin rash or lesions No heartburn or dysphagia    Objective:   Physical Exam  Constitutional: He is oriented to person, place, and time. He appears well-developed. No distress.  HENT:  Mouth/Throat: Oropharynx is clear and moist. No oropharyngeal exudate.  Neck: No thyromegaly present.  Cardiovascular: Normal rate and regular rhythm. Exam reveals no gallop.  1+ pulse right foot, faint on left Soft MR murmur  Respiratory: Effort normal and breath sounds normal. No respiratory distress. He has no wheezes. He has no rales.  GI: Soft. There is no tenderness.  Musculoskeletal:  1+ edema in ankles---left >right  Lymphadenopathy:    He has no cervical adenopathy.  Neurological: He is alert and oriented to person, place, and time.  President--- "Trump, ?" 100-93-? Won't spell backwards Recall 3/3  Skin: No rash noted. No erythema.  Psychiatric: He has a normal mood and affect. His behavior is normal.           Assessment & Plan:

## 2018-08-11 NOTE — Assessment & Plan Note (Signed)
Has pacer and it is checked regularly

## 2018-08-11 NOTE — Assessment & Plan Note (Signed)
See social history 

## 2018-08-11 NOTE — Progress Notes (Signed)
Visual Acuity Screening   Right eye Left eye Both eyes  Without correction: 20/40 20/40 20/30   With correction:     Hearing Screening Comments: Has hearing aids. Wearing them today.

## 2018-08-11 NOTE — Assessment & Plan Note (Signed)
No apparent recent spells Asa only

## 2018-08-11 NOTE — Assessment & Plan Note (Signed)
Feet symptoms sound like mild neuropathy Will just check labs He uses topical rx with relief of symptoms

## 2018-08-11 NOTE — Assessment & Plan Note (Signed)
I have personally reviewed the Medicare Annual Wellness questionnaire and have noted 1. The patient's medical and social history 2. Their use of alcohol, tobacco or illicit drugs 3. Their current medications and supplements 4. The patient's functional ability including ADL's, fall risks, home safety risks and hearing or visual             impairment. 5. Diet and physical activities 6. Evidence for depression or mood disorders  The patients weight, height, BMI and visual acuity have been recorded in the chart I have made referrals, counseling and provided education to the patient based review of the above and I have provided the pt with a written personalized care plan for preventive services.  I have provided you with a copy of your personalized plan for preventive services. Please take the time to review along with your updated medication list.  Had flu vaccine Not sure about shingrix No cancer screening due to age ?mild cognitive changes--not striking

## 2018-08-11 NOTE — Assessment & Plan Note (Signed)
Doing okay on dual therapy 

## 2018-08-12 LAB — CBC
HEMATOCRIT: 35.4 % — AB (ref 39.0–52.0)
HEMOGLOBIN: 11.6 g/dL — AB (ref 13.0–17.0)
MCHC: 32.8 g/dL (ref 30.0–36.0)
MCV: 100.7 fl — ABNORMAL HIGH (ref 78.0–100.0)
PLATELETS: 176 10*3/uL (ref 150.0–400.0)
RBC: 3.51 Mil/uL — ABNORMAL LOW (ref 4.22–5.81)
RDW: 16.5 % — ABNORMAL HIGH (ref 11.5–15.5)
WBC: 9.8 10*3/uL (ref 4.0–10.5)

## 2018-08-12 LAB — COMPREHENSIVE METABOLIC PANEL
ALT: 14 U/L (ref 0–53)
AST: 19 U/L (ref 0–37)
Albumin: 4.1 g/dL (ref 3.5–5.2)
Alkaline Phosphatase: 51 U/L (ref 39–117)
BILIRUBIN TOTAL: 0.6 mg/dL (ref 0.2–1.2)
BUN: 22 mg/dL (ref 6–23)
CALCIUM: 9.4 mg/dL (ref 8.4–10.5)
CHLORIDE: 104 meq/L (ref 96–112)
CO2: 27 meq/L (ref 19–32)
Creatinine, Ser: 1.27 mg/dL (ref 0.40–1.50)
GFR: 55.76 mL/min — ABNORMAL LOW (ref >60.00)
GLUCOSE: 79 mg/dL (ref 70–99)
Potassium: 4.3 mEq/L (ref 3.5–5.1)
SODIUM: 141 meq/L (ref 135–145)
Total Protein: 7.1 g/dL (ref 6.0–8.3)

## 2018-08-12 LAB — T4, FREE: Free T4: 0.89 ng/dL (ref 0.60–1.60)

## 2018-08-12 LAB — VITAMIN B12: VITAMIN B 12: 308 pg/mL (ref 211–911)

## 2018-08-13 ENCOUNTER — Telehealth: Payer: Self-pay

## 2018-08-13 NOTE — Telephone Encounter (Signed)
Per Lab Results: You have a ongoing mild anemia that is only slightly worse than last year. It may be worth checking your stool for blood if you are willing to have a colonoscopy if it is positive.  Tried to call the pt but he could not hear me on the phone.   Left a detailed message for his daughter, Shirlean Mylar and asked her to let us know if he wants to try a stool kit. We will mail it if he does.

## 2018-08-14 ENCOUNTER — Telehealth: Payer: Self-pay | Admitting: Internal Medicine

## 2018-08-14 NOTE — Telephone Encounter (Signed)
Added this note to previous telephone already created about this.

## 2018-08-14 NOTE — Telephone Encounter (Signed)
Robin(pt's daughter) cb stating that her dad does not want the stool kit. He does not feel he needs it.

## 2018-08-14 NOTE — Telephone Encounter (Signed)
Robin(pt's daughter) cb stating that her dad does not want the stool kit. He does not feel he needs it. 

## 2018-08-14 NOTE — Telephone Encounter (Signed)
Lab result sent to Dr Alphonsus Sias to let him know.

## 2018-09-25 ENCOUNTER — Telehealth: Payer: Self-pay

## 2018-09-25 NOTE — Telephone Encounter (Signed)
Per previous OV notes from Dr. Ladona Ridgelaylor, patient has declined OAC for PAF. Routed to Dr. Ladona Ridgelaylor for any new recommendations.

## 2018-09-25 NOTE — Telephone Encounter (Signed)
Spoke with pt today regarding his persistent AF since 06/2018. Pt states he is feeling ok and "it doesn't bother me much." When asked about starting an anticoagulant, the pt stated he didn't think he needed it at this time. I advised pt I would forward this to Dr Ladona Ridgelaylor for further review. If he had any recommendations we would call him back. Pt verbalized understanding.

## 2018-09-28 LAB — CUP PACEART REMOTE DEVICE CHECK
Battery Remaining Longevity: 107 mo
Battery Remaining Percentage: 95.5 %
Battery Voltage: 2.98 V
Brady Statistic AP VP Percent: 67 %
Brady Statistic AP VS Percent: 1 %
Brady Statistic AS VS Percent: 1 %
Brady Statistic RV Percent Paced: 99 %
Date Time Interrogation Session: 20191021174011
Implantable Lead Implant Date: 20150420
Implantable Lead Location: 753859
Implantable Pulse Generator Implant Date: 20150420
Lead Channel Impedance Value: 400 Ohm
Lead Channel Pacing Threshold Amplitude: 0.5 V
Lead Channel Pacing Threshold Amplitude: 0.75 V
Lead Channel Pacing Threshold Pulse Width: 0.4 ms
Lead Channel Sensing Intrinsic Amplitude: 1.8 mV
Lead Channel Setting Pacing Amplitude: 2.5 V
Lead Channel Setting Pacing Pulse Width: 0.4 ms
MDC IDC LEAD IMPLANT DT: 20150420
MDC IDC LEAD LOCATION: 753860
MDC IDC MSMT LEADCHNL RA PACING THRESHOLD PULSEWIDTH: 0.4 ms
MDC IDC MSMT LEADCHNL RV IMPEDANCE VALUE: 450 Ohm
MDC IDC MSMT LEADCHNL RV SENSING INTR AMPL: 5.9 mV
MDC IDC PG SERIAL: 3013717
MDC IDC SET LEADCHNL RA PACING AMPLITUDE: 2 V
MDC IDC SET LEADCHNL RV SENSING SENSITIVITY: 2 mV
MDC IDC STAT BRADY AS VP PERCENT: 33 %
MDC IDC STAT BRADY RA PERCENT PACED: 61 %

## 2018-10-07 NOTE — Telephone Encounter (Signed)
Per Dr. Ladona Ridgelaylor- No new recommendations.

## 2018-10-27 ENCOUNTER — Ambulatory Visit (INDEPENDENT_AMBULATORY_CARE_PROVIDER_SITE_OTHER): Payer: Medicare Other

## 2018-10-27 DIAGNOSIS — I441 Atrioventricular block, second degree: Secondary | ICD-10-CM | POA: Diagnosis not present

## 2018-10-28 LAB — CUP PACEART REMOTE DEVICE CHECK
Brady Statistic AP VP Percent: 67 %
Brady Statistic AP VS Percent: 1 %
Brady Statistic AS VP Percent: 33 %
Brady Statistic AS VS Percent: 1 %
Implantable Lead Implant Date: 20150420
Lead Channel Impedance Value: 440 Ohm
Lead Channel Pacing Threshold Amplitude: 0.75 V
Lead Channel Pacing Threshold Pulse Width: 0.4 ms
Lead Channel Sensing Intrinsic Amplitude: 6.6 mV
Lead Channel Setting Pacing Amplitude: 2 V
Lead Channel Setting Pacing Amplitude: 2.5 V
MDC IDC LEAD IMPLANT DT: 20150420
MDC IDC LEAD LOCATION: 753859
MDC IDC LEAD LOCATION: 753860
MDC IDC MSMT BATTERY REMAINING LONGEVITY: 100 mo
MDC IDC MSMT BATTERY REMAINING PERCENTAGE: 95.5 %
MDC IDC MSMT BATTERY VOLTAGE: 2.96 V
MDC IDC MSMT LEADCHNL RA IMPEDANCE VALUE: 400 Ohm
MDC IDC MSMT LEADCHNL RA PACING THRESHOLD AMPLITUDE: 0.5 V
MDC IDC MSMT LEADCHNL RA PACING THRESHOLD PULSEWIDTH: 0.4 ms
MDC IDC MSMT LEADCHNL RA SENSING INTR AMPL: 1.8 mV
MDC IDC PG IMPLANT DT: 20150420
MDC IDC PG SERIAL: 3013717
MDC IDC SESS DTM: 20200120082138
MDC IDC SET LEADCHNL RV PACING PULSEWIDTH: 0.4 ms
MDC IDC SET LEADCHNL RV SENSING SENSITIVITY: 2 mV
MDC IDC STAT BRADY RA PERCENT PACED: 52 %
MDC IDC STAT BRADY RV PERCENT PACED: 99 %

## 2018-10-28 NOTE — Progress Notes (Signed)
Remote pacemaker transmission.   

## 2018-11-06 ENCOUNTER — Other Ambulatory Visit: Payer: Self-pay | Admitting: Internal Medicine

## 2018-11-26 ENCOUNTER — Encounter: Payer: Self-pay | Admitting: Cardiology

## 2018-12-16 ENCOUNTER — Ambulatory Visit (INDEPENDENT_AMBULATORY_CARE_PROVIDER_SITE_OTHER): Payer: Medicare Other | Admitting: Cardiology

## 2018-12-16 ENCOUNTER — Encounter: Payer: Self-pay | Admitting: Cardiology

## 2018-12-16 VITALS — BP 140/64 | HR 70 | Ht 70.5 in | Wt 186.0 lb

## 2018-12-16 DIAGNOSIS — Z950 Presence of cardiac pacemaker: Secondary | ICD-10-CM | POA: Diagnosis not present

## 2018-12-16 DIAGNOSIS — I441 Atrioventricular block, second degree: Secondary | ICD-10-CM

## 2018-12-16 DIAGNOSIS — I480 Paroxysmal atrial fibrillation: Secondary | ICD-10-CM | POA: Diagnosis not present

## 2018-12-16 NOTE — Progress Notes (Signed)
Cardiology Office Note:    Date:  12/16/2018   ID:  Matthew Howard, DOB [birth year], MRN [id number]  PCP:  Matthew Schwalbe, MD  Cardiologist:  Matthew Schultz, MD  Electrophysiologist:  None   Referring MD: Matthew Schwalbe, MD     History of Present Illness:    Matthew Howard is a 85 y.o. male here for follow-up of complete heart block pacemaker, Dr. Ladona Howard, dyspnea with exertion and paroxysmal atrial fibrillation.  Recently had a fall a little over a week ago when getting up in the middle of night to go to the bathroom.  He was reaching for his chair which is normally by his computer but it was not there and he mechanically fell over behind the desk.  He was able to get back up but has been sore for about a week, this has now resolved.  Feels good now.  Was somewhat stiff and sore previously.  Overall he gets minimal shortness of breath with activity which he attributes to his 85 year old age.  No chest pain no fever chills nausea vomiting syncope bleeding.  Past Medical History:  Diagnosis Date  . Atrial flutter (HCC)    not well documented  . Benign prostatic hypertrophy   . Gout   . Hyperlipidemia   . Symptomatic bradycardia    s/p dual chamber PPM implantation April 2015 [facility])    Past Surgical History:  Procedure Laterality Date  . CATARACT EXTRACTION    . PACEMAKER INSERTION  01-25-2014   STJ Assurity dual chamber pacemaker implanted by Dr Matthew Howard for symptomatic bradycardia  . PERMANENT PACEMAKER INSERTION N/A 01/25/2014   Procedure: PERMANENT PACEMAKER INSERTION;  Surgeon: Matthew Maw, MD;  Location: [facility] CATH LAB;  Service: Cardiovascular;  Laterality: N/A;  . TONSILLECTOMY      Current Medications: Current Meds  Medication Sig  . aspirin 81 MG EC tablet Take 81 mg by mouth daily.   . finasteride (PROSCAR) 5 MG tablet TAKE 1 TABLET BY MOUTH DAILY  . Tamsulosin HCl (FLOMAX) 0.4 MG CAPS Take 1 capsule (0.4 mg total) by mouth daily.     Allergies:    Patient has no known allergies.   Social History   Socioeconomic History  . Marital status: Widowed    Spouse name: Tian Douds  . Number of children: 4  . Years of education: Not on file  . Highest education level: Not on file  Occupational History  . Occupation: retired    Comment: Dispensing optician  Social Needs  . Financial resource strain: Not on file  . Food insecurity:    Worry: Not on file    Inability: Not on file  . Transportation needs:    Medical: Not on file    Non-medical: Not on file  Tobacco Use  . Smoking status: Never Smoker  . Smokeless tobacco: Never Used  Substance and Sexual Activity  . Alcohol use: Yes    Comment: rare etoh use  . Drug use: No  . Sexual activity: Not on file  Lifestyle  . Physical activity:    Days per week: Not on file    Minutes per session: Not on file  . Stress: Not on file  Relationships  . Social connections:    Talks on phone: Not on file    Gets together: Not on file    Attends religious service: Not on file    Active member of club or organization: Not on file  Attends meetings of clubs or organizations: Not on file    Relationship status: Not on file  Other Topics Concern  . Not on file  Social History Narrative   Widowed 4/15   No living will   No health care power of attorney--would want daughter Matthew Howard though   Would accept resuscitation but wouldn't want prolonged artificial ventilation   No tube feeds if cognitively unaware     Family History: The patient's family history includes Cancer in his mother; Throat cancer in his father.  ROS:   Please see the history of present illness.     All other systems reviewed and are negative.  EKGs/Labs/Other Studies Reviewed:    The following studies were reviewed today: Prior office note reviewed EKG reviewed lab work reviewed  EKG:  EKG is  ordered today.  The ekg ordered today demonstrates ventricular pacing 70 likely underlying sinus rhythm potential atrial  flutter underlying.  Recent Labs: 08/11/2018: ALT 14; BUN 22; Creatinine, Ser 1.27; Hemoglobin 11.6; Platelets 176.0; Potassium 4.3; Sodium 141  Recent Lipid Panel    Component Value Date/Time   CHOL 141 11/01/2008 1534   TRIG 104 11/01/2008 1534   HDL 43.4 11/01/2008 1534   CHOLHDL 3.2 CALC 11/01/2008 1534   VLDL 21 11/01/2008 1534   LDLCALC 77 11/01/2008 1534    Physical Exam:    VS:  BP 140/64   Pulse 70   Ht 5' 10.5" (1.791 m)   Wt 186 lb (84.4 kg)   BMI 26.31 kg/m     Wt Readings from Last 3 Encounters:  12/16/18 186 lb (84.4 kg)  08/11/18 191 lb (86.6 kg)  01/22/18 183 lb (83 kg)     GEN: Elderly Well nourished, well developed in no acute distress HEENT: Normal NECK: No JVD; No carotid bruits LYMPHATICS: No lymphadenopathy CARDIAC: RRR, no murmurs, rubs, gallops RESPIRATORY:  Clear to auscultation without rales, wheezing or rhonchi  ABDOMEN: Soft, non-tender, non-distended MUSCULOSKELETAL:  No edema; No deformity  SKIN: Warm and dry NEUROLOGIC:  Alert and oriented x 3 PSYCHIATRIC:  Normal affect   ASSESSMENT:    1. Paroxysmal atrial fibrillation (HCC)   2. Mobitz type II atrioventricular block   3. Pacemaker    PLAN:    In order of problems listed above:  Complete heart block - Pacemaker functioning normally, last visit with Dr. Ladona Howard 01/22/2018.  Essential hypertension - Continuing to monitor.  Borderline elevated.  Overall doing well.  Chronic diastolic heart failure - Some shortness of breath with activity.  Class II type symptoms.  Dr. Ladona Howard encouraged him to watch his salt intake previously. If he is a hurry, may feel shortness of breath. Minor ankle edema quite common for him.  Lets avoid diuretic unless absolutely needed.  Recent falls - Mechanical falls.  No syncope.  He is gotten a second night light for his bedroom and this is helped.  He knows to be careful.  Paroxysmal atrial fibrillation - Continuing to monitor via pacemaker.  Given  his advanced age, several mechanical falls, I do not think that he would be a good anticoagulation candidate.  He is at increased risk for embolic stroke.   Medication Adjustments/Labs and Tests Ordered: Current medicines are reviewed at length with the patient today.  Concerns regarding medicines are outlined above.  Orders Placed This Encounter  Procedures  . EKG 12-Lead   No orders of the defined types were placed in this encounter.   Patient Instructions  Medication Instructions:  The  current medical regimen is effective;  continue present plan and medications.  If you need a refill on your cardiac medications before your next appointment, please call your pharmacy.   Follow-Up: At [facility], you and your health needs are our priority.  As part of our continuing mission to provide you with exceptional heart care, we have created designated Provider Care Teams.  These Care Teams include your primary Cardiologist (physician) and Advanced Practice Providers (APPs -  Physician Assistants and Nurse Practitioners) who all work together to provide you with the care you need, when you need it. You will need a follow up appointment in 12 months.  Please call our office 2 months in advance to schedule this appointment.  You may see Matthew Schultz, MD or one of the following Advanced Practice Providers on your designated Care Team:   Norma Fredrickson, NP Nada Boozer, NP . Georgie Chard, NP  Thank you for choosing [facility]!!         Signed, Matthew Schultz, MD  12/16/2018 9:52 AM    [facility]

## 2018-12-16 NOTE — Patient Instructions (Signed)
Medication Instructions:  The current medical regimen is effective;  continue present plan and medications.  If you need a refill on your cardiac medications before your next appointment, please call your pharmacy.   Follow-Up: At [facility], you and your health needs are our priority.  As part of our continuing mission to provide you with exceptional heart care, we have created designated Provider Care Teams.  These Care Teams include your primary Cardiologist (physician) and Advanced Practice Providers (APPs -  Physician Assistants and Nurse Practitioners) who all work together to provide you with the care you need, when you need it. You will need a follow up appointment in 12 months.  Please call our office 2 months in advance to schedule this appointment.  You may see Mark Skains, MD or one of the following Advanced Practice Providers on your designated Care Team:   Lori Gerhardt, NP Laura Ingold, NP . Jill McDaniel, NP  Thank you for choosing [facility]!!      

## 2019-01-21 ENCOUNTER — Telehealth: Payer: Self-pay | Admitting: Internal Medicine

## 2019-01-21 NOTE — Telephone Encounter (Signed)
New message   Called patient about appt on 04.21.20 with Dr. [name]. Patient only has landline and would prefer to come into the office and see Dr. Linton Ham to August 21.

## 2019-01-26 ENCOUNTER — Ambulatory Visit (INDEPENDENT_AMBULATORY_CARE_PROVIDER_SITE_OTHER): Payer: Medicare Other | Admitting: *Deleted

## 2019-01-26 ENCOUNTER — Other Ambulatory Visit: Payer: Self-pay

## 2019-01-26 DIAGNOSIS — I441 Atrioventricular block, second degree: Secondary | ICD-10-CM | POA: Diagnosis not present

## 2019-01-27 ENCOUNTER — Encounter: Payer: Medicare Other | Admitting: Internal Medicine

## 2019-01-27 LAB — CUP PACEART REMOTE DEVICE CHECK
Date Time Interrogation Session: 20200421090803
Implantable Lead Implant Date: 20150420
Implantable Lead Implant Date: 20150420
Implantable Lead Location: 753859
Implantable Lead Location: 753860
Implantable Pulse Generator Implant Date: 20150420
Pulse Gen Model: 2240
Pulse Gen Serial Number: 3013717

## 2019-02-03 ENCOUNTER — Encounter: Payer: Self-pay | Admitting: Cardiology

## 2019-02-03 NOTE — Progress Notes (Signed)
Remote pacemaker transmission.   

## 2019-04-27 ENCOUNTER — Ambulatory Visit (INDEPENDENT_AMBULATORY_CARE_PROVIDER_SITE_OTHER): Payer: Medicare Other | Admitting: *Deleted

## 2019-04-27 DIAGNOSIS — I441 Atrioventricular block, second degree: Secondary | ICD-10-CM

## 2019-04-28 LAB — CUP PACEART REMOTE DEVICE CHECK
Date Time Interrogation Session: 20200721092311
Implantable Lead Implant Date: 20150420
Implantable Lead Implant Date: 20150420
Implantable Lead Location: 753859
Implantable Lead Location: 753860
Implantable Pulse Generator Implant Date: 20150420
Pulse Gen Model: 2240
Pulse Gen Serial Number: 3013717

## 2019-05-12 ENCOUNTER — Encounter: Payer: Self-pay | Admitting: Cardiology

## 2019-05-12 NOTE — Progress Notes (Signed)
Remote pacemaker transmission.   

## 2019-05-29 ENCOUNTER — Ambulatory Visit (INDEPENDENT_AMBULATORY_CARE_PROVIDER_SITE_OTHER): Payer: Medicare Other | Admitting: Internal Medicine

## 2019-05-29 ENCOUNTER — Other Ambulatory Visit: Payer: Self-pay

## 2019-05-29 ENCOUNTER — Encounter (INDEPENDENT_AMBULATORY_CARE_PROVIDER_SITE_OTHER): Payer: Self-pay

## 2019-05-29 ENCOUNTER — Encounter: Payer: Self-pay | Admitting: Internal Medicine

## 2019-05-29 VITALS — BP 138/62 | HR 102 | Ht 70.5 in | Wt 176.0 lb

## 2019-05-29 DIAGNOSIS — I480 Paroxysmal atrial fibrillation: Secondary | ICD-10-CM | POA: Diagnosis not present

## 2019-05-29 DIAGNOSIS — I441 Atrioventricular block, second degree: Secondary | ICD-10-CM

## 2019-05-29 DIAGNOSIS — Z950 Presence of cardiac pacemaker: Secondary | ICD-10-CM

## 2019-05-29 LAB — CUP PACEART INCLINIC DEVICE CHECK
Battery Remaining Longevity: 102 mo
Battery Voltage: 2.96 V
Brady Statistic RA Percent Paced: 40 %
Brady Statistic RV Percent Paced: 99 %
Date Time Interrogation Session: 20200821170553
Implantable Lead Implant Date: 20150420
Implantable Lead Implant Date: 20150420
Implantable Lead Location: 753859
Implantable Lead Location: 753860
Implantable Pulse Generator Implant Date: 20150420
Lead Channel Impedance Value: 450 Ohm
Lead Channel Impedance Value: 462.5 Ohm
Lead Channel Pacing Threshold Amplitude: 0.5 V
Lead Channel Pacing Threshold Amplitude: 0.75 V
Lead Channel Pacing Threshold Pulse Width: 0.4 ms
Lead Channel Pacing Threshold Pulse Width: 0.4 ms
Lead Channel Sensing Intrinsic Amplitude: 5 mV
Lead Channel Sensing Intrinsic Amplitude: 7.2 mV
Lead Channel Setting Pacing Amplitude: 2 V
Lead Channel Setting Pacing Amplitude: 2.5 V
Lead Channel Setting Pacing Pulse Width: 0.4 ms
Lead Channel Setting Sensing Sensitivity: 2 mV
Pulse Gen Model: 2240
Pulse Gen Serial Number: 3013717

## 2019-05-29 NOTE — Progress Notes (Signed)
HPI [name] returns today for followup. He has a h/o CHB and sinus node dysfunction, s/p PPM insertion, dyspnea with exertion and PAF. He admits to dietary indiscretion with sodium. He has not had syncope and has minimal peripheral edema.  No Known Allergies   Current Outpatient Medications  Medication Sig Dispense Refill  . aspirin 81 MG EC tablet Take 81 mg by mouth daily.     . finasteride (PROSCAR) 5 MG tablet TAKE 1 TABLET BY MOUTH DAILY 90 tablet 3  . Tamsulosin HCl (FLOMAX) 0.4 MG CAPS Take 1 capsule (0.4 mg total) by mouth daily. 90 capsule 1   No current facility-administered medications for this visit.      Past Medical History:  Diagnosis Date  . Atrial flutter ([place])    not well documented  . Benign prostatic hypertrophy   . Gout   . Hyperlipidemia   . Symptomatic bradycardia    s/p dual chamber PPM implantation April 2015 ([place])    ROS:   All systems reviewed and negative except as noted in the HPI.   Past Surgical History:  Procedure Laterality Date  . CATARACT EXTRACTION    . PACEMAKER INSERTION  01-25-2014   STJ Assurity dual chamber pacemaker implanted by Dr Lovena Le for symptomatic bradycardia  . PERMANENT PACEMAKER INSERTION N/A 01/25/2014   Procedure: PERMANENT PACEMAKER INSERTION;  Surgeon: Evans Lance, MD;  Location: [facility] CATH LAB;  Service: Cardiovascular;  Laterality: N/A;  . TONSILLECTOMY       Family History  Problem Relation Age of Onset  . Cancer Mother   . Throat cancer Father      Social History   Socioeconomic History  . Marital status: Widowed    Spouse name: Matthew Howard  . Number of children: 4  . Years of education: Not on file  . Highest education level: Not on file  Occupational History  . Occupation: retired    Comment: Therapist, nutritional  Social Needs  . Financial resource strain: Not on file  . Food insecurity    Worry: Not on file    Inability: Not on file  . Transportation needs    Medical: Not  on file    Non-medical: Not on file  Tobacco Use  . Smoking status: Never Smoker  . Smokeless tobacco: Never Used  Substance and Sexual Activity  . Alcohol use: Yes    Comment: rare etoh use  . Drug use: No  . Sexual activity: Not on file  Lifestyle  . Physical activity    Days per week: Not on file    Minutes per session: Not on file  . Stress: Not on file  Relationships  . Social Herbalist on phone: Not on file    Gets together: Not on file    Attends religious service: Not on file    Active member of club or organization: Not on file    Attends meetings of clubs or organizations: Not on file    Relationship status: Not on file  . Intimate partner violence    Fear of current or ex partner: Not on file    Emotionally abused: Not on file    Physically abused: Not on file    Forced sexual activity: Not on file  Other Topics Concern  . Not on file  Social History Narrative   Widowed 4/15   No living will   No health care power of attorney--would want daughter  Matthew Howard though   Would accept resuscitation but wouldn't want prolonged artificial ventilation   No tube feeds if cognitively unaware     BP 138/62   Pulse (!) 102   Ht 5' 10.5" (1.791 m)   Wt 176 lb (79.8 kg)   SpO2 (!) 76%   BMI 24.90 kg/m   Physical Exam:  Well appearing NAD HEENT: Unremarkable Neck:  No JVD, no thyromegally Lymphatics:  No adenopathy Back:  No CVA tenderness Lungs:  Clear with no wheezes HEART:  Regular rate rhythm, no murmurs, no rubs, no clicks Abd:  soft, positive bowel sounds, no organomegally, no rebound, no guarding Ext:  2 plus pulses, no edema, no cyanosis, no clubbing Skin:  No rashes no nodules Neuro:  CN II through XII intact, motor grossly intact  EKG - none  DEVICE  Normal device function.  See PaceArt for details.   Assess/Plan: 1. PAF - he has been mode switched about 40% of the time. He is minimally symptomatic. No change. 2. PPM - his [employer] DDD PM  is working normally.  3. Diastolic heart failure - his symptoms are class 2. He denies worsening. He will continue a low sodium diet.  Leonia ReevesGregg Theodis Kinsel,M.D.

## 2019-05-29 NOTE — Patient Instructions (Signed)
Your physician recommends that you continue on your current medications as directed. Please refer to the Current Medication list given to you today.   Your physician wants you to follow-up in: YEAR WITH DR TAYLOR  You will receive a reminder letter in the mail two months in advance. If you don't receive a letter, please call our office to schedule the follow-up appointment.  

## 2019-07-27 ENCOUNTER — Ambulatory Visit (INDEPENDENT_AMBULATORY_CARE_PROVIDER_SITE_OTHER): Payer: Medicare Other | Admitting: *Deleted

## 2019-07-27 DIAGNOSIS — I480 Paroxysmal atrial fibrillation: Secondary | ICD-10-CM

## 2019-07-27 DIAGNOSIS — I441 Atrioventricular block, second degree: Secondary | ICD-10-CM | POA: Diagnosis not present

## 2019-07-28 LAB — CUP PACEART REMOTE DEVICE CHECK
Battery Remaining Longevity: 96 mo
Battery Remaining Percentage: 89 %
Battery Voltage: 2.95 V
Brady Statistic AP VP Percent: 64 %
Brady Statistic AP VS Percent: 1 %
Brady Statistic AS VP Percent: 31 %
Brady Statistic AS VS Percent: 1.1 %
Brady Statistic RA Percent Paced: 23 %
Brady Statistic RV Percent Paced: 95 %
Date Time Interrogation Session: 20201019082407
Implantable Lead Implant Date: 20150420
Implantable Lead Implant Date: 20150420
Implantable Lead Location: 753859
Implantable Lead Location: 753860
Implantable Pulse Generator Implant Date: 20150420
Lead Channel Impedance Value: 390 Ohm
Lead Channel Impedance Value: 440 Ohm
Lead Channel Pacing Threshold Amplitude: 0.5 V
Lead Channel Pacing Threshold Amplitude: 0.75 V
Lead Channel Pacing Threshold Pulse Width: 0.4 ms
Lead Channel Pacing Threshold Pulse Width: 0.4 ms
Lead Channel Sensing Intrinsic Amplitude: 1.8 mV
Lead Channel Sensing Intrinsic Amplitude: 12 mV
Lead Channel Setting Pacing Amplitude: 2 V
Lead Channel Setting Pacing Amplitude: 2.5 V
Lead Channel Setting Pacing Pulse Width: 0.4 ms
Lead Channel Setting Sensing Sensitivity: 2 mV
Pulse Gen Model: 2240
Pulse Gen Serial Number: 3013717

## 2019-08-13 ENCOUNTER — Ambulatory Visit (INDEPENDENT_AMBULATORY_CARE_PROVIDER_SITE_OTHER): Payer: Medicare Other | Admitting: Internal Medicine

## 2019-08-13 ENCOUNTER — Other Ambulatory Visit: Payer: Self-pay

## 2019-08-13 ENCOUNTER — Encounter: Payer: Self-pay | Admitting: Internal Medicine

## 2019-08-13 VITALS — BP 104/60 | HR 58 | Temp 98.7°F | Ht 71.0 in | Wt 180.0 lb

## 2019-08-13 DIAGNOSIS — Z0000 Encounter for general adult medical examination without abnormal findings: Secondary | ICD-10-CM

## 2019-08-13 DIAGNOSIS — Z23 Encounter for immunization: Secondary | ICD-10-CM

## 2019-08-13 DIAGNOSIS — K4090 Unilateral inguinal hernia, without obstruction or gangrene, not specified as recurrent: Secondary | ICD-10-CM | POA: Insufficient documentation

## 2019-08-13 DIAGNOSIS — I480 Paroxysmal atrial fibrillation: Secondary | ICD-10-CM

## 2019-08-13 DIAGNOSIS — Z7189 Other specified counseling: Secondary | ICD-10-CM

## 2019-08-13 DIAGNOSIS — M1000 Idiopathic gout, unspecified site: Secondary | ICD-10-CM | POA: Diagnosis not present

## 2019-08-13 DIAGNOSIS — N401 Enlarged prostate with lower urinary tract symptoms: Secondary | ICD-10-CM

## 2019-08-13 DIAGNOSIS — R3915 Urgency of urination: Secondary | ICD-10-CM

## 2019-08-13 HISTORY — DX: Unilateral inguinal hernia, without obstruction or gangrene, not specified as recurrent: K40.90

## 2019-08-13 NOTE — Assessment & Plan Note (Signed)
Quiet now  

## 2019-08-13 NOTE — Assessment & Plan Note (Signed)
On dual therapy No apparent retention Still symptomatic

## 2019-08-13 NOTE — Progress Notes (Signed)
Subjective:    Patient ID: Matthew Howard, male    DOB: [birth year], 85 y.o.   MRN: [id number]  HPI Here for Medicare wellness visit and follow up of chronic medical problems Reviewed form and advanced directives Reviewed other doctors No alcohol or tobacco [place] farm---attends to safety  Vision is fair Hearing is poor---- not working now Has fallen several times---doesn't really remember. Slips only. No injury No depression or anhedonia. Frustrated that he can't do what he used to do He does his shopping and all instrumental ADLs. Not able to go to church since [place] Does have some recall issues--but no functional cognitive problems  Has right inguinal hernia  There for ?2 years or more---not really changing No pain  Gets soiled when he moves bowels but not uncomfortable, even when pushing  Has paced No palpitations  No chest pain or SOB No dizziness or syncope Right ankle swells at times at night---then may hurt later. Uses rub with success  Voids fair Still has frequency Flow is slow--he has to push at times No apparent obstruction Nocturia x 3-4  No gout flares  Current Outpatient Medications on File Prior to Visit  Medication Sig Dispense Refill  . aspirin 81 MG EC tablet Take 81 mg by mouth daily.     . finasteride (PROSCAR) 5 MG tablet TAKE 1 TABLET BY MOUTH DAILY 90 tablet 3  . Tamsulosin HCl (FLOMAX) 0.4 MG CAPS Take 1 capsule (0.4 mg total) by mouth daily. 90 capsule 1   No current facility-administered medications on file prior to visit.     No Known Allergies  Past Medical History:  Diagnosis Date  . Atrial flutter ([place])    not well documented  . Benign prostatic hypertrophy   . Gout   . Hyperlipidemia   . Symptomatic bradycardia    s/p dual chamber PPM implantation April 2015 ([place])    Past Surgical History:  Procedure Laterality Date  . CATARACT EXTRACTION    . PACEMAKER INSERTION  01-25-2014   STJ Assurity dual  chamber pacemaker implanted by Dr Lovena Le for symptomatic bradycardia  . PERMANENT PACEMAKER INSERTION N/A 01/25/2014   Procedure: PERMANENT PACEMAKER INSERTION;  Surgeon: Evans Lance, MD;  Location: [facility] CATH LAB;  Service: Cardiovascular;  Laterality: N/A;  . TONSILLECTOMY      Family History  Problem Relation Age of Onset  . Cancer Mother   . Throat cancer Father     Social History   Socioeconomic History  . Marital status: Widowed    Spouse name: [name]  . Number of children: 4  . Years of education: Not on file  . Highest education level: Not on file  Occupational History  . Occupation: retired    Comment: Therapist, nutritional  Social Needs  . Financial resource strain: Not on file  . Food insecurity    Worry: Not on file    Inability: Not on file  . Transportation needs    Medical: Not on file    Non-medical: Not on file  Tobacco Use  . Smoking status: Never Smoker  . Smokeless tobacco: Never Used  Substance and Sexual Activity  . Alcohol use: Yes    Comment: rare etoh use  . Drug use: No  . Sexual activity: Not on file  Lifestyle  . Physical activity    Days per week: Not on file    Minutes per session: Not on file  . Stress: Not on file  Relationships  . Social Musician on phone: Not on file    Gets together: Not on file    Attends religious service: Not on file    Active member of club or organization: Not on file    Attends meetings of clubs or organizations: Not on file    Relationship status: Not on file  . Intimate partner violence    Fear of current or ex partner: Not on file    Emotionally abused: Not on file    Physically abused: Not on file    Forced sexual activity: Not on file  Other Topics Concern  . Not on file  Social History Narrative   Widowed 4/15   No living will   No health care power of attorney--would want daughter [name] though   He feels he is ready for DNR---form done 08/13/19 (but he isn't sure he will put it up)    No tube feeds if cognitively unaware   Review of Systems Appetite is good Weight is fairly stable Generally sleeps okay Wears seat belt No headache No heartburn or dysphagia Bowels move okay--no blood No sig back or joint pain Teeth are good----overdue for dentist though (doesn't remember who he saw) No rash or suspicious skin lesions    Objective:   Physical Exam  Constitutional: He is oriented to person, place, and time. He appears well-developed. No distress.  HENT:  Mouth/Throat: Oropharynx is clear and moist. No oropharyngeal exudate.  Neck: No thyromegaly present.  Cardiovascular: Normal rate, regular rhythm and normal heart sounds. Exam reveals no gallop.  No murmur heard. Faint pulse right foot, absent on left  Respiratory: Effort normal and breath sounds normal. No respiratory distress. He has no wheezes. He has no rales.  GI: Soft. He exhibits no distension. There is no abdominal tenderness. There is no rebound and no guarding.  Genitourinary:    Genitourinary Comments: Large reducible right inguinal hernia   Musculoskeletal:     Comments: Trace-1+ ankle edema  Lymphadenopathy:    He has no cervical adenopathy.  Neurological: He is alert and oriented to person, place, and time.  Skin: No rash noted.  Mycotic toenails but no ulcers  Psychiatric: He has a normal mood and affect. His behavior is normal.           Assessment & Plan:

## 2019-08-13 NOTE — Assessment & Plan Note (Signed)
DNR done today--but he is still thinking about it

## 2019-08-13 NOTE — Assessment & Plan Note (Signed)
Paced now No symptomatic paroxysms On ASA only

## 2019-08-13 NOTE — Assessment & Plan Note (Signed)
Very large Easily reduces Obviously extremely low risk for strangulation Advised observation only--consider truss or support underwear

## 2019-08-13 NOTE — Progress Notes (Signed)
Hearing Screening   125Hz  250Hz  500Hz  1000Hz  2000Hz  3000Hz  4000Hz  6000Hz  8000Hz   Right ear:           Left ear:           Comments: Has hearing aids. They are in today, but batteries have died.   Visual Acuity Screening   Right eye Left eye Both eyes  Without correction: 20/50 20/70 20/50   With correction:

## 2019-08-13 NOTE — Assessment & Plan Note (Signed)
I have personally reviewed the Medicare Annual Wellness questionnaire and have noted 1. The patient's medical and social history 2. Their use of alcohol, tobacco or illicit drugs 3. Their current medications and supplements 4. The patient's functional ability including ADL's, fall risks, home safety risks and hearing or visual             impairment. 5. Diet and physical activities 6. Evidence for depression or mood disorders  The patients weight, height, BMI and visual acuity have been recorded in the chart I have made referrals, counseling and provided education to the patient based review of the above and I have provided the pt with a written personalized care plan for preventive services.  I have provided you with a copy of your personalized plan for preventive services. Please take the time to review along with your updated medication list.  No cancer screening due to age Flu vaccine today Stays active Discussed safety

## 2019-08-14 LAB — CBC
HCT: 37.2 % — ABNORMAL LOW (ref 39.0–52.0)
Hemoglobin: 12 g/dL — ABNORMAL LOW (ref 13.0–17.0)
MCHC: 32.4 g/dL (ref 30.0–36.0)
MCV: 102.6 fl — ABNORMAL HIGH (ref 78.0–100.0)
Platelets: 125 10*3/uL — ABNORMAL LOW (ref 150.0–400.0)
RBC: 3.62 Mil/uL — ABNORMAL LOW (ref 4.22–5.81)
RDW: 16.7 % — ABNORMAL HIGH (ref 11.5–15.5)
WBC: 8.1 10*3/uL (ref 4.0–10.5)

## 2019-08-14 LAB — COMPREHENSIVE METABOLIC PANEL
ALT: 12 U/L (ref 0–53)
AST: 22 U/L (ref 0–37)
Albumin: 4.4 g/dL (ref 3.5–5.2)
Alkaline Phosphatase: 59 U/L (ref 39–117)
BUN: 34 mg/dL — ABNORMAL HIGH (ref 6–23)
CO2: 28 mEq/L (ref 19–32)
Calcium: 9.5 mg/dL (ref 8.4–10.5)
Chloride: 104 mEq/L (ref 96–112)
Creatinine, Ser: 1.32 mg/dL (ref 0.40–1.50)
GFR: 50.08 mL/min — ABNORMAL LOW (ref >60.00)
Glucose, Bld: 106 mg/dL — ABNORMAL HIGH (ref 70–99)
Potassium: 4.5 mEq/L (ref 3.5–5.1)
Sodium: 141 mEq/L (ref 135–145)
Total Bilirubin: 0.7 mg/dL (ref 0.2–1.2)
Total Protein: 7.1 g/dL (ref 6.0–8.3)

## 2019-08-14 LAB — T4, FREE: Free T4: 1.09 ng/dL (ref 0.60–1.60)

## 2019-08-17 NOTE — Progress Notes (Signed)
Remote pacemaker transmission.   

## 2019-10-26 ENCOUNTER — Ambulatory Visit (INDEPENDENT_AMBULATORY_CARE_PROVIDER_SITE_OTHER): Payer: Medicare Other | Admitting: *Deleted

## 2019-10-26 DIAGNOSIS — I441 Atrioventricular block, second degree: Secondary | ICD-10-CM | POA: Diagnosis not present

## 2019-10-26 LAB — CUP PACEART REMOTE DEVICE CHECK
Battery Remaining Longevity: 96 mo
Battery Remaining Percentage: 89 %
Battery Voltage: 2.95 V
Brady Statistic AP VP Percent: 64 %
Brady Statistic AP VS Percent: 1 %
Brady Statistic AS VP Percent: 31 %
Brady Statistic AS VS Percent: 1.1 %
Brady Statistic RA Percent Paced: 9.1 %
Brady Statistic RV Percent Paced: 96 %
Date Time Interrogation Session: 20210118040821
Implantable Lead Implant Date: 20150420
Implantable Lead Implant Date: 20150420
Implantable Lead Location: 753859
Implantable Lead Location: 753860
Implantable Pulse Generator Implant Date: 20150420
Lead Channel Impedance Value: 400 Ohm
Lead Channel Impedance Value: 440 Ohm
Lead Channel Pacing Threshold Amplitude: 0.5 V
Lead Channel Pacing Threshold Amplitude: 0.75 V
Lead Channel Pacing Threshold Pulse Width: 0.4 ms
Lead Channel Pacing Threshold Pulse Width: 0.4 ms
Lead Channel Sensing Intrinsic Amplitude: 1.8 mV
Lead Channel Sensing Intrinsic Amplitude: 8.4 mV
Lead Channel Setting Pacing Amplitude: 2 V
Lead Channel Setting Pacing Amplitude: 2.5 V
Lead Channel Setting Pacing Pulse Width: 0.4 ms
Lead Channel Setting Sensing Sensitivity: 2 mV
Pulse Gen Model: 2240
Pulse Gen Serial Number: 3013717

## 2019-10-27 NOTE — Progress Notes (Signed)
PPM Remote  

## 2019-12-06 ENCOUNTER — Other Ambulatory Visit: Payer: Self-pay

## 2019-12-06 ENCOUNTER — Ambulatory Visit (HOSPITAL_COMMUNITY)
Admission: EM | Admit: 2019-12-06 | Discharge: 2019-12-06 | Disposition: A | Discharge status: Home / Self Care | Payer: Medicare Other | Attending: Family Medicine | Admitting: Family Medicine

## 2019-12-06 ENCOUNTER — Encounter (HOSPITAL_COMMUNITY): Payer: Self-pay

## 2019-12-06 DIAGNOSIS — M25421 Effusion, right elbow: Secondary | ICD-10-CM | POA: Diagnosis not present

## 2019-12-06 MED ORDER — PREDNISONE 20 MG PO TABS: ORAL_TABLET | ORAL | 0 refills | Status: DC

## 2019-12-06 NOTE — Discharge Instructions (Addendum)
Take prednisone as directed Call for problems

## 2019-12-06 NOTE — ED Provider Notes (Signed)
[facility]    CSN: [id number] Arrival date & time: 12/06/19  1220      History   Chief Complaint Chief Complaint  Patient presents with  . Elbow Pain    HPI Matthew Howard is a 86 y.o. male.   HPI  86 year old gentleman brought in by his daughter.  His daughter is a Teacher, early years/pre in [place].  She got a report that his elbow was hurting so drove into see him.  His right elbow is swollen and painful.  He states that it hurts to bend the elbow.  His big complaint is that it is hard for him to eat. He denies any trauma.  He did not bump the elbow.  He did not overuse the elbow.  Is been going on for 5 days. He states that it hurt worse yesterday, but his daughter told him to take some ibuprofen.  He took that last night and again this morning, and states his elbow is starting to feel better. He did not have any trauma or injury, no wound, fever chills or body aches. No history of gout. He does have history of heart arrhythmia and pacemaker.  He has BPH and is on medication for that.  Otherwise is in good health and lives independently  Past Medical History:  Diagnosis Date  . Atrial flutter (HCC)    not well documented  . Benign prostatic hypertrophy   . Gout   . Hyperlipidemia   . Symptomatic bradycardia    s/p dual chamber PPM implantation April 2015 [facility])    Patient Active Problem List   Diagnosis Date Noted  . Right inguinal hernia 08/13/2019  . Pedal edema 09/10/2016  . Advance directive discussed with patient 07/15/2015  . Pacemaker 04/27/2014  . Mobitz type II atrioventricular block 01/25/2014  . Routine general medical examination at a health care facility 07/01/2013  . Atrial fibrillation (HCC) 01/23/2011  . Gout 09/11/2007  . Benign prostatic hyperplasia (BPH) with urinary urgency 09/11/2007    Past Surgical History:  Procedure Laterality Date  . CATARACT EXTRACTION    . PACEMAKER INSERTION  01-25-2014   STJ Assurity dual chamber  pacemaker implanted by Dr [name] for symptomatic bradycardia  . PERMANENT PACEMAKER INSERTION N/A 01/25/2014   Procedure: PERMANENT PACEMAKER INSERTION;  Surgeon: Marinus Maw, MD;  Location: [facility] CATH LAB;  Service: Cardiovascular;  Laterality: N/A;  . TONSILLECTOMY         Home Medications    Prior to Admission medications   Medication Sig Start Date End Date Taking? Authorizing Provider  aspirin 81 MG EC tablet Take 81 mg by mouth daily.     [provider]  finasteride (PROSCAR) 5 MG tablet TAKE 1 TABLET BY MOUTH DAILY 09/12/16   [name], MD  predniSONE (DELTASONE) 20 MG tablet Take 2 tabs today then one a day for 4 days 12/06/19   Eustace Moore, MD  Tamsulosin HCl (FLOMAX) 0.4 MG CAPS Take 1 capsule (0.4 mg total) by mouth daily. 06/23/12   [name], MD    Family History Family History  Problem Relation Age of Onset  . Cancer Mother   . Throat cancer Father     Social History Social History   Tobacco Use  . Smoking status: Never Smoker  . Smokeless tobacco: Never Used  Substance Use Topics  . Alcohol use: Yes    Comment: rare etoh use  . Drug use: No     Allergies  Patient has no known allergies.   Review of Systems Review of Systems  Musculoskeletal: Positive for arthralgias and joint swelling.     Physical Exam Triage Vital Signs ED Triage Vitals  Enc Vitals Group     BP 12/06/19 1322 (!) 150/78     Pulse Rate 12/06/19 1322 70     Resp --      Temp 12/06/19 1322 98.1 F (36.7 C)     Temp Source 12/06/19 1322 Oral     SpO2 12/06/19 1322 100 %     Weight 12/06/19 1321 189 lb (85.7 kg)     Height --      Head Circumference --      Peak Flow --      Pain Score 12/06/19 1321 7     Pain Loc --      Pain Edu? --      Excl. in GC? --    No data found.  Updated Vital Signs BP (!) 150/78 (BP Location: Right Arm)   Pulse 70   Temp 98.1 F (36.7 C) (Oral)   Wt 85.7 kg   SpO2 100%   BMI 26.36 kg/m      Physical  Exam Constitutional:      General: He is not in acute distress.    Appearance: Normal appearance. He is well-developed.     Comments: Hard of hearing.  Pleasant cooperative  HENT:     Head: Normocephalic and atraumatic.     Mouth/Throat:     Comments: Mask in place Eyes:     Conjunctiva/sclera: Conjunctivae normal.     Pupils: Pupils are equal, round, and reactive to light.  Cardiovascular:     Rate and Rhythm: Normal rate and regular rhythm.     Heart sounds: Normal heart sounds.  Pulmonary:     Effort: Pulmonary effort is normal. No respiratory distress.     Breath sounds: Normal breath sounds.  Musculoskeletal:        General: Normal range of motion.     Cervical back: Normal range of motion.     Comments: Left elbow has swelling and faint erythema circumferentially from the lower bicep to the upper forearm region.  No tenderness to palpation.  He can flex to 90 degrees and extend to within 10 degrees of normal.  Normal supination and pronation.  No bony tenderness  Skin:    General: Skin is warm and dry.  Neurological:     Mental Status: He is alert. Mental status is at baseline.  Psychiatric:        Mood and Affect: Mood normal.        Behavior: Behavior normal.      UC Treatments / Results  Labs (all labs ordered are listed, but only abnormal results are displayed) Labs Reviewed - No data to display  EKG   Radiology No results found.  Procedures Procedures (including critical care time)  Medications Ordered in UC Medications - No data to display  Initial Impression / Assessment and Plan / UC Course  I have reviewed the triage vital signs and the nursing notes.  Pertinent labs & imaging results that were available during my care of the patient were reviewed by me and considered in my medical decision making (see chart for details).     I discussed with the patient that this is either an inflammatory infectious process.  Does not look infectious to me  since there is no tenderness or wound.  I believe  he has some laboratory process, especially given his improvement with ibuprofen.  We will give him a few days of prednisone and expected to resolve Final Clinical Impressions(s) / UC Diagnoses   Final diagnoses:  Elbow swelling, right     Discharge Instructions     Take prednisone as directed Call for problems   ED Prescriptions    Medication Sig Dispense Auth. Provider   predniSONE (DELTASONE) 20 MG tablet Take 2 tabs today then one a day for 4 days 6 tablet Raylene Everts, MD     PDMP not reviewed this encounter.   Raylene Everts, MD 12/06/19 1400

## 2019-12-06 NOTE — ED Triage Notes (Signed)
Pt state he has a swollen red right elbow. Pt state it hurts x 1 week .

## 2019-12-07 ENCOUNTER — Telehealth: Payer: Self-pay

## 2019-12-07 NOTE — Telephone Encounter (Signed)
[facility] Night - Client TELEPHONE ADVICE RECORD AccessNurse Patient Name: Matthew Howard Gender: Male DOB: August 23, 1919 Age: 86 Y 4 M 13 D Return Phone Number: [phone] (Primary), [phone] (Secondary) Address[place] [address] Client [facility] Night - Client Client Site [facility] - Night Physician Tillman Abide - MD Contact Type Call Who Is Calling Patient / Member / Family / Caregiver Call Type Triage / Clinical Caller Name [name] Relationship To Patient Daughter Return Phone Number [phone] (Primary) Chief Complaint Joint Pain Reason for Call Symptomatic / Request for Health Information Initial Comment Pt is having some elbow swelling and pain. Pt is unable to hold anything. Pt is also having some ankle swelling. Translation No Nurse Assessment Nurse: Dorinda Hill, RN, [place] Date/Time (Eastern Time): 12/05/2019 10:18:11 PM Confirm and document reason for call. If symptomatic, describe symptoms. ---Caller states her dad is c/o elbow pain. Yesterday he was having difficulty warming it up. His right elbow is very swollen and hot to the touch. Might be gout. Having a lot of difficulty with ROM, can not hold a cup in that hand, and can't reach up or down. Having swelling in ankle also, but not hurting as much as the elbow. Oral temp is 100. Has the patient had close contact with a person known or suspected to have the novel coronavirus illness OR traveled / lives in area with major community spread (including international travel) in the last 14 days from the onset of symptoms? * If Asymptomatic, screen for exposure and travel within the last 14 days. ---No Does the patient have any new or worsening symptoms? ---Yes Will a triage be completed? ---Yes Related visit to physician within the last 2 weeks? ---No Does the PT have any chronic conditions? (i.e. diabetes, asthma,  this includes High risk factors for pregnancy, etc.) ---Yes List chronic conditions. ---Pacemaker Prostate Is this a behavioral health or substance abuse call? ---No Guidelines Guideline Title Affirmed Question Affirmed Notes Nurse Date/Time [name] Time) Elbow Swelling [1] Elbow pain AND [2] fever Dorinda Hill, RN, Toma Copier 12/05/2019 10:25:37 PM PLEASE NOTE: All timestamps contained within this report are represented as [place] Standard Time. CONFIDENTIALTY NOTICE: This fax transmission is intended only for the addressee. It contains information that is legally privileged, confidential or otherwise protected from use or disclosure. If you are not the intended recipient, you are strictly prohibited from reviewing, disclosing, copying using or disseminating any of this information or taking any action in reliance on or regarding this information. If you have received this fax in error, please notify us immediately by telephone so that we can arrange for its return to [place]. Phone: [phone], Toll-Free: [phone], Fax: [phone] Page: 2 of 2 Call Id: [id number] Disp. Time [name] Time) Disposition Final User 12/05/2019 10:27:24 PM Go to ED Now Yes Dorinda Hill, RN, Toma Copier Caller Disagree/Comply Comply Caller Understands Yes PreDisposition Call Doctor Care Advice Given Per Guideline GO TO ED NOW: PAIN AND FEVER MEDICINES: * IBUPROFEN (E.G., MOTRIN, ADVIL): Take 400 mg (two 200 mg pills) by mouth every 6 hours. The most you should take each day is 1,200 mg (six 200 mg pills), unless your doctor has told you to take more. CARE ADVICE given per Elbow Joint Swelling (Adult) guideline. Did not want to take to ER tonight, but will go to [facility] tomorrow. Referrals [facility] [place] [facility]

## 2019-12-07 NOTE — Telephone Encounter (Signed)
Pt reports elbow and ankle swelling has gone and pain has also gone. Pt reports he still has 2 days of prednisone left. Advised if symptoms worsened or he has any new symptoms to contact this office. Gave pt phone number. Pt verbalized understanding.

## 2019-12-08 MED ORDER — COLCHICINE 0.6 MG PO TABS: 0.6000 mg | ORAL_TABLET | Freq: Two times a day (BID) | ORAL | 1 refills | Status: DC | PRN

## 2019-12-08 NOTE — Telephone Encounter (Signed)
Advised pt of new medication and educated out potential side effects. Verified pharmacy with pt. Advised if any problems with new medication to contact office. Pt verbalized understanding.   Sent in script.

## 2019-12-08 NOTE — Telephone Encounter (Signed)
There is a risk of relapse after the prednisone. Send Rx for colchicine 0.6mg  #60 x 1 1 bid prn for gout pain (warn him of possible GI side effects)

## 2019-12-30 ENCOUNTER — Other Ambulatory Visit: Payer: Self-pay | Admitting: Internal Medicine

## 2020-01-23 ENCOUNTER — Other Ambulatory Visit: Payer: Self-pay | Admitting: Internal Medicine

## 2020-01-25 ENCOUNTER — Ambulatory Visit (INDEPENDENT_AMBULATORY_CARE_PROVIDER_SITE_OTHER): Payer: Medicare Other | Admitting: *Deleted

## 2020-01-25 DIAGNOSIS — I441 Atrioventricular block, second degree: Secondary | ICD-10-CM | POA: Diagnosis not present

## 2020-01-25 LAB — CUP PACEART REMOTE DEVICE CHECK
Battery Remaining Longevity: 98 mo
Battery Remaining Percentage: 89 %
Battery Voltage: 2.95 V
Brady Statistic AP VP Percent: 64 %
Brady Statistic AP VS Percent: 1 %
Brady Statistic AS VP Percent: 31 %
Brady Statistic AS VS Percent: 1.1 %
Brady Statistic RA Percent Paced: 5.6 %
Brady Statistic RV Percent Paced: 97 %
Date Time Interrogation Session: 20210419034517
Implantable Lead Implant Date: 20150420
Implantable Lead Implant Date: 20150420
Implantable Lead Location: 753859
Implantable Lead Location: 753860
Implantable Pulse Generator Implant Date: 20150420
Lead Channel Impedance Value: 390 Ohm
Lead Channel Impedance Value: 440 Ohm
Lead Channel Pacing Threshold Amplitude: 0.5 V
Lead Channel Pacing Threshold Amplitude: 0.75 V
Lead Channel Pacing Threshold Pulse Width: 0.4 ms
Lead Channel Pacing Threshold Pulse Width: 0.4 ms
Lead Channel Sensing Intrinsic Amplitude: 1.8 mV
Lead Channel Sensing Intrinsic Amplitude: 10.5 mV
Lead Channel Setting Pacing Amplitude: 2 V
Lead Channel Setting Pacing Amplitude: 2.5 V
Lead Channel Setting Pacing Pulse Width: 0.4 ms
Lead Channel Setting Sensing Sensitivity: 2 mV
Pulse Gen Model: 2240
Pulse Gen Serial Number: 3013717

## 2020-01-25 NOTE — Progress Notes (Signed)
PPM Remote  

## 2020-03-29 ENCOUNTER — Encounter: Payer: Self-pay | Admitting: Surgery

## 2020-03-29 DIAGNOSIS — K4090 Unilateral inguinal hernia, without obstruction or gangrene, not specified as recurrent: Secondary | ICD-10-CM

## 2020-03-29 HISTORY — DX: Unilateral inguinal hernia, without obstruction or gangrene, not specified as recurrent: K40.90

## 2020-03-30 ENCOUNTER — Telehealth: Payer: Self-pay | Admitting: *Deleted

## 2020-03-30 NOTE — Telephone Encounter (Signed)
I called to s/w pt in regards to needing a pre op clearance appt. Woman answered the phone and said the pt was out on the lawn mower and she will have him call back when he gets in.   In regards to appt, I did not see anything in our [facility] office, though looks like we could have the pt if willing be seen in the [facility] office 04/07/20, saw a couple of openings with APP's there.

## 2020-03-30 NOTE — Telephone Encounter (Signed)
   [facility] Pre-operative Risk Assessment    HEARTCARE STAFF: - Please ensure there is not already an duplicate clearance open for this procedure. - Under Visit Info/Reason for Call, type in Other and utilize the format Clearance MM/DD/YY or Clearance TBD. Do not use dashes or single digits. - If request is for dental extraction, please clarify the # of teeth to be extracted.  Request for surgical clearance:  1. What type of surgery is being performed? RIGHT INGUINAL HERNIA REPAIR   2. When is this surgery scheduled? TBD   3. What type of clearance is required (medical clearance vs. Pharmacy clearance to hold med vs. Both)? MEDICAL  4. Are there any medications that need to be held prior to surgery and how long? ASA    5. Practice name and name of physician performing surgery? [facility]; DR. Johney Maine   6. What is the office phone number? [phone]   7.   What is the office fax number? [place], CMA  8.   Anesthesia type (None, local, MAC, general) ? GENERAL   Julaine Hua 03/30/2020, 8:35 AM  _________________________________________________________________   (provider comments below)

## 2020-03-30 NOTE — Telephone Encounter (Signed)
   Primary Cardiologist:Mark Anne Fu, MD  Chart reviewed as part of pre-operative protocol coverage. Because of Justis Closser Cerutti's past medical history and time since last visit, they will require a follow-up visit in order to better assess preoperative cardiovascular risk.  Pre-op covering staff: - Please schedule appointment and call patient to inform them. If patient already had an upcoming appointment within acceptable timeframe, please add "pre-op clearance" to the appointment notes so provider is aware. - Please contact requesting surgeon's office via preferred method (i.e, phone, fax) to inform them of need for appointment prior to surgery.  If applicable, this message will also be routed to pharmacy pool and/or primary cardiologist for input on holding anticoagulant/antiplatelet agent as requested below so that this information is available to the clearing provider at time of patient's appointment.   [place], [place]  03/30/2020, 8:45 AM

## 2020-03-31 NOTE — Telephone Encounter (Signed)
Called the patient's daughter Matthew Howard who is on the patient's DPR on file. I explained that I was calling from the preop pool for [facility], and that I was calling her due to her farther being hard of hearing and he could not hear nor understand what I was saying that I call her to inform her of what I was trying to get her father scheduled for an appointment. [name] stated that she would need to be near him to know his schedule and that she was not able to be at her fathers house today and asked if I could call back tomorrow 04/01/20 between 2PM-3PM. I let her know that I will document that someone will need to give her a call or her father a call tomorrow 04/01/20 between 2PM-3PM to get the appointment scheduled.

## 2020-03-31 NOTE — Telephone Encounter (Signed)
Called and spoke with patient who is hard of hearing and he did not understand what I was saying. Patient was asked if there was someone was there that could help with the call. Patient stated for me to call his daughter Jaziel Bennett and gave me the number to call her at [phone]. Kymere Fullington is on the DPR on file for the patient. Informed patient that I will be giving her a call.

## 2020-04-01 NOTE — Telephone Encounter (Signed)
Called Matthew Howard and spoke with his son who is POA and stated that he will get the paperwork to our office for his fathers file. Patient is scheduled to see Joni Reining on 04/26/20 at 8:15 AM

## 2020-04-25 ENCOUNTER — Ambulatory Visit (INDEPENDENT_AMBULATORY_CARE_PROVIDER_SITE_OTHER): Payer: Medicare Other | Admitting: *Deleted

## 2020-04-25 DIAGNOSIS — I441 Atrioventricular block, second degree: Secondary | ICD-10-CM | POA: Diagnosis not present

## 2020-04-25 LAB — CUP PACEART REMOTE DEVICE CHECK
Battery Remaining Longevity: 87 mo
Battery Remaining Percentage: 80 %
Battery Voltage: 2.93 V
Brady Statistic AP VP Percent: 64 %
Brady Statistic AP VS Percent: 1 %
Brady Statistic AS VP Percent: 31 %
Brady Statistic AS VS Percent: 1.1 %
Brady Statistic RA Percent Paced: 4.1 %
Brady Statistic RV Percent Paced: 97 %
Date Time Interrogation Session: 20210719022832
Implantable Lead Implant Date: 20150420
Implantable Lead Implant Date: 20150420
Implantable Lead Location: 753859
Implantable Lead Location: 753860
Implantable Pulse Generator Implant Date: 20150420
Lead Channel Impedance Value: 390 Ohm
Lead Channel Impedance Value: 440 Ohm
Lead Channel Pacing Threshold Amplitude: 0.5 V
Lead Channel Pacing Threshold Amplitude: 0.75 V
Lead Channel Pacing Threshold Pulse Width: 0.4 ms
Lead Channel Pacing Threshold Pulse Width: 0.4 ms
Lead Channel Sensing Intrinsic Amplitude: 1.8 mV
Lead Channel Sensing Intrinsic Amplitude: 9.9 mV
Lead Channel Setting Pacing Amplitude: 2 V
Lead Channel Setting Pacing Amplitude: 2.5 V
Lead Channel Setting Pacing Pulse Width: 0.4 ms
Lead Channel Setting Sensing Sensitivity: 2 mV
Pulse Gen Model: 2240
Pulse Gen Serial Number: 3013717

## 2020-04-25 NOTE — Progress Notes (Signed)
Cardiology Office Note   Date:  04/26/2020   ID:  Matthew Howard, DOB 19-Jul-1919, MRN [id number]  PCP:  Matthew Schwalbe, MD  Cardiologist:  Matthew Howard CC: Follow Up   History of Present Illness: Matthew Howard is a 86 y.o. male who presents for ongoing assessment and management of paroxysmal atrial fibrillation, (not on anticoagulation therapy due to advanced Howard), hyperlipidemia, borderline hypertension, chronic diastolic heart failure with class II type symptoms, & complete heart block status post pacemaker (St[employer] Jude [phone]) by Dr. Ladona Howard.  Was last seen by Matthew Howard on 12/16/2018 and was doing well without complaints of dyspnea, palpitations, fatigue, or chest discomfort.  Last pacemaker remote check on 01/25/2020, device check histograms were appropriate, leads and battery stable for patient.  He comes today with his daughter.  He states he is feeling well as active as he can be "for my Howard", works in his yard, rests when he needs to.  He denies recurrent chest pain, dyspnea on exertion, dizziness, or significant fatigue.  He is medically compliant.  The patient has been planned for right inguinal hernia repair by Dr. Star Howard on date to be determined.  He is here for preoperative evaluation.  Past Medical History:  Diagnosis Date  . Atrial flutter (HCC)    not well documented  . Benign prostatic hypertrophy   . Gout   . Hyperlipidemia   . Symptomatic bradycardia    s/p dual chamber PPM implantation April 2015 [facility])    Past Surgical History:  Procedure Laterality Date  . CATARACT EXTRACTION    . PACEMAKER INSERTION  01-25-2014   STJ Assurity dual chamber pacemaker implanted by Dr Matthew Howard for symptomatic bradycardia  . PERMANENT PACEMAKER INSERTION N/A 01/25/2014   Procedure: PERMANENT PACEMAKER INSERTION;  Surgeon: Matthew Maw, MD;  Location: [facility] CATH LAB;  Service: Cardiovascular;  Laterality: N/A;  . TONSILLECTOMY       Current Outpatient  Medications  Medication Sig Dispense Refill  . aspirin 81 MG EC tablet Take 81 mg by mouth daily.     . finasteride (PROSCAR) 5 MG tablet TAKE 1 TABLET BY MOUTH DAILY 90 tablet 3  . Tamsulosin HCl (FLOMAX) 0.4 MG CAPS Take 1 capsule (0.4 mg total) by mouth daily. 90 capsule 1   No current facility-administered medications for this visit.    Allergies:   Patient has no known allergies.    Social History:  The patient  reports that he has never smoked. He has never used smokeless tobacco. He reports current alcohol use. He reports that he does not use drugs.   Family History:  The patient's family history includes Cancer in his mother; Throat cancer in his father.    ROS: All other systems are reviewed and negative. Unless otherwise mentioned in H&P    PHYSICAL EXAM: VS:  BP 126/68   Pulse 70   Ht 5\' 11"  (1.803 m)   Wt 180 lb (81.6 kg)   BMI 25.10 kg/m  , BMI Body mass index is 25.1 kg/m. GEN: Well nourished, well developed, in no acute distress HEENT: normal Neck: no JVD, carotid bruits, or masses Cardiac: RRR; 2/6 systolic murmurs, heard best at the apex, no rubs, or gallops, he has 2+ bilateral ankle edema, with venous skin recent skin changes.  Respiratory:  Clear to auscultation bilaterally, normal work of breathing GI: soft, nontender, nondistended, + BS MS: no deformity or atrophy Skin: warm and dry, no rash Neuro:  Strength and sensation  are intact, hard of hearing  Psych: euthymic mood, full affect   EKG: Ventricular paced rhythm, rate of 70 bpm.  Recent Labs: 08/13/2019: ALT 12; BUN 34; Creatinine, Ser 1.32; Hemoglobin 12.0; Platelets 125.0; Potassium 4.5; Sodium 141    Lipid Panel    Component Value Date/Time   CHOL 141 11/01/2008 1534   TRIG 104 11/01/2008 1534   HDL 43.4 11/01/2008 1534   CHOLHDL 3.2 CALC 11/01/2008 1534   VLDL 21 11/01/2008 1534   LDLCALC 77 11/01/2008 1534      Wt Readings from Last 3 Encounters:  04/26/20 180 lb (81.6 kg)    12/06/19 189 lb (85.7 kg)  08/13/19 180 lb (81.6 kg)      Other studies Reviewed: None  ASSESSMENT AND PLAN:  1.  Preoperative cardiac evaluation: Mr. Howard is due for right inguinal hernia repair on date to be determined.  He has not had a recent cardiac assessment.  I will order echocardiogram to evaluate his LV function and mitral valve murmur.  This will help to identify any issues with heart function that would preclude him from having surgery.  I have explained this to him and his daughter and they verbalized understanding.  He will go today to have his echocardiogram completed as they do have an opening.  He will follow-up with Matthew Howard in 3 months to have ongoing management.  2.  PAF: He is not a candidate for anticoagulation therapy due to Howard.  He is not complaining of any rapid heart rhythm, or skipping at this time.  He denies dyspnea or chest discomfort.  He is not on AV nodal blocking agents.  3.  Pacemaker in situ: [employer] placed in 2015 by Dr. Ladona Howard in the setting of complete heart block.  He has remote checks and follow-up in person check scheduled for August 23 with Dr. Ladona Howard.  4.  Chronic diastolic CHF: He does have some mild lower extremity edema in the dependent position.  He is currently not on diuretic.  Will assess LV function with echo today.   Current medicines are reviewed at length with the patient today.  I have spent 25 minutes dedicated to the care of this patient on the date of this encounter to include pre-visit review of records, assessment, management and diagnostic testing,with shared decision making.  Labs/ tests ordered today include: Echocardiogram   Matthew Howard. Matthew Howard, ANP, AACC   04/26/2020 9:24 AM    [facility] [address] Office [phone] Fax [phone]  Notice: This dictation was prepared with Dragon dictation along with smaller phrase technology. Any transcriptional errors  that result from this process are unintentional and may not be corrected upon review.

## 2020-04-26 ENCOUNTER — Encounter: Payer: Self-pay | Admitting: Adult Health

## 2020-04-26 ENCOUNTER — Ambulatory Visit (HOSPITAL_COMMUNITY): Payer: Medicare Other | Attending: Adult Health

## 2020-04-26 ENCOUNTER — Ambulatory Visit: Payer: Medicare Other | Admitting: Adult Health

## 2020-04-26 ENCOUNTER — Other Ambulatory Visit: Payer: Self-pay

## 2020-04-26 VITALS — BP 126/68 | HR 70 | Ht 71.0 in | Wt 180.0 lb

## 2020-04-26 DIAGNOSIS — I480 Paroxysmal atrial fibrillation: Secondary | ICD-10-CM

## 2020-04-26 DIAGNOSIS — Z950 Presence of cardiac pacemaker: Secondary | ICD-10-CM | POA: Diagnosis not present

## 2020-04-26 DIAGNOSIS — Z01810 Encounter for preprocedural cardiovascular examination: Secondary | ICD-10-CM | POA: Diagnosis not present

## 2020-04-26 DIAGNOSIS — I5032 Chronic diastolic (congestive) heart failure: Secondary | ICD-10-CM

## 2020-04-26 LAB — ECHOCARDIOGRAM COMPLETE
Area-P 1/2: 2.95 cm2
Height: 71 in
P 1/2 time: 452 msec
S' Lateral: 3.8 cm
Weight: 2880 oz

## 2020-04-26 NOTE — Patient Instructions (Signed)
Medication Instructions:  Continue current medications  *If you need a refill on your cardiac medications before your next appointment, please call your pharmacy*   Lab Work: None ordered   Testing/Procedures: Your physician has requested that you have an echocardiogram. Echocardiography is a painless test that uses sound waves to create images of your heart. It provides your doctor with information about the size and shape of your heart and how well your heart's chambers and valves are working. This procedure takes approximately one hour. There are no restrictions for this procedure.   Follow-Up: At [facility], you and your health needs are our priority.  As part of our continuing mission to provide you with exceptional heart care, we have created designated Provider Care Teams.  These Care Teams include your primary Cardiologist (physician) and Advanced Practice Providers (APPs -  Physician Assistants and Nurse Practitioners) who all work together to provide you with the care you need, when you need it.  We recommend signing up for the patient portal called "MyChart".  Sign up information is provided on this After Visit Summary.  MyChart is used to connect with patients for Virtual Visits (Telemedicine).  Patients are able to view lab/test results, encounter notes, upcoming appointments, etc.  Non-urgent messages can be sent to your provider as well.   To learn more about what you can do with MyChart, go to [email or web address].    Your next appointment:   3 month(s)  The format for your next appointment:   In Person  Provider:   You may see [name], MD or one of the following Advanced Practice Providers on your designated Care Team:    Norma Fredrickson, NP  Nada Boozer, NP  Georgie Chard, NP

## 2020-04-27 NOTE — Progress Notes (Signed)
Remote pacemaker transmission.   

## 2020-04-28 NOTE — Telephone Encounter (Addendum)
See Samara Deist Lawrence's note from 7/20- the patient needs an echo first. This was explained to the patient and his daughter.  What is the son's number?  Corine Shelter PA-C 04/28/2020 11:29 AM

## 2020-04-28 NOTE — Telephone Encounter (Signed)
I see that the echo was done and he was cleared.  I will fax clearance to the surgeon's office.  Corine Shelter PA-C 04/28/2020 11:35 AM

## 2020-04-28 NOTE — Telephone Encounter (Signed)
   Primary Cardiologist: [name], MD  Chart reviewed as part of pre-operative protocol coverage. Given past medical history and time since last visit, based on ACC/AHA guidelines, KYI ROMANELLO would be at acceptable risk for the planned procedure without further cardiovascular testing.   Ok to hold aspirin 5 days pre op if needed.  I will route this recommendation to the requesting party via Epic fax function and remove from pre-op pool.  Please call with questions.  Corine Shelter, PA-C 04/28/2020, 11:38 AM

## 2020-04-28 NOTE — Telephone Encounter (Signed)
Patient's son following up on surgical clearance. He wants to know if his fathers assessment has been faxed to the surgeons office. Please advise.

## 2020-05-10 ENCOUNTER — Ambulatory Visit: Payer: Self-pay | Admitting: Surgery

## 2020-05-10 NOTE — H&P (Signed)
Matthew Howard Appointment: 03/29/2020 8:45 AM Location: [facility] Patient #: [id number] DOB: Feb 07, 1919 Married / Language: [place] / Race: White Male   History of Present Illness Matthew Sportsman MD; 03/29/2020 1:18 PM) The patient is a 86 year old male who presents with an inguinal hernia. Note for "Inguinal hernia": ` ` ` Patient sent for surgical consultation at the request of [facility]  Chief Complaint: Worsening inguinal hernia ` ` The patient is a 86 male. He is active and lives alone at home. Does his own errands. He does have a daughter that helps visit him as well. He has had an inguinal hernia on his right groin for "ages". However it is gotten gradually larger over the time. It goes down and fills the scrotum. It is difficult for him to urinate as the penis is "gone". He wears diapers. Annoying and frustrated. Occasionally uncomfortable but usually can get it to reduce. He has been having some urinary issues on prostate medications. Patient wished to try and improve things. His urologist offered surgical evaluation to see if patient is a candidate for hernia repair.  Patient comes by himself. However later in the visit his daughter was here and is able to talk with her as well. He denies any abdominal surgery. He can claims he can walk 15-20 minutes without difficulty. He usually moves his bowels about every other day. He is very hard of hearing. He is not on a lot of medications. He denies any pulmonary issues. He has had episodes of proximal atrial fibrillation and does have a pacemaker. Does not sound like he has had any major issues with it for many years. Followed by Matthew. Anne Fu and Matthew Howard with the [facility] health cardiac group  (Review of systems as stated in this history (HPI) or in the review of systems. Otherwise all other 12 point ROS are negative) ` ` `  This patient encounter took 50 minutes today to perform the  following: obtain history, perform exam, review outside records, interpret tests & imaging, counsel the patient on their diagnosis; and, document this encounter, including findings & plan in the electronic health record (EHR).   Past Surgical History [facility] Matthew Howard, CMA; 03/29/2020 8:47 AM) Valve Replacement   Allergies (Matthew Howard, CMA; 03/29/2020 8:47 AM) No Known Drug Allergies  [03/29/2020]: Allergies Reconciled   Medication History (Matthew Howard, CMA; 03/29/2020 8:48 AM) Colchicine (0.6MG  Tablet, Oral) Active. Proscar (5MG  Tablet, Oral) Active. predniSONE (20MG  Tablet, Oral) Active. Flomax (0.4MG  Capsule, Oral) Active. Medications Reconciled  Social History , CMA; 03/29/2020 8:47 AM) Caffeine use  Carbonated beverages, Coffee. No alcohol use  No drug use  Tobacco use  Never smoker.  Family History Matthew Howard, 03/31/2020; 03/29/2020 8:47 AM) Family history unknown  First Degree Relatives   Other Problems (Matthew [place], CMA; 03/29/2020 8:47 AM) Inguinal Hernia     Review of Systems (Matthew Nolan CMA; 03/29/2020 8:47 AM) General Not Present- Appetite Loss, Chills, Fatigue, Fever, Night Sweats, Weight Gain and Weight Loss. Skin Not Present- Change in Wart/Mole, Dryness, Hives, Jaundice, New Lesions, Non-Healing Wounds, Rash and Ulcer. HEENT Not Present- Earache, Hearing Loss, Hoarseness, Nose Bleed, Oral Ulcers, Ringing in the Ears, Seasonal Allergies, Sinus Pain, Sore Throat, Visual Disturbances, Wears glasses/contact lenses and Yellow Eyes. Respiratory Not Present- Bloody sputum, Chronic Cough, Difficulty Breathing, Snoring and Wheezing. Breast Not Present- Breast Mass, Breast Pain, Nipple Discharge and Skin Changes. Cardiovascular Present- Leg Cramps and Swelling of Extremities. Not Present- Chest Pain,  Difficulty Breathing Lying Down, Palpitations, Rapid Heart Rate and Shortness of Breath. Gastrointestinal Present- Change in Bowel Habits. Not Present-  Abdominal Pain, Bloating, Bloody Stool, Chronic diarrhea, Constipation, Difficulty Swallowing, Excessive gas, Gets full quickly at meals, Hemorrhoids, Indigestion, Nausea, Rectal Pain and Vomiting. Male Genitourinary Present- Change in Urinary Stream. Not Present- Blood in Urine, Frequency, Impotence, Nocturia, Painful Urination, Urgency and Urine Leakage.  Vitals (Matthew Nolan CMA; 03/29/2020 8:48 AM) 03/29/2020 8:48 AM Weight: 179.25 lb Height: 68in Body Surface Area: 1.95 m Body Mass Index: 27.25 kg/m  Temp.: 97.19F  Pulse: 72 (Regular)        Physical Exam Matthew Howard(Matthew Pine C. Marymargaret Kirker MD; 03/29/2020 9:25 AM) General Mental Status-Alert. General Appearance-Not in acute distress, Not Sickly. Orientation-Oriented X3. Hydration-Well hydrated. Voice-Normal.  Integumentary Global Assessment Upon inspection and palpation of skin surfaces of the - Axillae: non-tender, no inflammation or ulceration, no drainage. and Distribution of scalp and body hair is normal. General Characteristics Temperature - normal warmth is noted.  Head and Neck Head-normocephalic, atraumatic with no lesions or palpable masses. Face Global Assessment - atraumatic, no absence of expression. Neck Global Assessment - no abnormal movements, no bruit auscultated on the right, no bruit auscultated on the left, no decreased range of motion, non-tender. Trachea-midline. Thyroid Gland Characteristics - non-tender.  Eye Eyeball - Left-Extraocular movements intact, No Nystagmus - Left. Eyeball - Right-Extraocular movements intact, No Nystagmus - Right. Cornea - Left-No Hazy - Left. Cornea - Right-No Hazy - Right. Sclera/Conjunctiva - Left-No scleral icterus, No Discharge - Left. Sclera/Conjunctiva - Right-No scleral icterus, No Discharge - Right. Pupil - Left-Direct reaction to light normal. Pupil - Right-Direct reaction to light normal.  ENMT Ears Pinna - Left - no drainage  observed, no generalized tenderness observed. Pinna - Right - no drainage observed, no generalized tenderness observed. Nose and Sinuses External Inspection of the Nose - no destructive lesion observed. Inspection of the nares - Left - quiet respiration. Inspection of the nares - Right - quiet respiration. Mouth and Throat Lips - Upper Lip - no fissures observed, no pallor noted. Lower Lip - no fissures observed, no pallor noted. Nasopharynx - no discharge present. Oral Cavity/Oropharynx - Tongue - no dryness observed. Oral Mucosa - no cyanosis observed. Hypopharynx - no evidence of airway distress observed. Note: Hard of hearing   Chest and Lung Exam Inspection Movements - Normal and Symmetrical. Accessory muscles - No use of accessory muscles in breathing. Palpation Palpation of the chest reveals - Non-tender. Auscultation Breath sounds - Normal and Clear.  Cardiovascular Auscultation Rhythm - Regular. Murmurs & Other Heart Sounds - Auscultation of the heart reveals - No Murmurs and No Systolic Clicks.  Abdomen Inspection Inspection of the abdomen reveals - No Visible peristalsis and No Abnormal pulsations. Umbilicus - No Bleeding, No Urine drainage. Palpation/Percussion Palpation and Percussion of the abdomen reveal - Soft, Non Tender, No Rebound tenderness, No Rigidity (guarding) and No Cutaneous hyperesthesia. Note: Abdomen soft. Moderate diastases recti Nontender. Not distended. No umbilical or incisional hernias. No guarding.   Male Genitourinary Sexual Maturity Tanner 5 - Adult hair pattern and Adult penile size and shape. Note: Large right inguinal hernia going down to scrotum. Suspect bowel within it. Reducible. Small impulse on left side suspicious for indirect left inguinal hernia as well.  Uncircumcised. Redundant foreskin. However is is noted. No obvious testicular masses. Mild perineal moisture but no major rash or Candida.   Peripheral Vascular Upper  Extremity Inspection - Left - No Cyanotic nailbeds - Left,  Not Ischemic. Inspection - Right - No Cyanotic nailbeds - Right, Not Ischemic.  Neurologic Neurologic evaluation reveals -normal attention span and ability to concentrate, able to name objects and repeat phrases. Appropriate fund of knowledge , normal sensation and normal coordination. Mental Status Affect - not angry, not paranoid. Cranial Nerves-Normal Bilaterally. Gait-Normal.  Neuropsychiatric Mental status exam performed with findings of-able to articulate well with normal speech/language, rate, volume and coherence, thought content normal with ability to perform basic computations and apply abstract reasoning and no evidence of hallucinations, delusions, obsessions or homicidal/suicidal ideation.  Musculoskeletal Global Assessment Spine, Ribs and Pelvis - no instability, subluxation or laxity. Right Upper Extremity - no instability, subluxation or laxity.  Lymphatic Head & Neck  General Head & Neck Lymphatics: Bilateral - Description - No Localized lymphadenopathy. Axillary  General Axillary Region: Bilateral - Description - No Localized lymphadenopathy. Femoral & Inguinal  Generalized Femoral & Inguinal Lymphatics: Left - Description - No Localized lymphadenopathy. Right - Description - No Localized lymphadenopathy.    Assessment & Plan Matthew Sportsman MD; 03/29/2020 1:14 PM) Matthew Howard HERNIA (K40.90) Impression: Large right inguinal hernia going down to scrotum. Reducible. Standard of care would be repair of the hernia. Given the fact he has bilateral hernias & this right side a rather large defect, I would do laparoscopic preperitoneal approach under general anesthesia with underlay mesh and overlapping mesh to minimize recurrence.  Usually this can be an outpatient surgery but given his history of atrial fibrillation pacemaker and a very advanced age, would observe overnight.  I would definitely want  cardiac clearance to see if they can feel he can tolerate this operation. Despite his advanced age she seems to be relatively functional and active. He lives by himself and runs errnads, drives, etc. His daughter is with him. I get the sense that she helps him more than he admits. Concern I have is if this an emergency, then the risk of be markedly higher.  He seemed to vacillate. Initially complaining of difficulties urinating & keep the area clean and he wanted surgery. Then by the end of the visit he wanted to talk to the cardiologist as well first. I think that is reasonable. If it is not really bothersome and reducible he can observe only. If surgery risks are high, then observe. However it is larger and will get bigger. No situation is zero risk. Emergency situation would be much higher risk. I think he is leaning towards surgery to minimize more problems & future emergency. LEFT INGUINAL HERNIA (K40.90) Impression: Small but definite left inguinal hernia. Would do a contralateral repair as well BPH (BENIGN PROSTATIC HYPERPLASIA) (N40.0) Impression: History of BPH followed by urology on prostate medications. He wears a diaper over concerns of urinary overflow or difficulty with urination.  He is at high risk for urinary retention. Might leave a Foley catheter for a few days postop to minimize at risk. Matthew. Annabell Howells suspects bladder involvement & suspects the patient's urination would be improved with the large scrotal hernia reduced & repaired PACEMAKER (Z95.0) Current Plans I recommended obtaining preoperative cardiac clearance. I am concerned about the health of the patient and the ability to tolerate the operation. Therefore, we will request clearance by cardiology to better assess operative risk & see if a reevaluation, further workup, etc is needed. Also recommendations on how medications such as for anticoagulation and blood pressure should be managed/held/restarted after surgery. PREOP - ING  HERNIA - ENCOUNTER FOR PREOPERATIVE EXAMINATION FOR GENERAL SURGICAL PROCEDURE (Z01.818)  Current Plans You are being scheduled for surgery- Our schedulers will call you.  You should hear from our office's scheduling department within 5 working days about the location, date, and time of surgery. We try to make accommodations for patient's preferences in scheduling surgery, but sometimes the OR schedule or the surgeon's schedule prevents [place] from making those accommodations.  If you have not heard from our office [phone]) in 5 working days, call the office and ask for your surgeon's nurse.  If you have other questions about your diagnosis, plan, or surgery, call the office and ask for your surgeon's nurse.  Written instructions provided The anatomy & physiology of the abdominal wall and pelvic floor was discussed. The pathophysiology of hernias in the inguinal and pelvic region was discussed. Natural history risks such as progressive enlargement, pain, incarceration, and strangulation was discussed. Contributors to complications such as smoking, obesity, diabetes, prior surgery, etc were discussed.  I feel the risks of no intervention will lead to serious problems that outweigh the operative risks; therefore, I recommended surgery to reduce and repair the hernia. I explained laparoscopic techniques with possible need for an open approach. I noted usual use of mesh to patch and/or buttress hernia repair  Risks such as bleeding, infection, abscess, need for further treatment, heart attack, death, and other risks were discussed. I noted a good likelihood this will help address the problem. Goals of post-operative recovery were discussed as well. Possibility that this will not correct all symptoms was explained. I stressed the importance of low-impact activity, aggressive pain control, avoiding constipation, & not pushing through pain to minimize risk of post-operative chronic pain or  injury. Possibility of reherniation was discussed. We will work to minimize complications.  An educational handout further explaining the pathology & treatment options was given as well. Questions were answered. The patient expresses understanding & wishes to proceed with surgery.  Pt Education - Pamphlet Given - Laparoscopic Hernia Repair: discussed with patient and provided information. Pt Education - CCS Hernia Post-Op HCI (Tasha Jindra): discussed with patient and provided information. Pt Education - CCS Mesh education: discussed with patient and provided information. HOH (HARD OF HEARING) (H91.90)  ADDENDUM:   Seen by cardiology.  Echocardiogram showing good cardiac function.  No contraindications for hernia surgery.  We will schedule.

## 2020-05-30 ENCOUNTER — Other Ambulatory Visit: Payer: Self-pay

## 2020-05-30 ENCOUNTER — Ambulatory Visit (INDEPENDENT_AMBULATORY_CARE_PROVIDER_SITE_OTHER): Payer: Medicare Other | Admitting: Internal Medicine

## 2020-05-30 VITALS — BP 136/60 | HR 54 | Ht 71.0 in | Wt 168.0 lb

## 2020-05-30 DIAGNOSIS — R001 Bradycardia, unspecified: Secondary | ICD-10-CM | POA: Diagnosis not present

## 2020-05-30 DIAGNOSIS — Z950 Presence of cardiac pacemaker: Secondary | ICD-10-CM

## 2020-05-30 DIAGNOSIS — I480 Paroxysmal atrial fibrillation: Secondary | ICD-10-CM

## 2020-05-30 NOTE — Patient Instructions (Signed)

## 2020-05-30 NOTE — Progress Notes (Signed)
HPI Matthew Howard returns today for ongoing evaluation and management of complete heart block and sinus node dysfunction, status post pacemaker insertion.  He has a history of chronic dyspnea with exertion.  He has atrial fibrillation.  In the past he has refused systemic anticoagulation.  In addition there is been concerned about his advanced age.  He still eats too much salt.  However he denies much in the way of heart failure symptoms. No Known Allergies   Current Outpatient Medications  Medication Sig Dispense Refill  . aspirin 81 MG EC tablet Take 81 mg by mouth daily.     . finasteride (PROSCAR) 5 MG tablet TAKE 1 TABLET BY MOUTH DAILY 90 tablet 3  . Tamsulosin HCl (FLOMAX) 0.4 MG CAPS Take 1 capsule (0.4 mg total) by mouth daily. 90 capsule 1   No current facility-administered medications for this visit.     Past Medical History:  Diagnosis Date  . Advance directive discussed with patient 07/15/2015  . Atrial fibrillation (HCC) 01/23/2011  . Atrial flutter (HCC)    not well documented  . Benign prostatic hyperplasia (BPH) with urinary urgency 09/11/2007   Qualifier: Diagnosis of  By: Alphonsus Sias MD, Ronnette Hila   . Benign prostatic hypertrophy   . Gout   . Gout 09/11/2007   Qualifier: Diagnosis of  By: Alphonsus Sias MD, Ronnette Hila   . Hyperlipidemia   . Left inguinal hernia 03/29/2020  . Mobitz type II atrioventricular block 01/25/2014  . Pacemaker 04/27/2014  . Pedal edema 09/10/2016  . Routine general medical examination at a health care facility 07/01/2013  . Scrotal right inuinal hernia 08/13/2019  . Symptomatic bradycardia    s/p dual chamber PPM implantation April 2015 [facility])    ROS:   All systems reviewed and negative except as noted in the HPI.   Past Surgical History:  Procedure Laterality Date  . CATARACT EXTRACTION    . PACEMAKER INSERTION  01-25-2014   STJ Assurity dual chamber pacemaker implanted by Dr [name] for symptomatic bradycardia  . PERMANENT  PACEMAKER INSERTION N/A 01/25/2014   Procedure: PERMANENT PACEMAKER INSERTION;  Surgeon: Marinus Maw, MD;  Location: [facility] C[facility];  Service: Cardiovascular;  Laterality: N/A;  . TONSILLECTOMY       Family History  Problem Relation Age of Onset  . Cancer Mother   . Throat cancer Father      Social History   Socioeconomic History  . Marital status: Widowed    Spouse name: Destin Vinsant  . Number of children: 4  . Years of education: Not on file  . Highest education level: Not on file  Occupational History  . Occupation: retired    Comment: [employer]  Tobacco Use  . Smoking status: Never Smoker  . Smokeless tobacco: Never Used  Vaping Use  . Vaping Use: Never used  Substance and Sexual Activity  . Alcohol use: Yes    Comment: rare etoh use  . Drug use: No  . Sexual activity: Not on file  Other Topics Concern  . Not on file  Social History Narrative   Widowed 4/15   No living will   No health care power of attorney--would want daughter [name] though   He feels he is ready for DNR---form done 08/13/19 (but he isn't sure he will put it up)   No tube feeds if cognitively unaware   Social Determinants of Health   Financial Resource Strain:   . Difficulty of Paying  Living Expenses: Not on file  Food Insecurity:   . Worried About Programme researcher, broadcasting/film/video in the Last Year: Not on file  . Ran Out of Food in the Last Year: Not on file  Transportation Needs:   . Lack of Transportation (Medical): Not on file  . Lack of Transportation (Non-Medical): Not on file  Physical Activity:   . Days of Exercise per Week: Not on file  . Minutes of Exercise per Session: Not on file  Stress:   . Feeling of Stress : Not on file  Social Connections:   . Frequency of Communication with Friends and Family: Not on file  . Frequency of Social Gatherings with Friends and Family: Not on file  . Attends Religious Services: Not on file  . Active Member of Clubs or Organizations: Not on file  .  Attends Banker Meetings: Not on file  . Marital Status: Not on file  Intimate Partner Violence:   . Fear of Current or Ex-Partner: Not on file  . Emotionally Abused: Not on file  . Physically Abused: Not on file  . Sexually Abused: Not on file     BP 136/60   Pulse (!) 54   Ht 5\' 11"  (1.803 m)   Wt 168 lb (76.2 kg)   SpO2 99%   BMI 23.43 kg/m   Physical Exam:  Well appearing elderly man who looks younger than his stated age, NAD HEENT: Unremarkable Neck:  No JVD, no thyromegally Lymphatics:  No adenopathy Back:  No CVA tenderness Lungs:  Clear, with no wheezes, rales, or rhonchi HEART:  Regular rate rhythm, no murmurs, no rubs, no clicks Abd:  soft, positive bowel sounds, no organomegally, no rebound, no guarding Ext:  2 plus pulses, no edema, no cyanosis, no clubbing Skin:  No rashes no nodules Neuro:  CN II through XII intact, motor grossly intact  DEVICE  Normal device function.  See PaceArt for details.   Assess/Plan: 1.  Persistent atrial fibrillation -the patient is asymptomatic.  I discussed the option of stopping aspirin and trying an oral anticoagulant.  I recommended this because of his risk of stroke and relatively low risk of bleeding, but he refuses. 2.  Complete heart block -he had a ventricular escape of 35 bpm today.  He is asymptomatic status post pacemaker insertion 3.  Pacemaker -interrogation of his [employer] dual-chamber pacemaker demonstrates normal device function.  , MD

## 2020-06-06 ENCOUNTER — Telehealth: Payer: Self-pay

## 2020-06-06 NOTE — Telephone Encounter (Signed)
Laceration needs to be evaluation, he needs to be seen in person, I hate to send him to the ER, but likely too much for Urgent care. Any possibility of getting him in with PCP tomorrow?

## 2020-06-06 NOTE — Telephone Encounter (Signed)
[facility] Day - Client TELEPHONE ADVICE RECORD AccessNurse Patient Name: Matthew Howard Gender: Male DOB: [birth year] Age: 86 Y 10 M 15 D Return Phone Number: [phone] (Primary), [phone] (Secondary) Address: [place] [address] Client [facility] Day - Client Client Site [facility] - Day Physician Tillman Abide- MD Contact Type Call Who Is Calling Patient / Member / Family / Caregiver Call Type Triage / Clinical Caller Name Trystin Terhune Relationship To Patient Daughter Return Phone Number [phone] (Primary) Chief Complaint Leg Pain Reason for Call Symptomatic / Request for Health Information Initial Comment Caller states her father is very lethargic. He has pain in his legs. Translation No Nurse Assessment Nurse: Yetta Barre, RN, Miranda Date/Time (Eastern Time): 06/06/2020 12:33:47 PM Confirm and document reason for call. If symptomatic, describe symptoms. ---Spoke with patient's son, he states his father has been c/o being very tired the last 3 days. Yesterday, he had been outside using the leaf blower and became very weak. He needed assistance getting to a chair. Today, he fell down after tripping over his shoes. His home aid that comes in a couple of days a week told him that the pt has been falling recently. He is also c/o leg pain off and on. However that has been fading the last 3 months. Has the patient had close contact with a person known or suspected to have the novel coronavirus illness OR traveled / lives in area with major community spread (including international travel) in the last 14 days from the onset of symptoms? * If Asymptomatic, screen for exposure and travel within the last 14 days. ---No Does the patient have any new or worsening symptoms? ---Yes Will a triage be completed? ---Yes Related visit to physician within the last 2 weeks? ---No Does the PT have any  chronic conditions? (i.e. diabetes, asthma, this includes High risk factors for pregnancy, etc.) ---Unknown Is this a behavioral health or substance abuse call? ---No Guidelines Guideline Title Affirmed Question Affirmed Notes Nurse Date/Time (Eastern Time) Weakness (Generalized) and Fatigue [1] MODERATE weakness (i.e., interferes with work, school, normal activities) AND Yetta Barre, RN, Miranda 06/06/2020 12:41:49 PM PLEASE NOTE: All timestamps contained within this report are represented as [place] Standard Time. CONFIDENTIALTY NOTICE: This fax transmission is intended only for the addressee. It contains information that is legally privileged, confidential or otherwise protected from use or disclosure. If you are not the intended recipient, you are strictly prohibited from reviewing, disclosing, copying using or disseminating any of this information or taking any action in reliance on or regarding this information. If you have received this fax in error, please notify us immediately by telephone so that we can arrange for its return to [place]. Phone: [phone], Toll-Free: [phone], Fax: [phone] Page: 2 of 2 Call Id: [id number] Guidelines Guideline Title Affirmed Question Affirmed Notes Nurse Date/Time [name] Time) [2] cause unknown (Exceptions: weakness with acute minor illness, or weakness from poor fluid intake) Disp. Time [name] Time) Disposition Final User 06/06/2020 12:32:16 PM Attempt made - message left Yetta Barre RN, Miranda 06/06/2020 12:43:40 PM See HCP within 4 Hours (or PCP triage) Yes Yetta Barre, RN, Miranda Caller Disagree/Comply Comply Caller Understands Yes PreDisposition Call Doctor Care Advice Given Per Guideline SEE HCP WITHIN 4 HOURS (OR PCP TRIAGE): * IF OFFICE WILL BE OPEN: You need to be seen within the next 3 or 4 hours. Call your doctor (or NP/PA) now or as soon as the office opens. * OFFICE: If patient  sounds stable and not seriously ill, consult PCP (or  follow your office policy) to see if patient can be seen NOW in office. CALL BACK IF: * You become worse. CARE ADVICE given per Weakness and Fatigue (Adult) guideline. Referrals REFERRED TO PCP OFFICE

## 2020-06-06 NOTE — Telephone Encounter (Signed)
Appointment made with Dr. Alphonsus Sias for tomorrow at 12 noon.  Patient's son made aware and he will continue to push fluids in the interim.

## 2020-06-06 NOTE — Telephone Encounter (Signed)
Okay to add at noon--but then I can't add anything else

## 2020-06-06 NOTE — Telephone Encounter (Signed)
Sounds like he needs to be evaluated Certainly should try to rehydrate right away---since it is so hot out and he was working outside

## 2020-06-06 NOTE — Telephone Encounter (Signed)
Matthew Howard and I discussed this pt and [name] wants to know if can be added on at end of morning for your schedule on 06/07/20. [name] has already discussed with pts son the importance of rehydrating pt. Please advise.

## 2020-06-06 NOTE — Telephone Encounter (Signed)
Daughter in law, Deb calls in to report concern for patient.   They have been in for the past week or 2 with him and noticed some changes that they are concerned with and want to set up an appt with Dr. Alphonsus Sias.  Symptoms are as follows:  1.  Increased exhaustion and fatigue 2.  Vivid dreams 3. B leg pain worse in the ams 4. Still has not had his hernia surgery (inguinal/scrotal hernia) states not sure if urinating normally. 5.  Increased confusion and "dementia sxs" 6.  Larey Seat this am They deny any other acute symptoms regarding CP, SOB, temp, resp/sinus GI/GU problems that they are aware of.   Further assessment of symptoms reveal more urgent concern:  1.  Acutely worsening confusion and fatigue 2.  Acutely escalating irritability and agitation 3.  Fall this am, denies any difficulty ambulating but does have a laceration to arm. 4.  Dreams are becoming more vivid and agitating to patient in recent days  Given symptoms, I am concerned for possible infection and/or dehydration.  Family is very concerned.  I recommend the ER for further eval but will review with the covering provider, Nicki Reaper, NP for treatment advice.

## 2020-06-06 NOTE — Telephone Encounter (Signed)
Spoke with patient's son who is on [place].  Arm laceration is controlled with no active bleeding through bandage and should not require stitches.   They would prefer to have patient see PCP if he can work them in tomorrow.  I will forward this to Dr. Alphonsus Sias and see if we can add him on at the end of the morning and call patient's son back with outcome.   Son is aware; however, that if patient worsens then they will call EMS in the interim and go on to the ER.  Currently, his father is stable, calm watching TV after just finishing lunch and drinking fluids. He denies any pain at this point and is able to ambulate ad lib with his cane.

## 2020-06-07 ENCOUNTER — Other Ambulatory Visit: Payer: Self-pay

## 2020-06-07 ENCOUNTER — Encounter: Payer: Self-pay | Admitting: Internal Medicine

## 2020-06-07 ENCOUNTER — Ambulatory Visit: Payer: Medicare Other | Admitting: Internal Medicine

## 2020-06-07 VITALS — BP 112/66 | HR 71 | Temp 98.2°F | Ht 71.0 in | Wt 168.4 lb

## 2020-06-07 DIAGNOSIS — T148XXA Other injury of unspecified body region, initial encounter: Secondary | ICD-10-CM | POA: Insufficient documentation

## 2020-06-07 DIAGNOSIS — I4891 Unspecified atrial fibrillation: Secondary | ICD-10-CM | POA: Diagnosis not present

## 2020-06-07 DIAGNOSIS — N401 Enlarged prostate with lower urinary tract symptoms: Secondary | ICD-10-CM | POA: Diagnosis not present

## 2020-06-07 DIAGNOSIS — R5383 Other fatigue: Secondary | ICD-10-CM | POA: Diagnosis not present

## 2020-06-07 DIAGNOSIS — R3915 Urgency of urination: Secondary | ICD-10-CM

## 2020-06-07 LAB — T4, FREE: Free T4: 1.02 ng/dL (ref 0.60–1.60)

## 2020-06-07 LAB — COMPREHENSIVE METABOLIC PANEL
ALT: 17 U/L (ref 0–53)
AST: 23 U/L (ref 0–37)
Albumin: 3.8 g/dL (ref 3.5–5.2)
Alkaline Phosphatase: 70 U/L (ref 39–117)
BUN: 28 mg/dL — ABNORMAL HIGH (ref 6–23)
CO2: 28 mEq/L (ref 19–32)
Calcium: 9 mg/dL (ref 8.4–10.5)
Chloride: 103 mEq/L (ref 96–112)
Creatinine, Ser: 1.13 mg/dL (ref 0.40–1.50)
GFR: 59.81 mL/min — ABNORMAL LOW (ref >60.00)
Glucose, Bld: 107 mg/dL — ABNORMAL HIGH (ref 70–99)
Potassium: 5 mEq/L (ref 3.5–5.1)
Sodium: 140 mEq/L (ref 135–145)
Total Bilirubin: 0.9 mg/dL (ref 0.2–1.2)
Total Protein: 6.6 g/dL (ref 6.0–8.3)

## 2020-06-07 LAB — CBC
HCT: 32.3 % — ABNORMAL LOW (ref 39.0–52.0)
Hemoglobin: 10.5 g/dL — ABNORMAL LOW (ref 13.0–17.0)
MCHC: 32.6 g/dL (ref 30.0–36.0)
MCV: 100.7 fl — ABNORMAL HIGH (ref 78.0–100.0)
Platelets: 164 10*3/uL (ref 150.0–400.0)
RBC: 3.21 Mil/uL — ABNORMAL LOW (ref 4.22–5.81)
RDW: 16.7 % — ABNORMAL HIGH (ref 11.5–15.5)
WBC: 9.6 10*3/uL (ref 4.0–10.5)

## 2020-06-07 LAB — VITAMIN B12: Vitamin B-12: 304 pg/mL (ref 211–911)

## 2020-06-07 NOTE — Assessment & Plan Note (Signed)
On back on right elbow No signs of infection Should heal without problems

## 2020-06-07 NOTE — Progress Notes (Signed)
Subjective:    Patient ID: Matthew Howard, male    DOB: [birth year], 86 y.o.   MRN: [id number]  HPI Here due to a recent fall and weakness With son [name] This visit occurred during the SARS-CoV-2 public health emergency.  Safety protocols were in place, including screening questions prior to the visit, additional usage of staff PPE, and extensive cleaning of exam room while observing appropriate contact time as indicated for disinfecting solutions.   Son came in 4 days ago They noted him tired---may have been due to being out in the heat (was helping yard work with [employer] blower) Got tired and had to take a break fairly quickly Was tired the day before--just going out to dinner (but still ate well) Now the tiredness seems better  Larey Seat while getting dressed yesterday morning Stepped on shoes and lost balane No injury--just a skin break on right elbow/arm Slight scrape on back also Got up on his own  No chest pain  No SOB No dizziness or syncope Slight swelling in right foot---not new  Current Outpatient Medications on File Prior to Visit  Medication Sig Dispense Refill  . aspirin 81 MG EC tablet Take 81 mg by mouth daily.     . finasteride (PROSCAR) 5 MG tablet TAKE 1 TABLET BY MOUTH DAILY 90 tablet 3  . Tamsulosin HCl (FLOMAX) 0.4 MG CAPS Take 1 capsule (0.4 mg total) by mouth daily. 90 capsule 1   No current facility-administered medications on file prior to visit.    No Known Allergies  Past Medical History:  Diagnosis Date  . Advance directive discussed with patient 07/15/2015  . Atrial fibrillation (HCC) 01/23/2011  . Atrial flutter (HCC)    not well documented  . Benign prostatic hyperplasia (BPH) with urinary urgency 09/11/2007   Qualifier: Diagnosis of  By: Alphonsus Sias MD, Ronnette Hila   . Benign prostatic hypertrophy   . Gout   . Gout 09/11/2007   Qualifier: Diagnosis of  By: Alphonsus Sias MD, Ronnette Hila   . Hyperlipidemia   . Left inguinal hernia 03/29/2020  . Mobitz type  II atrioventricular block 01/25/2014  . Pacemaker 04/27/2014  . Pedal edema 09/10/2016  . Routine general medical examination at a health care facility 07/01/2013  . Scrotal right inuinal hernia 08/13/2019  . Symptomatic bradycardia    s/p dual chamber PPM implantation April 2015 [facility])    Past Surgical History:  Procedure Laterality Date  . CATARACT EXTRACTION    . PACEMAKER INSERTION  01-25-2014   STJ Assurity dual chamber pacemaker implanted by Dr [name] for symptomatic bradycardia  . PERMANENT PACEMAKER INSERTION N/A 01/25/2014   Procedure: PERMANENT PACEMAKER INSERTION;  Surgeon: Marinus Maw, MD;  Location: [facility] CATH LAB;  Service: Cardiovascular;  Laterality: N/A;  . TONSILLECTOMY      Family History  Problem Relation Age of Onset  . Cancer Mother   . Throat cancer Father     Social History   Socioeconomic History  . Marital status: Widowed    Spouse name: Dylann Gallier  . Number of children: 4  . Years of education: Not on file  . Highest education level: Not on file  Occupational History  . Occupation: retired    Comment: [employer]  Tobacco Use  . Smoking status: Never Smoker  . Smokeless tobacco: Never Used  Vaping Use  . Vaping Use: Never used  Substance and Sexual Activity  . Alcohol use: Yes    Comment: rare etoh use  .  Drug use: No  . Sexual activity: Not on file  Other Topics Concern  . Not on file  Social History Narrative   Widowed 4/15   No living will   No health care power of attorney--would want daughter [name] though   He feels he is ready for DNR---form done 08/13/19 (but he isn't sure he will put it up)   No tube feeds if cognitively unaware   Social Determinants of Health   Financial Resource Strain:   . Difficulty of Paying Living Expenses: Not on file  Food Insecurity:   . Worried About Programme researcher, broadcasting/film/video in the Last Year: Not on file  . Ran Out of Food in the Last Year: Not on file  Transportation Needs:   . Lack of  Transportation (Medical): Not on file  . Lack of Transportation (Non-Medical): Not on file  Physical Activity:   . Days of Exercise per Week: Not on file  . Minutes of Exercise per Session: Not on file  Stress:   . Feeling of Stress : Not on file  Social Connections:   . Frequency of Communication with Friends and Family: Not on file  . Frequency of Social Gatherings with Friends and Family: Not on file  . Attends Religious Services: Not on file  . Active Member of Clubs or Organizations: Not on file  . Attends Banker Meetings: Not on file  . Marital Status: Not on file  Intimate Partner Violence:   . Fear of Current or Ex-Partner: Not on file  . Emotionally Abused: Not on file  . Physically Abused: Not on file  . Sexually Abused: Not on file   Review of Systems Eating pretty well Has lost 10# over the past year or so Sleeps well No sig anxiety or depression Is scheduled for hernia repair soon--now pushed back into October--no pain noted (but it is large and affects his voiding, etc)    Objective:   Physical Exam Constitutional:      Appearance: Normal appearance.  Cardiovascular:     Rate and Rhythm: Normal rate and regular rhythm.     Heart sounds: No murmur heard.  No gallop.      Comments: Faint pedal pulses Pulmonary:     Effort: Pulmonary effort is normal.     Breath sounds: Normal breath sounds. No wheezing or rales.  Abdominal:     Palpations: Abdomen is soft.     Tenderness: There is no abdominal tenderness.  Musculoskeletal:     Cervical back: Neck supple.     Comments: Full calves without pitting  Lymphadenopathy:     Cervical: No cervical adenopathy.  Skin:    Comments: Superficial skin breaks on back and right elbow  Neurological:     Mental Status: He is alert.  Psychiatric:        Mood and Affect: Mood normal.        Behavior: Behavior normal.            Assessment & Plan:

## 2020-06-07 NOTE — Assessment & Plan Note (Signed)
Main voiding problem is the hernia Is due to have this fixed Remains on dual therapy with finasteride and tamsulosin

## 2020-06-07 NOTE — Assessment & Plan Note (Signed)
Regular now with pacer

## 2020-06-07 NOTE — Assessment & Plan Note (Signed)
Vague but seems better Likely related to the heat Will check some labs though

## 2020-06-08 ENCOUNTER — Other Ambulatory Visit: Payer: Self-pay | Admitting: Internal Medicine

## 2020-06-08 DIAGNOSIS — D649 Anemia, unspecified: Secondary | ICD-10-CM

## 2020-06-15 ENCOUNTER — Ambulatory Visit: Payer: Medicare Other | Admitting: Internal Medicine

## 2020-06-15 ENCOUNTER — Telehealth: Payer: Self-pay | Admitting: Cardiology

## 2020-06-15 ENCOUNTER — Telehealth: Payer: Self-pay

## 2020-06-15 ENCOUNTER — Ambulatory Visit (INDEPENDENT_AMBULATORY_CARE_PROVIDER_SITE_OTHER)
Admission: RE | Admit: 2020-06-15 | Discharge: 2020-06-15 | Disposition: A | Discharge status: Home / Self Care | Payer: Medicare Other | Source: Ambulatory Visit | Attending: Internal Medicine | Admitting: Internal Medicine

## 2020-06-15 ENCOUNTER — Other Ambulatory Visit: Payer: Self-pay

## 2020-06-15 ENCOUNTER — Encounter: Payer: Self-pay | Admitting: Internal Medicine

## 2020-06-15 VITALS — BP 130/76 | HR 71 | Temp 96.9°F | Ht 71.0 in | Wt 184.0 lb

## 2020-06-15 DIAGNOSIS — R0602 Shortness of breath: Secondary | ICD-10-CM

## 2020-06-15 DIAGNOSIS — R350 Frequency of micturition: Secondary | ICD-10-CM | POA: Diagnosis not present

## 2020-06-15 DIAGNOSIS — R410 Disorientation, unspecified: Secondary | ICD-10-CM | POA: Insufficient documentation

## 2020-06-15 LAB — CBC
HCT: 32.5 % — ABNORMAL LOW (ref 39.0–52.0)
Hemoglobin: 10.3 g/dL — ABNORMAL LOW (ref 13.0–17.0)
MCHC: 31.9 g/dL (ref 30.0–36.0)
MCV: 99.7 fl (ref 78.0–100.0)
Platelets: 220 10*3/uL (ref 150.0–400.0)
RBC: 3.25 Mil/uL — ABNORMAL LOW (ref 4.22–5.81)
RDW: 16.9 % — ABNORMAL HIGH (ref 11.5–15.5)
WBC: 10 10*3/uL (ref 4.0–10.5)

## 2020-06-15 LAB — HEPATIC FUNCTION PANEL
ALT: 19 U/L (ref 0–53)
AST: 28 U/L (ref 0–37)
Albumin: 3.7 g/dL (ref 3.5–5.2)
Alkaline Phosphatase: 73 U/L (ref 39–117)
Bilirubin, Direct: 0.3 mg/dL (ref 0.0–0.3)
Total Bilirubin: 1 mg/dL (ref 0.2–1.2)
Total Protein: 6.5 g/dL (ref 6.0–8.3)

## 2020-06-15 LAB — RENAL FUNCTION PANEL
Albumin: 3.7 g/dL (ref 3.5–5.2)
BUN: 30 mg/dL — ABNORMAL HIGH (ref 6–23)
CO2: 30 mEq/L (ref 19–32)
Calcium: 8.8 mg/dL (ref 8.4–10.5)
Chloride: 104 mEq/L (ref 96–112)
Creatinine, Ser: 1.14 mg/dL (ref 0.40–1.50)
GFR: 59.2 mL/min — ABNORMAL LOW (ref >60.00)
Glucose, Bld: 121 mg/dL — ABNORMAL HIGH (ref 70–99)
Phosphorus: 3 mg/dL (ref 2.3–4.6)
Potassium: 5.1 mEq/L (ref 3.5–5.1)
Sodium: 142 mEq/L (ref 135–145)

## 2020-06-15 LAB — POC URINALSYSI DIPSTICK (AUTOMATED)
Bilirubin, UA: NEGATIVE
Blood, UA: NEGATIVE
Glucose, UA: NEGATIVE
Ketones, UA: NEGATIVE
Leukocytes, UA: NEGATIVE
Nitrite, UA: NEGATIVE
Protein, UA: POSITIVE — AB
Spec Grav, UA: >=1.03 — AB (ref 1.010–1.025)
Urobilinogen, UA: 1 E.U./dL
pH, UA: 5.5 (ref 5.0–8.0)

## 2020-06-15 LAB — T4, FREE: Free T4: 1.02 ng/dL (ref 0.60–1.60)

## 2020-06-15 LAB — SEDIMENTATION RATE: Sed Rate: 104 mm/hr — ABNORMAL HIGH (ref 0–20)

## 2020-06-15 LAB — BRAIN NATRIURETIC PEPTIDE: Pro B Natriuretic peptide (BNP): 431 pg/mL — ABNORMAL HIGH (ref 0.0–100.0)

## 2020-06-15 LAB — VITAMIN B12: Vitamin B-12: 347 pg/mL (ref 211–911)

## 2020-06-15 MED ORDER — FUROSEMIDE 20 MG PO TABS: 20.0000 mg | ORAL_TABLET | Freq: Every day | ORAL | 0 refills | Status: DC
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
## 2020-06-15 NOTE — Assessment & Plan Note (Addendum)
Sudden at rest Stable DOE otherwise No evidence of CHF but will check CXR and labs  CXR shows small pulmonary effusions and some pulmonary vascular congestion Will start low dose furosemide---though I am concerned he isn't drinking enough See back next week
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
## 2020-06-15 NOTE — Addendum Note (Signed)
Addended by: Tillman Abide I on: 06/15/2020 03:12 PM   Modules accepted: Level of Service

## 2020-06-15 NOTE — Telephone Encounter (Signed)
error 

## 2020-06-15 NOTE — Assessment & Plan Note (Signed)
Probably related to the hernia Doesn't seem to have urinary retention Urinalysis shows concentration--probably needs to drink more
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
## 2020-06-15 NOTE — Telephone Encounter (Signed)
I spoke with patient's daughter. She reports patient has had recent onset of dementia  His balance is off and he has fallen a few times.  Also with increased anxiety. Has trouble remembering things and times where he thinks his dreams are real.  This is new since he saw Dr [name] on 8/23. Last office note on 8/23 indicates patient has chronic short of breath on exertion. Daughter reports patient has had increasing shortness of breath since this visit.  Shortness of breath is usually with exertion but yesterday was at rest also.  He does not weigh daily but daughter thinks he has lost weight recently.  No increased salt intake.  Has not eaten take out food.  No swelling in feet or ankles.  Daughter states legs are hard.  She does not know how to send device transmission.  Will ask device department to contact her to help with this. I asked daughter to contact PCP regarding confusion.  She reports they have appointment today at 1:00.

## 2020-06-15 NOTE — Telephone Encounter (Signed)
Sounds like delirium Need to check for UTI---will add on today

## 2020-06-15 NOTE — Telephone Encounter (Signed)
Walked patient wife through how to send a transmission and the wife wants someone to call her back asap about what they saw. Please call her back at [phone] because he has another doctors appointment and they may not be home

## 2020-06-15 NOTE — Telephone Encounter (Signed)
Left message to call office

## 2020-06-15 NOTE — Telephone Encounter (Signed)
Transmission reviewed. Normal PPM function. Known persistent AF, pt declines OAC per Dr. Lubertha Basque note from 05/30/20. V rate well controlled. No VT/VF episodes. Presenting rhythm AF/VP 70bpm w/ likely PVC couplet. Lead trends stable. VP 93%.   Spoke with pt, who gave permission to speak with Carlyle Basques, caregiver and family member. Reassured Gavin Pound regarding normal PPM function, no acute rhythm changes. She reports that pt has been saying that he cannot catch his breath. Reports SOB occurring both with exertion (chronic), and intermittently at rest. She reports he is very anxious about this at times, which then seems to worsen his SOB. Family's biggest concern is pt's confusion, which has gotten worse in the past few days. Pt has f/u with PCP this afternoon. Advised to keep f/u with PCP. Requested Gavin Pound call back to schedule appointment if PCP recommends f/u with cardiologist. ED precautions reviewed for acutely worsening symptoms. Deborah verbalizes agreement with plan and denies additional questions at this time.
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
## 2020-06-16 ENCOUNTER — Encounter: Payer: Self-pay | Admitting: Internal Medicine

## 2020-06-16 ENCOUNTER — Telehealth: Payer: Self-pay | Admitting: Internal Medicine

## 2020-06-16 ENCOUNTER — Ambulatory Visit
Admission: RE | Admit: 2020-06-16 | Discharge: 2020-06-16 | Disposition: A | Discharge status: Home / Self Care | Payer: Medicare Other | Source: Ambulatory Visit | Attending: Internal Medicine | Admitting: Internal Medicine

## 2020-06-16 DIAGNOSIS — R410 Disorientation, unspecified: Secondary | ICD-10-CM

## 2020-06-16 NOTE — Telephone Encounter (Signed)
Spoke to daughter. Advised her what was discussed yesterday that the lack of sleep could be causing the hallucinations and and delusions. Medication is not needed right now. Getting him sleep is what is need to see if these things get better.

## 2020-06-16 NOTE — Telephone Encounter (Signed)
That is exactly what I told the DIL who was here. Adding medications on top of his confusion could be dangerous---so I recommended stopping all caffeine and avoiding TV and news and other stimulating things for a while to see if that helps

## 2020-06-16 NOTE — Telephone Encounter (Signed)
Prior Versions: 1. [name], MD (Physician) at 06/15/2020 1:36 PM - Written    Confusion Delusions ---- multiple different things (including asking about where his pants zippers are to let out the water) Did hit head in fall--but no focal weakness Will check labs again Check MRI or CT scan---unlikely SDH but ?stroke Could be related to sleep deprivation  Will check CT scan first Consider neurology also (and MRI)    Assessment & Plan Note by [name], MD at 06/15/2020 1:36 PM Author: [name], MD Author Type: Physician Filed: 06/15/2020 2:04 PM  Note Status: Carlisle Cater: Cosign Not Required Encounter Date: 06/15/2020  Problem: SOB (shortness of breath)  Editor: [name], MD (Physician)      Prior Versions: 1. [name], MD (Physician) at 06/15/2020 1:36 PM - Written    Sudden at rest Stable DOE otherwise No evidence of CHF but will check CXR and labs  CXR shows small pulmonary effusions and some pulmonary vascular congestion Will start low dose furosemide---though I am concerned he isn't drinking enough See back next week     The above is from Dr Karle Starch assessment/findings on 06/15/2020.

## 2020-06-16 NOTE — Telephone Encounter (Signed)
I called patient's daughter,Robin, to schedule follow up appointment with Dr. Alphonsus Sias. [name] said patient hasn't been sleeping and she wants to know what she can give to patient to help him sleep. She's wondering if the sleep deprivation could have something to do with his hallucinations. Patient uses [facility].

## 2020-06-17 ENCOUNTER — Telehealth: Payer: Self-pay | Admitting: Cardiology

## 2020-06-17 NOTE — Telephone Encounter (Signed)
Pt c/o Shortness Of Breath: STAT if SOB developed within the last 24 hours or pt is noticeably SOB on the phone  1. Are you currently SOB (can you hear that pt is SOB on the phone)? no  2. How long have you been experiencing SOB? 1 week  3. Are you SOB when sitting or when up moving around? both  4. Are you currently experiencing any other symptoms? Confused and delusion   Patient's wife states the patient has been having labored breathing. She states he saw his PCP and had blood work done that suggested it could be heart related. She also states the patient has been delusional and his PCP told them he has delirium. She states he has not slept in days due to the SOB and he also had a chest x- ray that showed water in his lungs. She states to call [phone] or [phone]

## 2020-06-17 NOTE — Telephone Encounter (Signed)
Gavin Pound called and said pt is having difficulty breathing at rest. He keeps having to take deep breaths. He hasn't slept for days now. He is disoriented and confused. She wants to know if Dr. Alphonsus Sias will send patient to cardiologist today because his symptoms are not better. She said she is going to end up at the ER in which she doesn't want to go because he will be around covid patients but she feels she has no choice. She would like a call back as soon as possible.

## 2020-06-17 NOTE — Telephone Encounter (Signed)
Reviewed with Dr Eden Emms (DOD) who recommends patient see PCP or go to ED. I spoke with Gavin Pound and gave her this information.

## 2020-06-17 NOTE — Telephone Encounter (Signed)
PC with DIL He is still very delusional Now with labored breathing even at rest Still has small voids all the time---due to the hernia  Advised her to try 40mg  of the furosemide now and repeat it in 4 hours Will need to go to the ER if the breathing is not better If he improves, continue the 40mg  daily  Okay to try melatonin 1-3 mg at bedtime

## 2020-06-17 NOTE — Telephone Encounter (Signed)
I spoke with Gavin Pound. She reports patient saw PCP earlier this week for confusion and shortness of breath.  She reports lab work showed he may have CHF. Also had chest X ray done  He was started on lasix 20 mg daily. She thinks patient may have had increased urination but it is hard to tell.  Has trouble urinating at times but also goes to the bathroom frequently.  She reports he has not slept for days and is delusional. Talking about "connections" and "water and zippers in his pants" .  States PCP felt he may be dehydrated. Gavin Pound reports patient has not improved since seen by PCP and she feels he is worsening.  Gasping for breath at times.   I advised her patient needs to go to ED due to worsening of symptoms. They have also contacted PCP.  Deborah requests patient be seen by cardiologist today due to possible CHF and he also has a pacemaker. She is concerned taking him to ED would worsen his confusion.  Pacemaker was remotely checked earlier this week -see 9/8 phone note for pacemaker reports. Will review with DOD

## 2020-06-20 ENCOUNTER — Other Ambulatory Visit (HOSPITAL_COMMUNITY): Payer: Medicare Other

## 2020-06-21 ENCOUNTER — Ambulatory Visit: Payer: Medicare Other | Admitting: Internal Medicine

## 2020-06-21 ENCOUNTER — Other Ambulatory Visit: Payer: Self-pay

## 2020-06-21 ENCOUNTER — Encounter: Payer: Self-pay | Admitting: Internal Medicine

## 2020-06-21 ENCOUNTER — Telehealth: Payer: Self-pay | Admitting: Internal Medicine

## 2020-06-21 VITALS — BP 126/70 | HR 120 | Temp 96.8°F | Ht 71.0 in | Wt 171.0 lb

## 2020-06-21 DIAGNOSIS — N401 Enlarged prostate with lower urinary tract symptoms: Secondary | ICD-10-CM | POA: Diagnosis not present

## 2020-06-21 DIAGNOSIS — I5032 Chronic diastolic (congestive) heart failure: Secondary | ICD-10-CM

## 2020-06-21 DIAGNOSIS — R350 Frequency of micturition: Secondary | ICD-10-CM | POA: Diagnosis not present

## 2020-06-21 DIAGNOSIS — F0150 Vascular dementia without behavioral disturbance: Secondary | ICD-10-CM

## 2020-06-21 DIAGNOSIS — R3915 Urgency of urination: Secondary | ICD-10-CM

## 2020-06-21 DIAGNOSIS — I480 Paroxysmal atrial fibrillation: Secondary | ICD-10-CM

## 2020-06-21 LAB — RENAL FUNCTION PANEL
Albumin: 3.9 g/dL (ref 3.5–5.2)
BUN: 26 mg/dL — ABNORMAL HIGH (ref 6–23)
CO2: 30 mEq/L (ref 19–32)
Calcium: 9.3 mg/dL (ref 8.4–10.5)
Chloride: 99 mEq/L (ref 96–112)
Creatinine, Ser: 1.24 mg/dL (ref 0.40–1.50)
GFR: 53.73 mL/min — ABNORMAL LOW (ref >60.00)
Glucose, Bld: 92 mg/dL (ref 70–99)
Phosphorus: 4 mg/dL (ref 2.3–4.6)
Potassium: 4.2 mEq/L (ref 3.5–5.1)
Sodium: 138 mEq/L (ref 135–145)

## 2020-06-21 LAB — BRAIN NATRIURETIC PEPTIDE: Pro B Natriuretic peptide (BNP): 213 pg/mL — ABNORMAL HIGH (ref 0.0–100.0)

## 2020-06-21 MED ORDER — FUROSEMIDE 40 MG PO TABS: 40.0000 mg | ORAL_TABLET | Freq: Every day | ORAL | 3 refills | Status: DC

## 2020-06-21 NOTE — Telephone Encounter (Signed)
Please let her know that I sent a new prescription for the 40mg  furosemide. They can switch to this and just use 1 daily

## 2020-06-21 NOTE — Assessment & Plan Note (Signed)
No longer with apparent delirium Clear cognitive and functional decline Family now arranging for 24 hour care

## 2020-06-21 NOTE — Telephone Encounter (Signed)
Patient's daughter in law came into office requesting refill on furosemide (LASIX) 20 MG tablet As she says PCP has increased to 40mg . Only has 3-4 days left of 20, as they have been giving 2 instead. Please advise.

## 2020-06-21 NOTE — Telephone Encounter (Signed)
Tried to call Gavin Pound at pt's home number and there was no answer. Left message on vm for pt's daughter, [name], to let her know about the change.

## 2020-06-21 NOTE — Assessment & Plan Note (Signed)
Urinalysis is negative Part of hernia/BPH/cognition

## 2020-06-21 NOTE — Progress Notes (Signed)
Subjective:    Patient ID: Matthew Howard, male    DOB: [birth year], 86 y.o.   MRN: [id number]  HPI Here for follow up from last week--new diagnosis of CHF and mental status changes With DIL again This visit occurred during the SARS-CoV-2 public health emergency.  Safety protocols were in place, including screening questions prior to the visit, additional usage of staff PPE, and extensive cleaning of exam room while observing appropriate contact time as indicated for disinfecting solutions.   DIL and son are still staying with him Daughter also here over weekend Does his own ADLs--but needs help with clothes Trouble with balance---needs help to prevent falling Family has taken over instrumental ADLs Trying to arrange 24 hour care  Breathing is much better No chest pain No dizziness  No edema  Still confused but more alert Has gotten day/night confusion---more alert at 8PM  Current Outpatient Medications on File Prior to Visit  Medication Sig Dispense Refill  . aspirin 81 MG EC tablet Take 81 mg by mouth daily.     . finasteride (PROSCAR) 5 MG tablet TAKE 1 TABLET BY MOUTH DAILY 90 tablet 3  . furosemide (LASIX) 20 MG tablet Take 1 tablet (20 mg total) by mouth daily. (Patient taking differently: Take 40 mg by mouth daily. ) 30 tablet 0  . Tamsulosin HCl (FLOMAX) 0.4 MG CAPS Take 1 capsule (0.4 mg total) by mouth daily. 90 capsule 1   No current facility-administered medications on file prior to visit.    No Known Allergies  Past Medical History:  Diagnosis Date  . Advance directive discussed with patient 07/15/2015  . Atrial fibrillation (HCC) 01/23/2011  . Atrial flutter (HCC)    not well documented  . Benign prostatic hyperplasia (BPH) with urinary urgency 09/11/2007   Qualifier: Diagnosis of  By: Alphonsus Sias MD, Ronnette Hila   . Benign prostatic hypertrophy   . Gout   . Gout 09/11/2007   Qualifier: Diagnosis of  By: Alphonsus Sias MD, Ronnette Hila   . Hyperlipidemia   . Left  inguinal hernia 03/29/2020  . Mobitz type II atrioventricular block 01/25/2014  . Pacemaker 04/27/2014  . Pedal edema 09/10/2016  . Routine general medical examination at a health care facility 07/01/2013  . Scrotal right inuinal hernia 08/13/2019  . Symptomatic bradycardia    s/p dual chamber PPM implantation April 2015 [facility])    Past Surgical History:  Procedure Laterality Date  . CATARACT EXTRACTION    . PACEMAKER INSERTION  01-25-2014   STJ Assurity dual chamber pacemaker implanted by Dr [name] for symptomatic bradycardia  . PERMANENT PACEMAKER INSERTION N/A 01/25/2014   Procedure: PERMANENT PACEMAKER INSERTION;  Surgeon: Marinus Maw, MD;  Location: [facility] CATH LAB;  Service: Cardiovascular;  Laterality: N/A;  . TONSILLECTOMY      Family History  Problem Relation Age of Onset  . Cancer Mother   . Throat cancer Father     Social History   Socioeconomic History  . Marital status: Widowed    Spouse name: Marvel Mcphillips  . Number of children: 4  . Years of education: Not on file  . Highest education level: Not on file  Occupational History  . Occupation: retired    Comment: [employer]  Tobacco Use  . Smoking status: Never Smoker  . Smokeless tobacco: Never Used  Vaping Use  . Vaping Use: Never used  Substance and Sexual Activity  . Alcohol use: Yes    Comment: rare etoh use  .  Drug use: No  . Sexual activity: Not on file  Other Topics Concern  . Not on file  Social History Narrative   Widowed 4/15   No living will   No health care power of attorney--would want daughter [name] though   He feels he is ready for DNR---form done 08/13/19 (but he isn't sure he will put it up)   No tube feeds if cognitively unaware   Social Determinants of Health   Financial Resource Strain:   . Difficulty of Paying Living Expenses: Not on file  Food Insecurity:   . Worried About Programme researcher, broadcasting/film/video in the Last Year: Not on file  . Ran Out of Food in the Last Year: Not on file    Transportation Needs:   . Lack of Transportation (Medical): Not on file  . Lack of Transportation (Non-Medical): Not on file  Physical Activity:   . Days of Exercise per Week: Not on file  . Minutes of Exercise per Session: Not on file  Stress:   . Feeling of Stress : Not on file  Social Connections:   . Frequency of Communication with Friends and Family: Not on file  . Frequency of Social Gatherings with Friends and Family: Not on file  . Attends Religious Services: Not on file  . Active Member of Clubs or Organizations: Not on file  . Attends Banker Meetings: Not on file  . Marital Status: Not on file  Intimate Partner Violence:   . Fear of Current or Ex-Partner: Not on file  . Emotionally Abused: Not on file  . Physically Abused: Not on file  . Sexually Abused: Not on file   Review of Systems Doesn't seem to be remembering to eat if they don't prompt him Generally continent--occasional urine incontinece Still "obsessed with the bathroom" at night---will sit on toilet with no success Continues on the dual therapy    Objective:   Physical Exam Constitutional:      Appearance: Normal appearance.  Cardiovascular:     Rate and Rhythm: Normal rate and regular rhythm.     Heart sounds: No murmur heard.  No gallop.   Pulmonary:     Effort: Pulmonary effort is normal.     Breath sounds: Normal breath sounds. No wheezing or rales.  Musculoskeletal:     Cervical back: Neck supple.     Right lower leg: No edema.     Left lower leg: No edema.  Lymphadenopathy:     Cervical: No cervical adenopathy.  Neurological:     Mental Status: He is alert.  Psychiatric:     Comments: Alert now            Assessment & Plan:

## 2020-06-21 NOTE — Assessment & Plan Note (Signed)
Regular now ASA only

## 2020-06-21 NOTE — Assessment & Plan Note (Signed)
On tamsulosin and finasteride.

## 2020-06-21 NOTE — Telephone Encounter (Signed)
Matthew Howard daughter in law called wanting to get the name of home health agency that you recommended to her today  She keep saying [facility]??

## 2020-06-21 NOTE — Assessment & Plan Note (Signed)
New diagnosis Recent echo shows normal EF Now compensated by 20mg  of furosemide daily

## 2020-06-21 NOTE — Telephone Encounter (Signed)
Do we need to change the quantity?

## 2020-06-22 NOTE — Telephone Encounter (Signed)
Pt has been evaluated and treated by Dr Alphonsus Sias.

## 2020-06-22 NOTE — Telephone Encounter (Signed)
It is [place] that has a home health service

## 2020-06-22 NOTE — Telephone Encounter (Signed)
Left message on VM with info [facility] [place]

## 2020-06-27 ENCOUNTER — Encounter: Payer: Self-pay | Admitting: Neurology

## 2020-06-27 ENCOUNTER — Telehealth: Payer: Self-pay | Admitting: Internal Medicine

## 2020-06-27 DIAGNOSIS — F0150 Vascular dementia without behavioral disturbance: Secondary | ICD-10-CM

## 2020-06-27 NOTE — Telephone Encounter (Signed)
Spoke to pt's son. He said they got in with Dr Karel Jarvis in December. I told him that is about the going time frame. He is on a Chiropodist.

## 2020-06-27 NOTE — Telephone Encounter (Signed)
Pt's son says pt needs a referral to neurology.  They think he's possibly post stroke or something else going on such as dementia.  Please advise.  Requests c/b [phone]  Thank you!

## 2020-06-27 NOTE — Telephone Encounter (Signed)
Please let him know that I put in the referral---but I have already diagnosed him with dementia. It is a good idea to have the specialist evaluate him as well

## 2020-06-28 ENCOUNTER — Telehealth: Payer: Self-pay

## 2020-06-28 NOTE — Telephone Encounter (Signed)
Patient's daughter contacted the office and states that the soonest patient can be seen at neurology is 10/05/20. She states that if a new referral is sent over and is marked urgent - he can be seen much sooner. Dr. Alphonsus Sias, would you be willing to place a new urgent referral for patient to neurology? The daughter states that his health is declining, and she would like to see him sooner rather than later?

## 2020-06-28 NOTE — Telephone Encounter (Signed)
Why don't you check first with [facility] to see if they can get him in sooner. They don't let us choose that it could be either one (or I would)

## 2020-06-29 ENCOUNTER — Telehealth: Payer: Self-pay | Admitting: Internal Medicine

## 2020-06-29 NOTE — Telephone Encounter (Signed)
Please call [name] in ref to Neurology appt for pt.  Thank you!

## 2020-06-30 ENCOUNTER — Telehealth: Payer: Self-pay | Admitting: Internal Medicine

## 2020-06-30 ENCOUNTER — Telehealth: Payer: Self-pay | Admitting: *Deleted

## 2020-06-30 NOTE — Telephone Encounter (Signed)
Spoke with pt daughter-in-law Gavin Pound, aware of concerns with having a MRI.  She wants to now pursue Neurology at [facility]. I will change the referral that is in the system to [place] [facility].  Gavin Pound declined the visit with Dr Alphonsus Sias at this time - prefers to see Neurology if able to get in pretty quick.   See other Telephone encounter  Will send to Dr Alphonsus Sias as Lorain Childes

## 2020-06-30 NOTE — Telephone Encounter (Signed)
I would recommend adding a second dose of miralax daily and 2 senna S tabs as well. If he hasn't gone after 2 days of adding these, they can try milk of magnesia or 1/2 bottle magnesium citrate  Also, I don't think it is safe to do an MRI due to his pacemaker----but I am reasonably sure he has vascular dementia (and likely had an acute stroke when all this started a few weeks ago)

## 2020-06-30 NOTE — Telephone Encounter (Signed)
Okay I am fine with that

## 2020-06-30 NOTE — Telephone Encounter (Signed)
Not able to get him into neurology in the next few months. I am fairly sure he has vascular dementia Will set up MRI to make sure there are no other findings  Actually--he has pacemaker. Will not be able to do MRI

## 2020-06-30 NOTE — Telephone Encounter (Signed)
Deborah aware of recommendation for constipation treatment   Also aware of MRI - not being able to do this d/t pacemaker.   See other TE regarding MRI and Neuro referral - Gavin Pound now wants to proceed with referral to [facility] Neuro - referral activated and sent to [facility] to schedule asap appt.

## 2020-06-30 NOTE — Telephone Encounter (Signed)
Gavin Pound (daughter-n-law/ DPR signed)  left message at triage. Pt is severely constipated and he already has urinary issues, and the urologist said that if pt becomes constipated it will make his urinary issues worse. They had f/u with urologist last week and he has no UTI but he has starting saying he needs to go to the bathroom like 25x a day saying he has to urinate and he is only actually urinating 1/2 of thoes times and the other 1/2 he isn't doing anything so family is worried that his constipation issues have started to affect his urinary issues, and he also has a hernia. Gavin Pound said they are giving him miralax once daily, they are giving him prunes, one apple a day and also Rasin Brand, and nothing is helping. Pt is still eating and drinking normally but is only going to the bathroom maybe 2-3 times a week and when he goes it's a very small amount that he actually goes to the bathroom he may have only 3 small pieces of stool with be in the commode. DNL wants to know what they can give pt to help him have a BM and also keep him regular  [facility] [address]

## 2020-06-30 NOTE — Telephone Encounter (Signed)
Noted I am fine with trying to get him in at [facility]

## 2020-07-01 NOTE — Telephone Encounter (Signed)
noted 

## 2020-07-01 NOTE — Telephone Encounter (Signed)
Matthew Howard is aware of appt/date/time/location and is okay with this as scheduled.   FYI  Nothing further needed.

## 2020-07-01 NOTE — Telephone Encounter (Signed)
Appointment scheduled for Dr Sherryll Burger at [facility] on Monday 07/04/20 at 2:30pm - arrival by 2:15. Left detailed message for daughter-in-law Gavin Pound to make her aware of this appt date/time.   Dr Gerline [facility] P [phone] [address] [place] (beside [facility])

## 2020-07-04 ENCOUNTER — Telehealth: Payer: Self-pay | Admitting: Internal Medicine

## 2020-07-04 NOTE — Telephone Encounter (Signed)
Patient's daughter in law calling to find out if the patient's device is MRI compatible. She states his device is a Web designer 2[address] assurity cr. She states he will have a brain MRI.

## 2020-07-04 NOTE — Telephone Encounter (Signed)
Unfortunately pt device and leads are NOT listed as being MRI compatible.   Discussed with pt's daughter.   Casimiro Needle [address] [place], PA-C  07/04/2020 11:14 AM

## 2020-07-05 ENCOUNTER — Other Ambulatory Visit: Payer: Self-pay | Admitting: Neurology

## 2020-07-05 DIAGNOSIS — F0280 Dementia in other diseases classified elsewhere without behavioral disturbance: Secondary | ICD-10-CM

## 2020-07-05 DIAGNOSIS — K5641 Fecal impaction: Secondary | ICD-10-CM

## 2020-07-07 ENCOUNTER — Other Ambulatory Visit: Payer: Self-pay | Admitting: Internal Medicine

## 2020-07-07 ENCOUNTER — Telehealth: Payer: Self-pay | Admitting: Internal Medicine

## 2020-07-07 NOTE — Telephone Encounter (Signed)
Patient's daughter in law states the patient's neurologist prescribed Donepezil 5 mg and she would like to ensure that patient is eligible to take this medication due to heart condition. She states she would also like to clarify whether or not patient's PPM is MRI compatible. Please advise.

## 2020-07-07 NOTE — Telephone Encounter (Signed)
Patient's daughter in law called in and stated patient went to [facility] and received high dose flu shot on 9/27. Please add to chart.

## 2020-07-07 NOTE — Telephone Encounter (Signed)
I have updated the chart.  

## 2020-07-07 NOTE — Telephone Encounter (Addendum)
Yes donepezil can increase QTc, although it is a rare side effect. Pt does have some risk factors of prolonged QTc though due to advanced age and baseline QTc of 490-543msec on 2 most recent EKGs. Would be cautious with donepezil use. If he does start on therapy, would recommend rechecking an EKG a few weeks later.

## 2020-07-07 NOTE — Telephone Encounter (Signed)
Patient's daughter in-law read that donepezil can prolong QTC interval and is wondering if the patient is okay to take it. He has a pacemaker.  She is also wondering if his pacemaker is compatible for a brain MRI.  Advised that I would reach out to pur PharmD and device team and let her know.

## 2020-07-07 NOTE — Telephone Encounter (Signed)
Called patient to advise that Pacemaker is NOT compatible with MRI.   Spoke to patients caregiver "Matthew Howard", requested she have the patient call us back. States she will relay that information. Direct phone number provided to the Device Clinic.

## 2020-07-08 ENCOUNTER — Telehealth: Payer: Self-pay | Admitting: Internal Medicine

## 2020-07-08 NOTE — Telephone Encounter (Signed)
Follow up:      Patient returning a call back from this morning.

## 2020-07-08 NOTE — Telephone Encounter (Signed)
Device is not MRI compatible.  Per Dr. Minna Merritts side effect of donezepril is sinus node dysfunction.  Pt already has a pacemaker.  Per Dr. [name] ok for Pt to take donezepril.  No follow up EKG needed.  Sent information to family via Mychart.

## 2020-07-08 NOTE — Telephone Encounter (Signed)
Left message for patient's daughter in law to call back.  

## 2020-07-11 ENCOUNTER — Ambulatory Visit: Payer: Medicare Other

## 2020-07-11 NOTE — Telephone Encounter (Signed)
Spoke to daughter and advised of following per Dr. [name]:  Device is not MRI compatible.  Per Dr. Minna Merritts side effect of donezepril is sinus node dysfunction.  Pt already has a pacemaker.  Per Dr. [name] ok for Pt to take donezepril.  No follow up EKG needed.

## 2020-07-14 ENCOUNTER — Other Ambulatory Visit: Payer: Self-pay

## 2020-07-14 ENCOUNTER — Ambulatory Visit
Admission: RE | Admit: 2020-07-14 | Discharge: 2020-07-14 | Disposition: A | Discharge status: Home / Self Care | Payer: Medicare Other | Source: Ambulatory Visit | Attending: Neurology | Admitting: Neurology

## 2020-07-14 DIAGNOSIS — G309 Alzheimer's disease, unspecified: Secondary | ICD-10-CM | POA: Insufficient documentation

## 2020-07-14 DIAGNOSIS — F0150 Vascular dementia without behavioral disturbance: Secondary | ICD-10-CM | POA: Diagnosis present

## 2020-07-14 DIAGNOSIS — F0280 Dementia in other diseases classified elsewhere without behavioral disturbance: Secondary | ICD-10-CM | POA: Insufficient documentation

## 2020-07-25 ENCOUNTER — Ambulatory Visit (INDEPENDENT_AMBULATORY_CARE_PROVIDER_SITE_OTHER): Payer: Medicare Other

## 2020-07-25 DIAGNOSIS — I441 Atrioventricular block, second degree: Secondary | ICD-10-CM

## 2020-07-26 ENCOUNTER — Encounter: Payer: Self-pay | Admitting: Cardiology

## 2020-07-26 ENCOUNTER — Other Ambulatory Visit: Payer: Self-pay

## 2020-07-26 ENCOUNTER — Ambulatory Visit: Payer: Medicare Other | Admitting: Cardiology

## 2020-07-26 VITALS — BP 120/50 | HR 70 | Ht 71.0 in | Wt 178.0 lb

## 2020-07-26 DIAGNOSIS — I5032 Chronic diastolic (congestive) heart failure: Secondary | ICD-10-CM

## 2020-07-26 DIAGNOSIS — I441 Atrioventricular block, second degree: Secondary | ICD-10-CM

## 2020-07-26 DIAGNOSIS — Z950 Presence of cardiac pacemaker: Secondary | ICD-10-CM

## 2020-07-26 LAB — CUP PACEART REMOTE DEVICE CHECK
Battery Remaining Longevity: 88 mo
Battery Remaining Percentage: 80 %
Battery Voltage: 2.93 V
Brady Statistic AP VP Percent: 0 %
Brady Statistic AP VS Percent: 0 %
Brady Statistic AS VP Percent: 0 %
Brady Statistic AS VS Percent: 0 %
Brady Statistic RA Percent Paced: 0 %
Brady Statistic RV Percent Paced: 97 %
Date Time Interrogation Session: 20211018023347
Implantable Lead Implant Date: 20150420
Implantable Lead Implant Date: 20150420
Implantable Lead Location: 753859
Implantable Lead Location: 753860
Implantable Pulse Generator Implant Date: 20150420
Lead Channel Impedance Value: 440 Ohm
Lead Channel Impedance Value: 460 Ohm
Lead Channel Pacing Threshold Amplitude: 0.5 V
Lead Channel Pacing Threshold Amplitude: 1 V
Lead Channel Pacing Threshold Pulse Width: 0.4 ms
Lead Channel Pacing Threshold Pulse Width: 0.4 ms
Lead Channel Sensing Intrinsic Amplitude: 10.8 mV
Lead Channel Sensing Intrinsic Amplitude: 2.5 mV
Lead Channel Setting Pacing Amplitude: 2 V
Lead Channel Setting Pacing Amplitude: 2.5 V
Lead Channel Setting Pacing Pulse Width: 0.4 ms
Lead Channel Setting Sensing Sensitivity: 2 mV
Pulse Gen Model: 2240
Pulse Gen Serial Number: 3013717

## 2020-07-26 MED ORDER — FUROSEMIDE 40 MG PO TABS: 40.0000 mg | ORAL_TABLET | Freq: Two times a day (BID) | ORAL | 3 refills | Status: DC
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
## 2020-07-26 NOTE — Patient Instructions (Signed)
Medication Instructions:  Please increase Furosemide to 40 mg twice a day.  Continue all other medications as listed.  *If you need a refill on your cardiac medications before your next appointment, please call your pharmacy*  Please weigh daily and keep a log of your weights. Limit dietary sodium intake to no more than 2,000 mg a day.  Follow-Up: At [facility], you and your health needs are our priority.  As part of our continuing mission to provide you with exceptional heart care, we have created designated Provider Care Teams.  These Care Teams include your primary Cardiologist (physician) and Advanced Practice Providers (APPs -  Physician Assistants and Nurse Practitioners) who all work together to provide you with the care you need, when you need it.  We recommend signing up for the patient portal called "MyChart".  Sign up information is provided on this After Visit Summary.  MyChart is used to connect with patients for Virtual Visits (Telemedicine).  Patients are able to view lab/test results, encounter notes, upcoming appointments, etc.  Non-urgent messages can be sent to your provider as well.   To learn more about what you can do with MyChart, go to [email or web address].    Your next appointment:   6 month(s)  The format for your next appointment:   In Person  Provider:   You may see [name], MD or one of the following Advanced Practice Providers on your designated Care Team:    Norma Fredrickson, NP  Nada Boozer, NP  Georgie Chard, NP  Thank you for choosing [facility]!!

## 2020-07-29 ENCOUNTER — Other Ambulatory Visit (HOSPITAL_COMMUNITY): Payer: Medicare Other

## 2020-07-29 NOTE — Progress Notes (Signed)
Remote pacemaker transmission.   

## 2020-08-02 ENCOUNTER — Encounter (HOSPITAL_COMMUNITY): Admission: RE | Admit: 2020-08-02 | Payer: Medicare Other | Source: Ambulatory Visit

## 2020-08-02 ENCOUNTER — Other Ambulatory Visit (HOSPITAL_COMMUNITY): Payer: Medicare Other

## 2020-08-02 ENCOUNTER — Telehealth: Payer: Self-pay | Admitting: Cardiology

## 2020-08-02 NOTE — Telephone Encounter (Signed)
I am fine going back to 40 mg of Lasix once a day.  Continue to weigh daily.  If weight gain increases 2 to 3 pounds, please take an additional 40 mg of Lasix.  Thanks  [name], MD

## 2020-08-02 NOTE — Telephone Encounter (Signed)
Patient's daughter states patient is now down to his target weight of 171 lbs. She would like to know whether or not to decrease furosemide (LASIX) 40 MG tablet intake. Please advise.

## 2020-08-02 NOTE — Telephone Encounter (Signed)
Will have Dr Anne Fu to review for orders.  At pt's last office visit Furosemide was incresed to 40 mg BID d/t 7 lbs wt gain.  Wt now back to baseline

## 2020-08-02 NOTE — Telephone Encounter (Signed)
Spoke with daughter, [name], who is aware per Dr Anne Fu - OK to decrease to 40 mg daily, continue to wt daily and if wt goes up 2 to 3 lbs to take additional 40 mg.  She will c/b with any further questions or concerns.

## 2020-08-15 ENCOUNTER — Encounter: Payer: Self-pay | Admitting: Internal Medicine

## 2020-08-15 ENCOUNTER — Other Ambulatory Visit: Payer: Self-pay

## 2020-08-15 ENCOUNTER — Ambulatory Visit (INDEPENDENT_AMBULATORY_CARE_PROVIDER_SITE_OTHER): Payer: Medicare Other | Admitting: Internal Medicine

## 2020-08-15 VITALS — BP 120/48 | HR 72 | Temp 96.1°F | Resp 16 | Ht 69.75 in | Wt 178.0 lb

## 2020-08-15 DIAGNOSIS — K4090 Unilateral inguinal hernia, without obstruction or gangrene, not specified as recurrent: Secondary | ICD-10-CM

## 2020-08-15 DIAGNOSIS — Z0000 Encounter for general adult medical examination without abnormal findings: Secondary | ICD-10-CM | POA: Diagnosis not present

## 2020-08-15 DIAGNOSIS — I480 Paroxysmal atrial fibrillation: Secondary | ICD-10-CM | POA: Diagnosis not present

## 2020-08-15 DIAGNOSIS — F0150 Vascular dementia without behavioral disturbance: Secondary | ICD-10-CM | POA: Diagnosis not present

## 2020-08-15 DIAGNOSIS — N401 Enlarged prostate with lower urinary tract symptoms: Secondary | ICD-10-CM

## 2020-08-15 DIAGNOSIS — I5032 Chronic diastolic (congestive) heart failure: Secondary | ICD-10-CM

## 2020-08-15 DIAGNOSIS — Z7189 Other specified counseling: Secondary | ICD-10-CM

## 2020-08-15 DIAGNOSIS — R3915 Urgency of urination: Secondary | ICD-10-CM

## 2020-08-15 NOTE — Assessment & Plan Note (Signed)
I have personally reviewed the Medicare Annual Wellness questionnaire and have noted 1. The patient's medical and social history 2. Their use of alcohol, tobacco or illicit drugs 3. Their current medications and supplements 4. The patient's functional ability including ADL's, fall risks, home safety risks and hearing or visual             impairment. 5. Diet and physical activities 6. Evidence for depression or mood disorders  The patients weight, height, BMI and visual acuity have been recorded in the chart I have made referrals, counseling and provided education to the patient based review of the above and I have provided the pt with a written personalized care plan for preventive services.  I have provided you with a copy of your personalized plan for preventive services. Please take the time to review along with your updated medication list.  No cancer screening due to age Has had COVID booster and flu vaccine Will hold off on shingrix

## 2020-08-15 NOTE — Assessment & Plan Note (Signed)
Does okay with the dual therapy--finasteride and tamsulosin

## 2020-08-15 NOTE — Assessment & Plan Note (Signed)
Regular now Is on just the asa now

## 2020-08-15 NOTE — Progress Notes (Addendum)
Subjective:    Patient ID: Matthew Howard, male    DOB: 1918-11-13, 86 y.o.   MRN: [id number]  HPI Here with DIL for Medicare wellness visit and follow up of chronic health conditions This visit occurred during the SARS-CoV-2 public health emergency.  Safety protocols were in place, including screening questions prior to the visit, additional usage of staff PPE, and extensive cleaning of exam room while observing appropriate contact time as indicated for disinfecting solutions.   Reviewed advanced directives Reviewed other doctors---Dr Skains---cardiology, Dr Shah--neurology, Eye doctor? No alcohol or tobacco No exercise--other than walking around the house Vision is okay Hearing aides help Some falls--in injuries. Poor balance. Uses cane--has rollator Mild depressed mood---"I'm getting old" Enjoys reading newspaper, etc No hospitalizations or surgery this year  Still in his own home He didn't like paid caregivers---even one who lived in with him Does have night caregivers Son stays some of the time---DIL stays other times (and they overlap) in day Hasn't been alone Not driving DIL or daughter do the shopping---DIL cooks DIL helps with dressing and bathing---directions/stand by. Bathroom is independent--some urinary incontinence (mostly at night) Sleeping some better at night with melatonin. Mild delusions only (calls the aides "guards")  Finally got the swelling/breathing better with furosemide Cognition has improved with the better breathing No chest pain No palpitations No dizziness or syncope  Voids okay on meds Flow seems okay Nocturia x 2-4 generally  Hernia surgery on hold due to the CHF No pain but it "is in my way"  Bowels moving okay with miralax  Current Outpatient Medications on File Prior to Visit  Medication Sig Dispense Refill  . aspirin 81 MG EC tablet Take 81 mg by mouth daily.     . cholecalciferol (VITAMIN D3) 25 MCG (1000 UNIT) tablet Take  1,000 Units by mouth daily.    . cyanocobalamin 1000 MCG tablet Take 1,000 mcg by mouth daily.    Marland Kitchen donepezil (ARICEPT) 5 MG tablet Take 5 mg by mouth at bedtime.    . finasteride (PROSCAR) 5 MG tablet TAKE 1 TABLET BY MOUTH DAILY (Patient taking differently: Take 5 mg by mouth daily. ) 90 tablet 3  . furosemide (LASIX) 40 MG tablet Take 1 tablet (40 mg total) by mouth 2 (two) times daily. 180 tablet 3  . melatonin 5 MG TABS Take 5 mg by mouth at bedtime.    . polyethylene glycol (MIRALAX / GLYCOLAX) 17 g packet Take 17 g by mouth daily.    . Tamsulosin HCl (FLOMAX) 0.4 MG CAPS Take 1 capsule (0.4 mg total) by mouth daily. 90 capsule 1   No current facility-administered medications on file prior to visit.    No Known Allergies  Past Medical History:  Diagnosis Date  . Advance directive discussed with patient 07/15/2015  . Atrial fibrillation (HCC) 01/23/2011  . Atrial flutter (HCC)    not well documented  . Benign prostatic hyperplasia (BPH) with urinary urgency 09/11/2007   Qualifier: Diagnosis of  By: Alphonsus Sias MD, Ronnette Hila   . Benign prostatic hypertrophy   . Gout   . Gout 09/11/2007   Qualifier: Diagnosis of  By: Alphonsus Sias MD, Ronnette Hila   . Hyperlipidemia   . Left inguinal hernia 03/29/2020  . Mobitz type II atrioventricular block 01/25/2014  . Pacemaker 04/27/2014  . Pedal edema 09/10/2016  . Routine general medical examination at a health care facility 07/01/2013  . Scrotal right inuinal hernia 08/13/2019  . Symptomatic bradycardia    s/p  dual chamber PPM implantation April 2015 [facility] [employer])    Past Surgical History:  Procedure Laterality Date  . CATARACT EXTRACTION    . PACEMAKER INSERTION  01-25-2014   STJ Assurity dual chamber pacemaker implanted by Dr [name] for symptomatic bradycardia  . PERMANENT PACEMAKER INSERTION N/A 01/25/2014   Procedure: PERMANENT PACEMAKER INSERTION;  Surgeon: Marinus Maw, MD;  Location: [facility] CATH LAB;  Service: Cardiovascular;  Laterality: N/A;   . TONSILLECTOMY      Family History  Problem Relation Age of Onset  . Cancer Mother   . Throat cancer Father     Social History   Socioeconomic History  . Marital status: Widowed    Spouse name: [name]  . Number of children: 4  . Years of education: Not on file  . Highest education level: Not on file  Occupational History  . Occupation: retired    Comment: [employer]  Tobacco Use  . Smoking status: Never Smoker  . Smokeless tobacco: Never Used  Vaping Use  . Vaping Use: Never used  Substance and Sexual Activity  . Alcohol use: Yes    Comment: rare etoh use  . Drug use: No  . Sexual activity: Not on file  Other Topics Concern  . Not on file  Social History Narrative   Widowed 4/15   No living will   Health care power of attorney is son [name]. Daughter [name] is financial POA    He feels he is ready for DNR---form done 08/13/19 (but he isn't sure he will put it up)   No tube feeds if cognitively unaware   Social Determinants of Health   Financial Resource Strain:   . Difficulty of Paying Living Expenses: Not on file  Food Insecurity:   . Worried About Programme researcher, broadcasting/film/video in the Last Year: Not on file  . Ran Out of Food in the Last Year: Not on file  Transportation Needs:   . Lack of Transportation (Medical): Not on file  . Lack of Transportation (Non-Medical): Not on file  Physical Activity:   . Days of Exercise per Week: Not on file  . Minutes of Exercise per Session: Not on file  Stress:   . Feeling of Stress : Not on file  Social Connections:   . Frequency of Communication with Friends and Family: Not on file  . Frequency of Social Gatherings with Friends and Family: Not on file  . Attends Religious Services: Not on file  . Active Member of Clubs or Organizations: Not on file  . Attends Banker Meetings: Not on file  . Marital Status: Not on file  Intimate Partner Violence:   . Fear of Current or Ex-Partner: Not on file  .  Emotionally Abused: Not on file  . Physically Abused: Not on file  . Sexually Abused: Not on file   Review of Systems Noticed bleeding from left great toe this morning--has skin tear from the bed--not painful Appetite is okay Weight is monitored daily and fairly stable in the 170's Some sleep issues Wears seat belt Teeth fine--doesn't see dentist No heartburn or dysphagia No sig back or joint pains    Objective:   Physical Exam Constitutional:      Appearance: Normal appearance.  Cardiovascular:     Rate and Rhythm: Normal rate and regular rhythm.     Heart sounds: No murmur heard.  No gallop.      Comments: Faint pedal pulses Abdominal:  Palpations: Abdomen is soft.     Tenderness: There is no abdominal tenderness.  Musculoskeletal:     Cervical back: Neck supple.     Comments: Slight edema in ankles  Lymphadenopathy:     Cervical: No cervical adenopathy.  Skin:    Comments: Small open spot under left great toe--bleeding stopped (bandaid reapplied)  Neurological:     Mental Status: He is alert.     Comments: Mild confusion/memory issues  Psychiatric:        Mood and Affect: Mood normal.        Behavior: Behavior normal.            Assessment & Plan:

## 2020-08-15 NOTE — Assessment & Plan Note (Signed)
Large so unlikely to strangulate High risk--so holding off on surgery

## 2020-08-15 NOTE — Assessment & Plan Note (Signed)
Rewrote his DNR

## 2020-08-15 NOTE — Assessment & Plan Note (Signed)
Compensated now with bid furosemide 40mg  He seems to gain weight quickly with just daily ---so will continue bid

## 2020-08-15 NOTE — Assessment & Plan Note (Signed)
Neurologist feels it is mixed with Alzheimer's On the donepezil Has 24 hour care now---mixed family and paid caregivers

## 2020-08-15 NOTE — Progress Notes (Signed)
Hearing Screening   125Hz  250Hz  500Hz  1000Hz  2000Hz  3000Hz  4000Hz  6000Hz  8000Hz   Right ear:           Left ear:           Comments: Patient has bilateral hearing aides was unable to assess hearing.    Visual Acuity Screening   Right eye Left eye Both eyes  Without correction: 20/40 20/40 20/20   With correction:

## 2020-08-17 ENCOUNTER — Telehealth: Payer: Self-pay | Admitting: *Deleted

## 2020-08-17 NOTE — Telephone Encounter (Signed)
Spoke to DIL, [employer].

## 2020-08-17 NOTE — Telephone Encounter (Signed)
Gavin Pound (daughter-in-law on [place]) called back stating that she was the one that had left the message earlier about his hands cramping and craving sugar.

## 2020-08-17 NOTE — Telephone Encounter (Signed)
Last potassium was normal--but okay to try supplement. Some people also find magnesium (also over the counter) can help cramps

## 2020-08-17 NOTE — Telephone Encounter (Signed)
Message was left by a male (no name) The message left was that patient was recently seen. Message left was that patent is complaining of cramps in his hands and he is on Furosemide 80 mg daily. Message was left that they were wondering if patient should be on a vitamin supplement or potasium. Was advised that patient craves sugar and does have a good appetite.  Left message at number [phone] for a call back with the name of the person that had left this message.

## 2020-08-19 ENCOUNTER — Other Ambulatory Visit (HOSPITAL_COMMUNITY): Payer: Medicare Other

## 2020-08-23 ENCOUNTER — Encounter: Admission: RE | Payer: Self-pay | Source: Home / Self Care

## 2020-08-23 ENCOUNTER — Ambulatory Visit: Admission: RE | Admit: 2020-08-23 | Payer: Medicare Other | Source: Home / Self Care | Admitting: Surgery

## 2020-08-23 SURGERY — REPAIR, HERNIA, INGUINAL, LAPAROSCOPIC
Anesthesia: General

## 2020-09-05 ENCOUNTER — Telehealth: Payer: Self-pay

## 2020-09-05 ENCOUNTER — Emergency Department (HOSPITAL_COMMUNITY)
Admission: EM | Admit: 2020-09-05 | Discharge: 2020-09-05 | Discharge status: Absent without leave | Payer: Medicare Other

## 2020-09-05 ENCOUNTER — Telehealth: Payer: Self-pay | Admitting: Cardiology

## 2020-09-05 ENCOUNTER — Other Ambulatory Visit: Payer: Self-pay

## 2020-09-05 ENCOUNTER — Encounter (HOSPITAL_COMMUNITY): Payer: Self-pay | Admitting: Emergency Medicine

## 2020-09-05 ENCOUNTER — Emergency Department (HOSPITAL_COMMUNITY)
Admission: EM | Admit: 2020-09-05 | Discharge: 2020-09-05 | Disposition: A | Discharge status: Home / Self Care | Payer: Medicare Other | Attending: Emergency Medicine | Admitting: Emergency Medicine

## 2020-09-05 DIAGNOSIS — Z7982 Long term (current) use of aspirin: Secondary | ICD-10-CM | POA: Insufficient documentation

## 2020-09-05 DIAGNOSIS — F0150 Vascular dementia without behavioral disturbance: Secondary | ICD-10-CM | POA: Insufficient documentation

## 2020-09-05 DIAGNOSIS — Z79899 Other long term (current) drug therapy: Secondary | ICD-10-CM | POA: Insufficient documentation

## 2020-09-05 DIAGNOSIS — Z950 Presence of cardiac pacemaker: Secondary | ICD-10-CM | POA: Insufficient documentation

## 2020-09-05 DIAGNOSIS — K4090 Unilateral inguinal hernia, without obstruction or gangrene, not specified as recurrent: Secondary | ICD-10-CM | POA: Diagnosis not present

## 2020-09-05 DIAGNOSIS — I5033 Acute on chronic diastolic (congestive) heart failure: Secondary | ICD-10-CM | POA: Diagnosis not present

## 2020-09-05 DIAGNOSIS — M7989 Other specified soft tissue disorders: Secondary | ICD-10-CM | POA: Diagnosis present

## 2020-09-05 DIAGNOSIS — L03116 Cellulitis of left lower limb: Secondary | ICD-10-CM | POA: Insufficient documentation

## 2020-09-05 LAB — CBC WITH DIFFERENTIAL/PLATELET
Abs Immature Granulocytes: 0.07 10*3/uL (ref 0.00–0.07)
Basophils Absolute: 0 10*3/uL (ref 0.0–0.1)
Basophils Relative: 0 %
Eosinophils Absolute: 0.2 10*3/uL (ref 0.0–0.5)
Eosinophils Relative: 1 %
HCT: 33.2 % — ABNORMAL LOW (ref 39.0–52.0)
Hemoglobin: 9.8 g/dL — ABNORMAL LOW (ref 13.0–17.0)
Immature Granulocytes: 1 %
Lymphocytes Relative: 17 %
Lymphs Abs: 2 10*3/uL (ref 0.7–4.0)
MCH: 31.2 pg (ref 26.0–34.0)
MCHC: 29.5 g/dL — ABNORMAL LOW (ref 30.0–36.0)
MCV: 105.7 fL — ABNORMAL HIGH (ref 80.0–100.0)
Monocytes Absolute: 1.9 10*3/uL — ABNORMAL HIGH (ref 0.1–1.0)
Monocytes Relative: 16 %
Neutro Abs: 7.8 10*3/uL — ABNORMAL HIGH (ref 1.7–7.7)
Neutrophils Relative %: 65 %
Platelets: 131 10*3/uL — ABNORMAL LOW (ref 150–400)
RBC: 3.14 MIL/uL — ABNORMAL LOW (ref 4.22–5.81)
RDW: 16.2 % — ABNORMAL HIGH (ref 11.5–15.5)
WBC: 12 10*3/uL — ABNORMAL HIGH (ref 4.0–10.5)
nRBC: 0 % (ref 0.0–0.2)

## 2020-09-05 LAB — COMPREHENSIVE METABOLIC PANEL
ALT: 18 U/L (ref 0–44)
AST: 25 U/L (ref 15–41)
Albumin: 3.2 g/dL — ABNORMAL LOW (ref 3.5–5.0)
Alkaline Phosphatase: 51 U/L (ref 38–126)
Anion gap: 11 (ref 5–15)
BUN: 36 mg/dL — ABNORMAL HIGH (ref 8–23)
CO2: 28 mmol/L (ref 22–32)
Calcium: 8.4 mg/dL — ABNORMAL LOW (ref 8.9–10.3)
Chloride: 98 mmol/L (ref 98–111)
Creatinine, Ser: 1.5 mg/dL — ABNORMAL HIGH (ref 0.61–1.24)
GFR, Estimated: 42 mL/min — ABNORMAL LOW (ref >60)
Glucose, Bld: 102 mg/dL — ABNORMAL HIGH (ref 70–99)
Potassium: 3.6 mmol/L (ref 3.5–5.1)
Sodium: 137 mmol/L (ref 135–145)
Total Bilirubin: 1.1 mg/dL (ref 0.3–1.2)
Total Protein: 6.9 g/dL (ref 6.5–8.1)

## 2020-09-05 LAB — URIC ACID: Uric Acid, Serum: 9.5 mg/dL — ABNORMAL HIGH (ref 3.7–8.6)

## 2020-09-05 LAB — BRAIN NATRIURETIC PEPTIDE: B Natriuretic Peptide: 296.3 pg/mL — ABNORMAL HIGH (ref 0.0–100.0)

## 2020-09-05 LAB — MAGNESIUM: Magnesium: 2.4 mg/dL (ref 1.7–2.4)

## 2020-09-05 LAB — D-DIMER, QUANTITATIVE: D-Dimer, Quant: 1.96 ug/mL-FEU — ABNORMAL HIGH (ref 0.00–0.50)

## 2020-09-05 MED ORDER — CEPHALEXIN 250 MG PO CAPS: 250.0000 mg | ORAL_CAPSULE | Freq: Three times a day (TID) | ORAL | 0 refills | Status: DC

## 2020-09-05 MED ORDER — FUROSEMIDE 10 MG/ML IJ SOLN
40.0000 mg | Freq: Once | INTRAMUSCULAR | Status: AC
  Administered 2020-09-05: 40 mg via INTRAVENOUS
  Filled 2020-09-05: qty 4

## 2020-09-05 MED ORDER — CEPHALEXIN 250 MG PO CAPS
250.0000 mg | ORAL_CAPSULE | Freq: Once | ORAL | Status: AC
  Administered 2020-09-05: 250 mg via ORAL
  Filled 2020-09-05: qty 1

## 2020-09-05 NOTE — Telephone Encounter (Signed)
° °  Pt c/o swelling: STAT is pt has developed SOB within 24 hours  1) How much weight have you gained and in what time span? 7 lbs in one day  2) If swelling, where is the swelling located? Left leg  3) Are you currently taking a fluid pill? yes  4) Are you currently SOB? Daughter-in-law states they can tell he is having some slight wheezing in his chest   5) Do you have a log of your daily weights (if so, list)? yes  6) Have you gained 3 pounds in a day or 5 pounds in a week? yes  7) Have you traveled recently? No  Daughter-in-law states patient feels very thirsty and that he is having severe pain from knee down in lower left leg that started over the weekend. He has taken some Ibuprofen for the pain. She also says that the patient was disoriented and problems with his balance due to the leg pain on Saturday night. Patient also has a pacemaker.

## 2020-09-05 NOTE — Discharge Instructions (Addendum)
As discussed, your evaluation today has been largely reassuring.  But, it is important that you monitor your condition carefully, and do not hesitate to return to the ED if you develop new, or concerning changes in your condition.  However, today's findings do suggest that you have an infection in your left lower leg.  Your swelling is likely due to this, possibly exacerbated by your history of heart failure.  Due to these concerns it is boring that you follow-up with your physician, and your cardiologist for appropriate ongoing management.  Tomorrow you should return here for ultrasound study of your leg to exclude the possibility of a blood clot as well.

## 2020-09-05 NOTE — Telephone Encounter (Signed)
Called and spoke with Matthew Howard who reports they had waited in the ED for 2.5 hrs and only blood work and EKG had been completed.  She reports asking how much longer it would be before pt was seen and was told probably a few more hours.  She decided not to wait there any longer for care.  She is asking for pt's medications to be adjusted over the phone.  She reports they gave the pt Furosemide 80 mg this morning and have not seen much increase in outpt.  Advised [name] pt's BUN/Crea (36/1.50) are elevated and oral medication may not be the most appropriate treatment at this point. With her report of a 10 lb overnight wt gain - he may need IV.  BNP is 296.3.   Also advised WBC are elevated at 12 - he could have an infection of some sort.  Hemoglobin and PLT are decreased as well.  Advised again the best place for the pt to receive evaluation and treatment for these issues is the ED at this point.  Matthew Howard states understanding and reports she will take him back for further care.

## 2020-09-05 NOTE — ED Provider Notes (Signed)
[facility] EMERGENCY DEPARTMENT Provider Note   CSN: [id number] Arrival date & time: 09/05/20  1151     History Chief Complaint  Patient presents with   Leg Pain    Matthew Howard is a 86 y.o. male.  HPI Patient presents with his daughter-in-law who assists with much of the HPI. Patient has multiple medical issues, including relatively recently diagnosed dementia.  Line patient also has A. fib, gout, right inguinal hernia, diastolic heart failure. He presents today due to new swelling in the left leg with erythema anteriorly.  There is associated weight gain over the past 24 hours reportedly 10 pounds. Patient himself states that his leg is sore, denies chest pain, denies abdominal pain, denies nausea, vomiting, changes in his hernia. Daughter-in-law notes that the patient has had changes in his Lasix recently after decrease in dose did not result in improvement in his condition.  He is seemingly taking 40 mg of Lasix twice daily currently. Today's episode began over the past day or 2, has had no clear alleviating or exacerbating elements.   Past Medical History:  Diagnosis Date   Advance directive discussed with patient 07/15/2015   Atrial fibrillation (HCC) 01/23/2011   Atrial flutter (HCC)    not well documented   Benign prostatic hyperplasia (BPH) with urinary urgency 09/11/2007   Qualifier: Diagnosis of  By: Alphonsus Sias MD, Ronnette Hila    Benign prostatic hypertrophy    Gout    Gout 09/11/2007   Qualifier: Diagnosis of  By: Alphonsus Sias MD, Ronnette Hila    Hyperlipidemia    Left inguinal hernia 03/29/2020   Mobitz type II atrioventricular block 01/25/2014   Pacemaker 04/27/2014   Pedal edema 09/10/2016   Routine general medical examination at a health care facility 07/01/2013   Scrotal right inuinal hernia 08/13/2019   Symptomatic bradycardia    s/p dual chamber PPM implantation April 2015 [facility])    Patient Active Problem List    Diagnosis Date Noted   Vascular dementia, uncomplicated (HCC) 06/21/2020   Chronic diastolic heart failure (HCC) 06/21/2020   Acute delirium 06/15/2020   Urinary frequency 06/15/2020   Fatigue 06/07/2020   Superficial injury of skin 06/07/2020   Symptomatic bradycardia 05/30/2020   Left inguinal hernia 03/29/2020   Scrotal right inuinal hernia 08/13/2019   Pedal edema 09/10/2016   Advance directive discussed with patient 07/15/2015   Pacemaker 04/27/2014   Mobitz type II atrioventricular block 01/25/2014   Routine general medical examination at a health care facility 07/01/2013   Atrial fibrillation (HCC) 01/23/2011   Gout 09/11/2007   Benign prostatic hyperplasia (BPH) with urinary urgency 09/11/2007    Past Surgical History:  Procedure Laterality Date   CATARACT EXTRACTION     PACEMAKER INSERTION  01-25-2014   STJ Assurity dual chamber pacemaker implanted by Dr [name] for symptomatic bradycardia   PERMANENT PACEMAKER INSERTION N/A 01/25/2014   Procedure: PERMANENT PACEMAKER INSERTION;  Surgeon: Marinus Maw, MD;  Location: [facility] CATH LAB;  Service: Cardiovascular;  Laterality: N/A;   TONSILLECTOMY         Family History  Problem Relation Age of Onset   Cancer Mother    Throat cancer Father     Social History   Tobacco Use   Smoking status: Never Smoker   Smokeless tobacco: Never Used  Vaping Use   Vaping Use: Never used  Substance Use Topics   Alcohol use: Yes    Comment: rare etoh use   Drug use: No  Home Medications Prior to Admission medications   Medication Sig Start Date End Date Taking? Authorizing Provider  aspirin 81 MG EC tablet Take 81 mg by mouth daily.    Yes [provider]  Cholecalciferol (VITAMIN D) 50 MCG (2000 UT) tablet Take 2,000 Units by mouth daily.    Yes [provider]  Cyanocobalamin 2500 MCG TABS Take 2,500 mcg by mouth daily.    Yes [provider]  donepezil (ARICEPT) 5 MG  tablet Take 5 mg by mouth daily.    Yes [provider]  finasteride (PROSCAR) 5 MG tablet TAKE 1 TABLET BY MOUTH DAILY Patient taking differently: Take 5 mg by mouth daily.  09/12/16  Yes [name], MD  furosemide (LASIX) 40 MG tablet Take 1 tablet (40 mg total) by mouth 2 (two) times daily. 07/26/20  Yes Jake Bathe, MD  ibuprofen (ADVIL) 200 MG tablet Take 600-800 mg by mouth every 6 (six) hours as needed for mild pain or moderate pain.   Yes [provider]  Magnesium Oxide (MAG-200) 200 MG TABS Take 1 tablet by mouth daily as needed (cramps).   Yes [provider]  melatonin 5 MG TABS Take 5 mg by mouth at bedtime.   Yes [provider]  polyethylene glycol (MIRALAX / GLYCOLAX) 17 g packet Take 17 g by mouth daily.   Yes [provider]  Tamsulosin HCl (FLOMAX) 0.4 MG CAPS Take 1 capsule (0.4 mg total) by mouth daily. 06/23/12  Yes [name], MD    Allergies    Patient has no known allergies.  Review of Systems   Review of Systems  Constitutional:       Per HPI, otherwise negative  HENT:       Per HPI, otherwise negative  Respiratory:       Per HPI, otherwise negative  Cardiovascular:       Per HPI, otherwise negative  Gastrointestinal: Negative for vomiting.  Endocrine:       Negative aside from HPI  Genitourinary:       Neg aside from HPI   Musculoskeletal:       Per HPI, otherwise negative  Skin: Positive for color change.  Allergic/Immunologic: Negative for immunocompromised state.  Neurological: Positive for weakness. Negative for syncope.  Hematological: Bruises/bleeds easily.    Physical Exam Updated Vital Signs BP (!) 122/52 (BP Location: Right Arm)    Pulse 69    Temp (!) 97.5 F (36.4 C) (Oral)    Resp 20    Ht 6' (1.829 m)    Wt 80.7 kg    SpO2 100%    BMI 24.13 kg/m   Physical Exam Vitals and nursing note reviewed.  Constitutional:      Appearance: He is well-developed. He is not ill-appearing  or diaphoretic.     Comments: Thin elderly male sitting upright, smiling, pleasantly interactive  HENT:     Head: Normocephalic and atraumatic.  Eyes:     Conjunctiva/sclera: Conjunctivae normal.  Cardiovascular:     Rate and Rhythm: Normal rate and regular rhythm.  Pulmonary:     Effort: Pulmonary effort is normal. No respiratory distress.     Breath sounds: No stridor.  Abdominal:     General: There is no distension.     Comments: Protuberant, nontender abdomen, no guarding.  Genitourinary:    Comments: Right inguinal hernia, grossly evident, patient denies any pain, complaints Skin:    General: Skin is warm and dry.  Comments: Patient moves all extremity spontaneously, has no deformity of the ankle, nor knee itself.   Neurological:     Mental Status: He is alert and oriented to person, place, and time.  Psychiatric:        Behavior: Behavior is slowed.        Cognition and Memory: Memory is impaired.      ED Results / Procedures / Treatments   Labs (all labs ordered are listed, but only abnormal results are displayed) Labs Reviewed  CBC WITH DIFFERENTIAL/PLATELET - Abnormal; Notable for the following components:      Result Value   WBC 12.0 (*)    RBC 3.14 (*)    Hemoglobin 9.8 (*)    HCT 33.2 (*)    MCV 105.7 (*)    MCHC 29.5 (*)    RDW 16.2 (*)    Platelets 131 (*)    Neutro Abs 7.8 (*)    Monocytes Absolute 1.9 (*)    All other components within normal limits  BRAIN NATRIURETIC PEPTIDE - Abnormal; Notable for the following components:   B Natriuretic Peptide 296.3 (*)    All other components within normal limits  COMPREHENSIVE METABOLIC PANEL - Abnormal; Notable for the following components:   Glucose, Bld 102 (*)    BUN 36 (*)    Creatinine, Ser 1.50 (*)    Calcium 8.4 (*)    Albumin 3.2 (*)    GFR, Estimated 42 (*)    All other components within normal limits  D-DIMER, QUANTITATIVE (NOT AT [facility]) - Abnormal; Notable for the following  components:   D-Dimer, Quant 1.96 (*)    All other components within normal limits  URIC ACID - Abnormal; Notable for the following components:   Uric Acid, Serum 9.5 (*)    All other components within normal limits  MAGNESIUM    EKG EKG Interpretation  Date/Time:  Monday September 05 2020 12:02:13 EST Ventricular Rate:  70 PR Interval:    QRS Duration: 158 QT Interval:  474 QTC Calculation: 511 R Axis:   -78 Text Interpretation: Ventricular-paced rhythm Abnormal ECG Confirmed by Gerhard Munch [phone]) on 09/05/2020 7:02:18 PM   Radiology No results found.  Procedures Procedures (including critical care time)  Medications Ordered in ED Medications  furosemide (LASIX) injection 40 mg (40 mg Intravenous Given 09/05/20 2056)  cephALEXin (KEFLEX) capsule 250 mg (250 mg Oral Given 09/05/20 2058)    ED Course  I have reviewed the triage vital signs and the nursing notes.  Pertinent labs & imaging results that were available during my care of the patient were reviewed by me and considered in my medical decision making (see chart for details).    8:52 PM On repeat exam patient is awake, alert, smiling, states that he was ready to go home. He, his daughter-in-law and I discussed today's findings. Findings notable for slight elevation in BUN, creatinine, consistent with ongoing Lasix dosing.  Patient is not hypotensive, has no evidence for hemodynamic instability, but with his history of diastolic dysfunction, will receive IV Lasix here, will have referral to our cardiology clinic, and follow-up with him within a week. In regards to the patient's lower extremity edema, given the concern for his heart failure contributing to this, there is suspicion for cellulitis given the presence of a obvious cutaneous lesion with surrounding erythema. Patient will start Keflex, lower dose given his renal dysfunction. Patient does have history of A. fib, is not currently anticoagulated, but with  slight elevation  in his D-dimer will have return in the a.m. for ultrasound of the leg to exclude DVT. I discussed admission to facility all this versus discharge with close outpatient follow-up, as above, and the patient clearly has strong preference for the latter.  Given his advanced age, preference for being at home overnight, this was accommodated, and is reasonable. Patient discharged after receiving initial antibiotics, IV Lasix.  MDM Rules/Calculators/A&P MDM Number of Diagnoses or Management Options Acute on chronic diastolic congestive heart failure (HCC): established, worsening Cellulitis of left lower extremity: new, needed workup   Amount and/or Complexity of Data Reviewed Clinical lab tests: reviewed Tests in the medicine section of CPT: reviewed Decide to obtain previous medical records or to obtain history from someone other than the patient: yes Obtain history from someone other than the patient: yes Review and summarize past medical records: yes  Risk of Complications, Morbidity, and/or Mortality Presenting problems: high Diagnostic procedures: high Management options: high  Critical Care Total time providing critical care: < 30 minutes  Patient Progress Patient progress: stable  Final Clinical Impression(s) / ED Diagnoses Final diagnoses:  Cellulitis of left lower extremity  Acute on chronic diastolic congestive heart failure (HCC)    Rx / DC Orders ED Discharge Orders         Ordered    cephALEXin (KEFLEX) 250 MG capsule  3 times daily        09/05/20 2111    AMB referral to CHF clinic        09/05/20 2111    LE VENOUS        09/05/20 2111           Gerhard MunchLockwood, Keymari Sato, MD 09/05/20 2112

## 2020-09-05 NOTE — ED Triage Notes (Signed)
Pt brought to ED by daughter in law for increase weight for the past 3 days from 86 yo 188 lb, left leg pain and swollen that family will like to see if pt has a blood clot.  Pt is AO to self as base line, NAD noticed during triage.

## 2020-09-05 NOTE — Telephone Encounter (Signed)
Called to speak with Matthew Howard at phone # in message (DPR on file).  LM to CB to discuss pt's leg pain and wt gain.  Need to know how pt is currently taking Furosemide.  See phone note below:   08/02/20 5:03 PM Note Spoke with daughter, Matthew Howard, who is aware per Dr Anne Fu - OK to decrease to 40 mg daily, continue to wt daily and if wt goes up 2 to 3 lbs to take additional 40 mg.  She will c/b with any further questions or concerns.       The chart still lists 40 mg BID.

## 2020-09-05 NOTE — Telephone Encounter (Signed)
He is in the ER with what sounds like a CHF exacerbation. Will await the disposition there to see if he needs admission

## 2020-09-05 NOTE — Telephone Encounter (Signed)
[facility] Day - Client TELEPHONE ADVICE RECORD [facility] Patient Name: Matthew Howard Gender: Male DOB: 1919/09/21 Age: 86 Y 1 M 14 D Return Phone Number: [phone] (Primary), [phone] (Secondary) Address[place] [address] Client [facility] Day - Client Client Site [facility] - Day Physician Tillman Abide- MD Contact Type Call Who Is Calling Patient / Member / Family / Caregiver Call Type Triage / Clinical Caller Name Elazar Argabright Relationship To Patient Other Return Phone Number [phone] (Primary) Chief Complaint Leg Swelling And Edema Reason for Call Symptomatic / Request for Health Information Initial Comment Caller states her father in law is wheezing on and off, has gained 10 lbs in one day, and has swelling. Translation No Nurse Assessment Nurse: Lynwood Dawley, RN, [place] Date/Time (Eastern Time): 09/05/2020 10:50:43 AM Confirm and document reason for call. If symptomatic, describe symptoms. ---caller states is wheezing. gained 10 pounds in 1 day. taking lasix 40 mg two times a day. swelling in legs Does the patient have any new or worsening symptoms? ---Yes Will a triage be completed? ---Yes Related visit to physician within the last 2 weeks? ---No Does the PT have any chronic conditions? (i.e. diabetes, asthma, this includes High risk factors for pregnancy, etc.) ---Yes List chronic conditions. ---heart failure Is this a behavioral health or substance abuse call? ---No Guidelines Guideline Title Affirmed Question Affirmed Notes Nurse Date/Time [name] Time) Leg Swelling and Edema Difficulty breathing at rest [place], RN, [place] 09/05/2020 10:53:55 AM Disp. Time [name] Time) Disposition Final User 09/05/2020 10:48:59 AM Send to Urgent Blima Singer, Tiffany 09/05/2020 10:56:21 AM Go to ED Now Yes Lynwood Dawley, RN, Concepcion [place]k Disagree/Comply  Comply Caller Understands Yes PLEASE NOTE: All timestamps contained within this report are represented as [place] Standard Time. CONFIDENTIALTY NOTICE: This fax transmission is intended only for the addressee. It contains information that is legally privileged, confidential or otherwise protected from use or disclosure. If you are not the intended recipient, you are strictly prohibited from reviewing, disclosing, copying using or disseminating any of this information or taking any action in reliance on or regarding this information. If you have received this fax in error, please notify us immediately by telephone so that we can arrange for its return to [place]. Phone: [phone], Toll-Free: [phone], Fax: [phone] Page: 2 of 2 Call Id: [id number] PreDisposition Call Doctor Care Advice Given Per Guideline GO TO ED NOW: * You need to be seen in the Emergency Department. NOTE TO TRIAGER - DRIVING: * Another adult should drive. CALL EMS 911 IF: * Call EMS if you become worse. CARE ADVICE given per Leg Swelling and Edema (Adult) guideline. Referrals GO TO FACILITY UNDECIDED

## 2020-09-05 NOTE — ED Notes (Signed)
Daughter I law stated, he has gained 10lbs in the last 24 hours.

## 2020-09-05 NOTE — ED Notes (Signed)
Pt returned with daughter to check in again after LWBS, discharge undone.

## 2020-09-05 NOTE — Telephone Encounter (Signed)
Follow up   Matthew Howard is returning call to [facility]   Please call back

## 2020-09-05 NOTE — Telephone Encounter (Signed)
Pt daughter called in and stated they went to ed but did not stay due to 3 hr wait.  She does not want to take him to a urgent care either.  She stated they will not do much.  She would like to speak with nurse.  About his meds.  She stated that the ed did do blood work and should be in the system to look at .      Best number to call her back is [phone]

## 2020-09-05 NOTE — Telephone Encounter (Signed)
Spoke with Eunice Blase who reports pt's wt is up "10 lbs overnight"  Today wt is 188 lbs.  He is having some slight wheezing in his chest but mainly she is concerned because pt is having severe pain in his left leg.  She reports it does have some discoloration and is swollen from his left to his knee.  There is no swelling in the right leg.  Pt has been taking Furosemide 40 mg BID for some time now which does not seem to help with the left leg swelling.  They had given him some Ibuprofen over the weekend for the leg pain thinking maybe he had gout.  This has not improved the leg pain.  D/T pt's HX: of At Fib and not being on anticoagulation with severe leg pain, wheezing and unilateral leg swelling - daughter is advised to have pt evaluated at the closes ED for eval and treatment.  She states understanding and will have him evaluated ASAP.

## 2020-09-05 NOTE — ED Notes (Signed)
Provider at bedside

## 2020-09-05 NOTE — ED Notes (Signed)
Pt called several times for vitals w/o response

## 2020-09-05 NOTE — Telephone Encounter (Signed)
Matthew Howard calling back.

## 2020-09-05 NOTE — ED Notes (Signed)
Pt name called for updated vitals, no response 

## 2020-09-06 ENCOUNTER — Ambulatory Visit (HOSPITAL_COMMUNITY)
Admission: RE | Admit: 2020-09-06 | Discharge: 2020-09-06 | Disposition: A | Discharge status: Home / Self Care | Payer: Medicare Other | Source: Ambulatory Visit | Attending: Emergency Medicine | Admitting: Emergency Medicine

## 2020-09-06 DIAGNOSIS — L539 Erythematous condition, unspecified: Secondary | ICD-10-CM | POA: Insufficient documentation

## 2020-09-06 DIAGNOSIS — R609 Edema, unspecified: Secondary | ICD-10-CM | POA: Diagnosis not present

## 2020-09-06 DIAGNOSIS — M79609 Pain in unspecified limb: Secondary | ICD-10-CM

## 2020-09-06 DIAGNOSIS — R600 Localized edema: Secondary | ICD-10-CM | POA: Insufficient documentation

## 2020-09-06 DIAGNOSIS — L538 Other specified erythematous conditions: Secondary | ICD-10-CM

## 2020-09-06 NOTE — Telephone Encounter (Signed)
Doppler study completed with no evidence of DVT.

## 2020-09-06 NOTE — Progress Notes (Signed)
Left lower extremity venous duplex completed. Refer to "CV Proc" under chart review to view preliminary results.  09/06/2020 11:49 AM Eula Fried., MHA, RVT, RDCS, RDMS

## 2020-09-06 NOTE — Telephone Encounter (Signed)
Pt was taken back to ED for eval and treatment.  Scheduled for lower extremity venous doppler to r/o DVT, give IV Furosemide for CHF and started on keflex for cellulitis of left leg. He is scheduled for 1 week f/u with Dr Anne Fu 09/14/2020 at 11:40 am.

## 2020-09-08 ENCOUNTER — Telehealth: Payer: Self-pay

## 2020-09-08 ENCOUNTER — Ambulatory Visit: Payer: Medicare Other | Admitting: Cardiology

## 2020-09-08 ENCOUNTER — Encounter: Payer: Self-pay | Admitting: Cardiology

## 2020-09-08 ENCOUNTER — Other Ambulatory Visit: Payer: Self-pay

## 2020-09-08 VITALS — BP 112/40 | HR 70 | Ht 72.0 in | Wt 183.0 lb

## 2020-09-08 DIAGNOSIS — Z950 Presence of cardiac pacemaker: Secondary | ICD-10-CM | POA: Diagnosis not present

## 2020-09-08 DIAGNOSIS — Z79899 Other long term (current) drug therapy: Secondary | ICD-10-CM

## 2020-09-08 DIAGNOSIS — I5032 Chronic diastolic (congestive) heart failure: Secondary | ICD-10-CM | POA: Diagnosis not present

## 2020-09-08 MED ORDER — FUROSEMIDE 80 MG PO TABS: 80.0000 mg | ORAL_TABLET | Freq: Two times a day (BID) | ORAL | 6 refills | Status: DC

## 2020-09-08 MED ORDER — FUROSEMIDE 80 MG PO TABS: 80.0000 mg | ORAL_TABLET | Freq: Every day | ORAL | 6 refills | Status: DC

## 2020-09-08 NOTE — Addendum Note (Signed)
Addended by: Sharin Grave on: 09/08/2020 05:35 PM   Modules accepted: Orders

## 2020-09-08 NOTE — Telephone Encounter (Signed)
Cellulitis of left lower extremity and diastolic congestive heart failure   Supposed to have ultrasound of his leg today (09-06-20) If seeing cardiologist this week, can defer my follow up---otherwise, I need to see him   Left message for [name] to call office. He is scheduled with Cardiology 09-14-20.

## 2020-09-08 NOTE — Progress Notes (Signed)
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 potassium  Cheese - 1 oz has 20-30 mg of potassium  Cranberries -  cup has 45 mg of potassium  Cranberry juice cocktail -  cup has 20 mg of potassium  Fats and oils - 1 Tbsp has less than 5 mg of potassium  Hummus - 1 Tbsp has 32 mg of potassium  Nectar (papaya, mango, or pear) -  cup has 35 mg of potassium  Rice (white or brown) -  cup has 50 mg of potassium  Spaghetti or macaroni (cooked) -  cup has 30 mg of potassium  Tortilla, flour or corn - 1 has 50 mg of potassium  Waffle - 1 four-inch waffle has 50 mg of potassium  Water chestnuts -  cup has 40 mg of potassium Summary  Potassium is a mineral found in many foods and drinks. It affects how the heart  works, and helps keep fluids and minerals balanced in the body.  The amount of potassium you need each day depends on your age and any existing medical conditions  you may have. Your health care provider or dietitian may recommend an amount of potassium that you should have each day. This information is not intended to replace advice given to you by your health care provider. Make sure you discuss any questions you have with your health care provider. Document Revised: 09/06/2017 Document Reviewed: 12/19/2016 Elsevier Patient Education  2020 [employer].     Signed, [name], MD  09/08/2020 5:30 PM    [facility]

## 2020-09-08 NOTE — Patient Instructions (Addendum)
Medication Instructions:  Please increase Furosemide to 80 mg twice a day. Continue all other medications as listed.  *If you need a refill on your cardiac medications before your next appointment, please call your pharmacy*  Please eat foods high in potassium daily.  Lab Work: Please have blood work in 1 week. (BMP)  If you have labs (blood work) drawn today and your tests are completely normal, you will receive your results only by: Marland Kitchen MyChart Message (if you have MyChart) OR . A paper copy in the mail If you have any lab test that is abnormal or we need to change your treatment, we will call you to review the results.  Follow-Up: At [facility], you and your health needs are our priority.  As part of our continuing mission to provide you with exceptional heart care, we have created designated Provider Care Teams.  These Care Teams include your primary Cardiologist (physician) and Advanced Practice Providers (APPs -  Physician Assistants and Nurse Practitioners) who all work together to provide you with the care you need, when you need it.  We recommend signing up for the patient portal called "MyChart".  Sign up information is provided on this After Visit Summary.  MyChart is used to connect with patients for Virtual Visits (Telemedicine).  Patients are able to view lab/test results, encounter notes, upcoming appointments, etc.  Non-urgent messages can be sent to your provider as well.   To learn more about what you can do with MyChart, go to [email or web address].    Your next appointment:   Follow up with Dr Anne Fu as previously scheduled.  Thank you for choosing [facility]!!     Heart-Healthy Eating Plan Many factors influence your heart (coronary) health, including eating and exercise habits. Coronary risk increases with abnormal blood fat (lipid) levels. Heart-healthy meal planning includes limiting unhealthy fats, increasing healthy fats, and making other diet  and lifestyle changes.  What are tips for following this plan? Cooking Cook foods using methods other than frying. Baking, boiling, grilling, and broiling are all good options. Other ways to reduce fat include:  Removing the skin from poultry.  Removing all visible fats from meats.  Steaming vegetables in water or broth. Meal planning   At meals, imagine dividing your plate into fourths: ? Fill one-half of your plate with vegetables and green salads. ? Fill one-fourth of your plate with whole grains. ? Fill one-fourth of your plate with lean protein foods.  Eat 4-5 servings of vegetables per day. One serving equals 1 cup raw or cooked vegetable, or 2 cups raw leafy greens.  Eat 4-5 servings of fruit per day. One serving equals 1 medium whole fruit,  cup dried fruit,  cup fresh, frozen, or canned fruit, or  cup 100% fruit juice.  Eat more foods that contain soluble fiber. Examples include apples, broccoli, carrots, beans, peas, and barley. Aim to get 25-30 g of fiber per day.  Increase your consumption of legumes, nuts, and seeds to 4-5 servings per week. One serving of dried beans or legumes equals  cup cooked, 1 serving of nuts is  cup, and 1 serving of seeds equals 1 tablespoon. Fats  Choose healthy fats more often. Choose monounsaturated and polyunsaturated fats, such as olive and canola oils, flaxseeds, walnuts, almonds, and seeds.  Eat more omega-3 fats. Choose salmon, mackerel, sardines, tuna, flaxseed oil, and ground flaxseeds. Aim to eat fish at least 2 times each week.  Check food labels carefully to identify foods  with trans fats or high amounts of saturated fat.  Limit saturated fats. These are found in animal products, such as meats, butter, and cream. Plant sources of saturated fats include palm oil, palm kernel oil, and coconut oil.  Avoid foods with partially hydrogenated oils in them. These contain trans fats. Examples are stick margarine, some tub margarines,  cookies, crackers, and other baked goods.  Avoid fried foods. General information  Eat more home-cooked food and less restaurant, buffet, and fast food.  Limit or avoid alcohol.  Limit foods that are high in starch and sugar.  Lose weight if you are overweight. Losing just 5-10% of your body weight can help your overall health and prevent diseases such as diabetes and heart disease.  Monitor your salt (sodium) intake, especially if you have high blood pressure. Talk with your health care provider about your sodium intake.  Try to incorporate more vegetarian meals weekly. What foods can I eat? Fruits All fresh, canned (in natural juice), or frozen fruits. Vegetables Fresh or frozen vegetables (raw, steamed, roasted, or grilled). Green salads. Grains Most grains. Choose whole wheat and whole grains most of the time. Rice and pasta, including brown rice and pastas made with whole wheat. Meats and other proteins Lean, well-trimmed beef, veal, pork, and lamb. Chicken and [place] without skin. All fish and shellfish. Wild duck, rabbit, pheasant, and venison. Egg whites or low-cholesterol egg substitutes. Dried beans, peas, lentils, and tofu. Seeds and most nuts. Dairy Low-fat or nonfat cheeses, including ricotta and mozzarella. Skim or 1% milk (liquid, powdered, or evaporated). Buttermilk made with low-fat milk. Nonfat or low-fat yogurt. Fats and oils Non-hydrogenated (trans-free) margarines. Vegetable oils, including soybean, sesame, sunflower, olive, peanut, safflower, corn, canola, and cottonseed. Salad dressings or mayonnaise made with a vegetable oil. Beverages Water (mineral or sparkling). Coffee and tea. Diet carbonated beverages. Sweets and desserts Sherbet, gelatin, and fruit ice. Small amounts of dark chocolate. Limit all sweets and desserts. Seasonings and condiments All seasonings and condiments. The items listed above may not be a complete list of foods and beverages you can  eat. Contact a dietitian for more options. What foods are not recommended? Fruits Canned fruit in heavy syrup. Fruit in cream or butter sauce. Fried fruit. Limit coconut. Vegetables Vegetables cooked in cheese, cream, or butter sauce. Fried vegetables. Grains Breads made with saturated or trans fats, oils, or whole milk. Croissants. Sweet rolls. Donuts. High-fat crackers, such as cheese crackers. Meats and other proteins Fatty meats, such as hot dogs, ribs, sausage, bacon, rib-eye roast or steak. High-fat deli meats, such as salami and bologna. Caviar. Domestic duck and goose. Organ meats, such as liver. Dairy Cream, sour cream, cream cheese, and creamed cottage cheese. Whole milk cheeses. Whole or 2% milk (liquid, evaporated, or condensed). Whole buttermilk. Cream sauce or high-fat cheese sauce. Whole-milk yogurt. Fats and oils Meat fat, or shortening. Cocoa butter, hydrogenated oils, palm oil, coconut oil, palm kernel oil. Solid fats and shortenings, including bacon fat, salt pork, lard, and butter. Nondairy cream substitutes. Salad dressings with cheese or sour cream. Beverages Regular sodas and any drinks with added sugar. Sweets and desserts Frosting. Pudding. Cookies. Cakes. Pies. Milk chocolate or white chocolate. Buttered syrups. Full-fat ice cream or ice cream drinks. The items listed above may not be a complete list of foods and beverages to avoid. Contact a dietitian for more information. Summary  Heart-healthy meal planning includes limiting unhealthy fats, increasing healthy fats, and making other diet and lifestyle changes.  Lose  weight if you are overweight. Losing just 5-10% of your body weight can help your overall health and prevent diseases such as diabetes and heart disease.  Focus on eating a balance of foods, including fruits and vegetables, low-fat or nonfat dairy, lean protein, nuts and legumes, whole grains, and heart-healthy oils and fats. This information is not  intended to replace advice given to you by your health care provider. Make sure you discuss any questions you have with your health care provider. Document Revised: 11/01/2017 Document Reviewed: 11/01/2017 Elsevier Patient Education  2020 Elsevier Inc.   Potassium Content of Foods  Potassium is a mineral found in many foods and drinks. It affects how the heart works, and helps keep fluids and minerals balanced in the body. The amount of potassium you need each day depends on your age and any medical conditions you may have. Talk to your health care provider or dietitian about how much potassium you need. The following lists of foods provide the general serving size for foods and the approximate amount of potassium in each serving, listed in milligrams (mg). Actual values may vary depending on the product and how it is processed. High in potassium The following foods and beverages have 200 mg or more of potassium per serving:  Apricots (raw) - 2 have 200 mg of potassium.  Apricots (dry) - 5 have 200 mg of potassium.  Artichoke - 1 medium has 345 mg of potassium.  Avocado -  fruit has 245 mg of potassium.  Banana - 1 medium fruit has 425 mg of potassium.  [place]ima or baked beans (canned) -  cup has 280 mg of potassium.  White beans (canned) -  cup has 595 mg potassium.  Beef roast - 3 oz has 320 mg of potassium.  Ground beef - 3 oz has 270 mg of potassium.  Beets (raw or cooked) -  cup has 260 mg of potassium.  Bran muffin - 2 oz has 300 mg of potassium.  Broccoli (cooked) -  cup has 230 mg of potassium.  Brussels sprouts -  cup has 250 mg of potassium.  Cantaloupe -  cup has 215 mg of potassium.  Cereal, 100% bran -  cup has 200-400 mg of potassium.  Cheeseburger -1 single fast food burger has 225-400 mg of potassium.  Chicken - 3 oz has 220 mg of potassium.  Clams (canned) - 3 oz has 535 mg of potassium.  Crab - 3 oz has 225 mg of potassium.  Dates - 5 have 270  mg of potassium.  Dried beans and peas -  cup has 300-475 mg of potassium.  Figs (dried) - 2 have 260 mg of potassium.  Fish (halibut, tuna, cod, snapper) - 3 oz has 480 mg of potassium.  Fish (salmon, haddock, swordfish, perch) - 3 oz has 300 mg of potassium.  Fish (tuna, canned) - 3 oz has 200 mg of potassium.  [place] fries (fast food) - 3 oz has 470 mg of potassium.  Granola with fruit and nuts -  cup has 200 mg of potassium.  Grapefruit juice -  cup has 200 mg of potassium.  Honeydew melon -  cup has 200 mg of potassium.  Kale (raw) - 1 cup has 300 mg of potassium.  Kiwi - 1 medium fruit has 240 mg of potassium.  Kohlrabi, rutabaga, parsnips -  cup has 280 mg of potassium.  Lentils -  cup has 365 mg of potassium.  Mango - 1 each has 325  mg of potassium.  Milk (nonfat, low-fat, whole, buttermilk) - 1 cup has 350-380 mg of potassium.  Milk (chocolate) - 1 cup has 420 mg of potassium  Molasses - 1 Tbsp has 295 mg of potassium.  Mushrooms -  cup has 280 mg of potassium.  Nectarine - 1 each has 275 mg of potassium.  Nuts (almonds, peanuts, hazelnuts, [place], cashew, mixed) - 1 oz has 200 mg of potassium.  Nuts (pistachios) - 1 oz has 295 mg of potassium.  Orange - 1 fruit has 240 mg of potassium.  Orange juice -  cup has 235 mg of potassium.  Papaya -  medium fruit has 390 mg of potassium.  Peanut butter (chunky) - 2 Tbsp has 240 mg of potassium.  Peanut butter (smooth) - 2 Tbsp has 210 mg of potassium.  Pear - 1 medium (200 mg) of potassium.  Pomegranate - 1 whole fruit has 400 mg of potassium.  Pomegranate juice -  cup has 215 mg of potassium.  Pork - 3 oz has 350 mg of potassium.  Potato chips (salted) - 1 oz has 465 mg of potassium.  Potato (baked with skin) - 1 medium has 925 mg of potassium.  Potato (boiled) -  cup has 255 mg of potassium.  Potato (Mashed) -  cup has 330 mg of potassium.  Prune juice -  cup has 370 mg of  potassium.  Prunes - 5 have 305 mg of potassium.  Pudding (chocolate) -  cup has 230 mg of potassium.  Pumpkin (canned) -  cup has 250 mg of potassium.  Raisins (seedless) -  cup has 270 mg of potassium.  Seeds (sunflower or pumpkin) - 1 oz has 240 mg of potassium.  Soy milk - 1 cup has 300 mg of potassium.  Spinach (cooked) - 1/2 cup has 420 mg of potassium.  Spinach (canned) -  cup has 370 mg of potassium.  Sweet potato (baked with skin) - 1 medium has 450 mg of potassium.  Swiss chard -  cup has 480 mg of potassium.  Tomato or vegetable juice -  cup has 275 mg of potassium.  Tomato (sauce or puree) -  cup has 400-550 mg of potassium.  Tomato (raw) - 1 medium has 290 mg of potassium.  Tomato (canned) -  cup has 200-300 mg of potassium.  [place] - 3 oz has 250 mg of potassium.  Wheat germ - 1 oz has 250 mg of potassium.  Winter squash -  cup has 250 mg of potassium.  Yogurt (plain or fruited) - 6 oz has 260-435 mg of potassium.  Zucchini -  cup has 220 mg of potassium. Moderate in potassium The following foods and beverages have 50-200 mg of potassium per serving:  Apple - 1 fruit has 150 mg of potassium  Apple juice -  cup has 150 mg of potassium  Applesauce -  cup has 90 mg of potassium  Apricot nectar -  cup has 140 mg of potassium  Asparagus (small spears) -  cup has 155 mg of potassium  Asparagus (large spears) - 6 have 155 mg of potassium  Bagel (cinnamon raisin) - 1 four-inch bagel has 130 mg of potassium  Bagel (egg or plain) - 1 four- inch bagel has 70 mg of potassium  Beans (green) -  cup has 90 mg of potassium  Beans (yellow) -  cup has 190 mg of potassium  Beer, regular - 12 oz has 100 mg of potassium  Beets (  canned) -  cup has 125 mg of potassium  Blackberries -  cup has 115 mg of potassium  Blueberries -  cup has 60 mg of potassium  Bread (whole wheat) - 1 slice has 70 mg of potassium  Broccoli (raw) -  cup has  145 mg of potassium  Cabbage -  cup has 150 mg of potassium  Carrots (cooked or raw) -  cup has 180 mg of potassium  Cauliflower (raw) -  cup has 150 mg of potassium  Celery (raw) -  cup has 155 mg of potassium  Cereal, bran flakes -  cup has 120-150 mg of potassium  Cheese (cottage) -  cup has 110 mg of potassium  Cherries - 10 have 150 mg of potassium  Chocolate - 1 oz bar has 165 mg of potassium  Coffee (brewed) - 6 oz has 90 mg of potassium  Corn -  cup or 1 ear has 195 mg of potassium  Cucumbers -  cup has 80 mg of potassium  Egg - 1 large egg has 60 mg of potassium  Eggplant -  cup has 60 mg of potassium  Endive (raw) -  cup has 80 mg of potassium  English muffin - 1 has 65 mg of potassium  Fish (ocean perch) - 3 oz has 192 mg of potassium  Frankfurter, beef or pork - 1 has 75 mg of potassium  Fruit cocktail -  cup has 115 mg of potassium  Grape juice -  cup has 170 mg of potassium  Grapefruit -  fruit has 175 mg of potassium  Grapes -  cup has 155 mg of potassium  Greens: kale, turnip, collard -  cup has 110-150 mg of potassium  Ice cream or frozen yogurt (chocolate) -  cup has 175 mg of potassium  Ice cream or frozen yogurt (vanilla) -  cup has 120-150 mg of potassium  Lemons, limes - 1 each has 80 mg of potassium  Lettuce - 1 cup has 100 mg of potassium  Mixed vegetables -  cup has 150 mg of potassium  Mushrooms, raw -  cup has 110 mg of potassium  Nuts (walnuts, pecans, or macadamia) - 1 oz has 125 mg of potassium  Oatmeal -  cup has 80 mg of potassium  Okra -  cup has 110 mg of potassium  Onions -  cup has 120 mg of potassium  Peach - 1 has 185 mg of potassium  Peaches (canned) -  cup has 120 mg of potassium  Pears (canned) -  cup has 120 mg of potassium  Peas, green (frozen) -  cup has 90 mg of potassium  Peppers (Green) -  cup has 130 mg of potassium  Peppers (Red) -  cup has 160 mg of  potassium  Pineapple juice -  cup has 165 mg of potassium  Pineapple (fresh or canned) -  cup has 100 mg of potassium  Plums - 1 has 105 mg of potassium  Pudding, vanilla -  cup has 150 mg of potassium  Raspberries -  cup has 90 mg of potassium  Rhubarb -  cup has 115 mg of potassium  Rice, wild -  cup has 80 mg of potassium  Shrimp - 3 oz has 155 mg of potassium  Spinach (raw) - 1 cup has 170 mg of potassium  Strawberries -  cup has 125 mg of potassium  Summer squash -  cup has 175-200 mg of potassium  Swiss chard (raw) -  1 cup has 135 mg of potassium  Tangerines - 1 fruit has 140 mg of potassium  Tea, brewed - 6 oz has 65 mg of potassium  Turnips -  cup has 140 mg of potassium  Watermelon -  cup has 85 mg of potassium  Wine (Red, table) - 5 oz has 180 mg of potassium  Wine (White, table) - 5 oz 100 mg of potassium Low in potassium The following foods and beverages have less than 50 mg of potassium per serving.  Bread (white) - 1 slice has 30 mg of potassium  Carbonated beverages - 12 oz has less than 5 mg of potassium  Cheese - 1 oz has 20-30 mg of potassium  Cranberries -  cup has 45 mg of potassium  Cranberry juice cocktail -  cup has 20 mg of potassium  Fats and oils - 1 Tbsp has less than 5 mg of potassium  Hummus - 1 Tbsp has 32 mg of potassium  Nectar (papaya, mango, or pear) -  cup has 35 mg of potassium  Rice (white or brown) -  cup has 50 mg of potassium  Spaghetti or macaroni (cooked) -  cup has 30 mg of potassium  Tortilla, flour or corn - 1 has 50 mg of potassium  Waffle - 1 four-inch waffle has 50 mg of potassium  Water chestnuts -  cup has 40 mg of potassium Summary  Potassium is a mineral found in many foods and drinks. It affects how the heart works, and helps keep fluids and minerals balanced in the body.  The amount of potassium you need each day depends on your age and any existing medical conditions you may  have. Your health care provider or dietitian may recommend an amount of potassium that you should have each day. This information is not intended to replace advice given to you by your health care provider. Make sure you discuss any questions you have with your health care provider. Document Revised: 09/06/2017 Document Reviewed: 12/19/2016 Elsevier Patient Education  2020 [employer].

## 2020-09-09 ENCOUNTER — Telehealth: Payer: Self-pay | Admitting: Cardiology

## 2020-09-09 MED ORDER — CEPHALEXIN 250 MG PO CAPS: 250.0000 mg | ORAL_CAPSULE | Freq: Three times a day (TID) | ORAL | 0 refills | Status: AC

## 2020-09-09 NOTE — Telephone Encounter (Signed)
He should separate the doses by 4-6 hours.  In general we tell patients to take first dose when they get up and the second around 12-2 pm, but never after 4pm (or they'll be up much of the night)

## 2020-09-09 NOTE — Addendum Note (Signed)
Addended by: Eual Fines on: 09/09/2020 04:14 PM   Modules accepted: Orders

## 2020-09-09 NOTE — Telephone Encounter (Signed)
The location on the ER record was the left calf below the knee---that is not a location for gout. Send Rx for the cephalexin to extend for 5 more days I should check on Monday if still concerned

## 2020-09-09 NOTE — Telephone Encounter (Signed)
Pt wife called in and would like to know how many hours apart can pt take the furosemide ?  She stated if pt takes it to late he is up going to the restroom.    Best number - [phone]

## 2020-09-09 NOTE — Telephone Encounter (Signed)
Spoke to [employer]. She appreciated the call back. I sent the medication in. She will call back with any concerns. She was asking what to do about his fluid intake with the large amount of lasix he is going on. I asked if he was on a fluid restriction. This might be a question for the doctor doing that med.

## 2020-09-09 NOTE — Telephone Encounter (Signed)
Spoke to [employer]. She is asking if he needs more Keflex because they only gave him 5 days.Looks a little better with abx but still red. Not able to walk on his leg today. Has a H/O Gout. Uric Acid was 9. Very Thirsty. Asking could it be Gout instead of cellulitis. Doppler was negative for DVT. BUN is up. Starting 160mg  of lasix today as he is short of breath.

## 2020-09-09 NOTE — Telephone Encounter (Signed)
Spoke with patient's wife and informed her of pharmacist's recommendations. Understanding verbalized.

## 2020-09-11 ENCOUNTER — Telehealth: Payer: Self-pay | Admitting: Family Medicine

## 2020-09-11 NOTE — Telephone Encounter (Signed)
On call note: Call received from [facility] RN who states pts DIL Matthew Howard called again saying the ? Medial aspect of pts knee is still swollen but is not also red and warm to the touch. No fever, chills. Pt endorses pain with palpation. Pt is on keflex for cellulitis, h/o gout and CHF. Recommended pt be seen and evaluated in UC or ER. RN will relay this info to pts DIL.

## 2020-09-11 NOTE — Telephone Encounter (Signed)
Error

## 2020-09-11 NOTE — Telephone Encounter (Signed)
On call note: Received call from pts DIL Debra who states she has been having pt keep his Lt leg elevated above the level of his heart and the edema that was previously in his foot, ankle, lower leg has now moved around his knee. Pt is afebrile, denies chills. Some pain on the side of his knee but daughter denies warmth to touch, erythema. Pt is on keflex day 6/10 for LLE cellulitis. [place] neg for DVT. He saw cardio on Thurs and lasix was increased from 40mg  BID to 80mg  BID. He is urinating more on increased dose. No increased SOB, DOE, orthopnea. DIL is unsure of what to do about the swelling around his knee. Advised her to lower his leg and that he does not need to keep it elevated constantly. Continue current meds/doses including lasix 80mg  BID and kelfex. Advised DIL to call PCP office in AM to schedule in-person appt. I advised her to take pt to UC or ER if he develops fever, chills, significant leg pain, increased SOB. She expressed understanding. Will route this note to PCP as FYI.

## 2020-09-12 ENCOUNTER — Encounter: Payer: Self-pay | Admitting: Internal Medicine

## 2020-09-12 ENCOUNTER — Ambulatory Visit: Payer: Medicare Other | Admitting: Internal Medicine

## 2020-09-12 ENCOUNTER — Other Ambulatory Visit: Payer: Self-pay

## 2020-09-12 DIAGNOSIS — M79605 Pain in left leg: Secondary | ICD-10-CM | POA: Insufficient documentation

## 2020-09-12 MED ORDER — COLCHICINE 0.6 MG PO TABS: 0.6000 mg | ORAL_TABLET | Freq: Two times a day (BID) | ORAL | 0 refills | Status: DC | PRN

## 2020-09-12 MED ORDER — FUROSEMIDE 80 MG PO TABS
80.0000 mg | ORAL_TABLET | Freq: Two times a day (BID) | ORAL | 6 refills | Status: DC
Start: 2020-09-12 — End: 2020-10-19

## 2020-09-12 NOTE — Progress Notes (Signed)
Subjective:    Patient ID: Matthew Howard, male    DOB: 06-Oct-1919, 86 y.o.   MRN: [id number]  HPI Here with daughter in law due to persistent left leg swelling This visit occurred during the SARS-CoV-2 public health emergency.  Safety protocols were in place, including screening questions prior to the visit, additional usage of staff PPE, and extensive cleaning of exam room while observing appropriate contact time as indicated for disinfecting solutions.   Started ~9 days ago---2 days after Thanksgiving Particularly swollen around the ankle and painful there Pain radiated up towards knee To ER 2 days later--DIL had noted 10# weight gain Redness started at scabbed spot towards upper calf Started on low dose cephalexin and given IV 40mg  of furosemide  Then seen at cardiologist 4 days ago Increased furosemide to 80 bid Swelling is some better  Now voices that soreness is up at knee and more swelling there Now points to upper calf as pain area  Current Outpatient Medications on File Prior to Visit  Medication Sig Dispense Refill  . aspirin 81 MG EC tablet Take 81 mg by mouth daily.     . cephALEXin (KEFLEX) 250 MG capsule Take 1 capsule (250 mg total) by mouth 3 (three) times daily for 5 days. 15 capsule 0  . Cholecalciferol (VITAMIN D) 50 MCG (2000 UT) tablet Take 2,000 Units by mouth daily.     . Cyanocobalamin 2500 MCG TABS Take 2,500 mcg by mouth daily.     donepezil (ARICEPT) 5 MG tablet Take 5 mg by mouth daily.     . finasteride (PROSCAR) 5 MG tablet TAKE 1 TABLET BY MOUTH DAILY 90 tablet 3  . furosemide (LASIX) 80 MG tablet Take 1 tablet (80 mg total) by mouth 2 (two) times daily. 60 tablet 6  . melatonin 5 MG TABS Take 5 mg by mouth at bedtime.    . polyethylene glycol (MIRALAX / GLYCOLAX) 17 g packet Take 17 g by mouth daily.    . Tamsulosin HCl (FLOMAX) 0.4 MG CAPS Take 1 capsule (0.4 mg total) by mouth daily. 90 capsule 1   No current facility-administered  medications on file prior to visit.    No Known Allergies  Past Medical History:  Diagnosis Date  . Advance directive discussed with patient 07/15/2015  . Atrial fibrillation (HCC) 01/23/2011  . Atrial flutter (HCC)    not well documented  . Benign prostatic hyperplasia (BPH) with urinary urgency 09/11/2007   Qualifier: Diagnosis of  By: 14/01/2007 MD, Alphonsus Sias   . Benign prostatic hypertrophy   . Gout   . Gout 09/11/2007   Qualifier: Diagnosis of  By: 14/01/2007 MD, Alphonsus Sias   . Hyperlipidemia   . Left inguinal hernia 03/29/2020  . Mobitz type II atrioventricular block 01/25/2014  . Pacemaker 04/27/2014  . Pedal edema 09/10/2016  . Routine general medical examination at a health care facility 07/01/2013  . Scrotal right inuinal hernia 08/13/2019  . Symptomatic bradycardia    s/p dual chamber PPM implantation April 2015 [facility])    Past Surgical History:  Procedure Laterality Date  . CATARACT EXTRACTION    . PACEMAKER INSERTION  01-25-2014   STJ Assurity dual chamber pacemaker implanted by Dr 01-27-2014 for symptomatic bradycardia  . PERMANENT PACEMAKER INSERTION N/A 01/25/2014   Procedure: PERMANENT PACEMAKER INSERTION;  Surgeon: 01/27/2014, MD;  Location: [facility] CATH LAB;  Service: Cardiovascular;  Laterality: N/A;  . TONSILLECTOMY      Family History  Problem Relation Age of Onset  . Cancer Mother   . Throat cancer Father     Social History   Socioeconomic History  . Marital status: Widowed    Spouse name: Matthew Howard  . Number of children: 4  . Years of education: Not on file  . Highest education level: Not on file  Occupational History  . Occupation: retired    Comment: [employer]  Tobacco Use  . Smoking status: Never Smoker  . Smokeless tobacco: Never Used  Vaping Use  . Vaping Use: Never used  Substance and Sexual Activity  . Alcohol use: Yes    Comment: rare etoh use  . Drug use: No  . Sexual activity: Not on file  Other Topics Concern  . Not on  file  Social History Narrative   Widowed 4/15   No living will   Health care power of attorney is son Matthew Howard. Daughter Matthew Howard is financial POA    He feels he is ready for DNR---form done 08/13/19 (but he isn't sure he will put it up)   No tube feeds if cognitively unaware   Social Determinants of Health   Financial Resource Strain:   . Difficulty of Paying Living Expenses: Not on file  Food Insecurity:   . Worried About Programme researcher, broadcasting/film/video in the Last Year: Not on file  . Ran Out of Food in the Last Year: Not on file  Transportation Needs:   . Lack of Transportation (Medical): Not on file  . Lack of Transportation (Non-Medical): Not on file  Physical Activity:   . Days of Exercise per Week: Not on file  . Minutes of Exercise per Session: Not on file  Stress:   . Feeling of Stress : Not on file  Social Connections:   . Frequency of Communication with Friends and Family: Not on file  . Frequency of Social Gatherings with Friends and Family: Not on file  . Attends Religious Services: Not on file  . Active Member of Clubs or Organizations: Not on file  . Attends Banker Meetings: Not on file  . Marital Status: Not on file  Intimate Partner Violence:   . Fear of Current or Ex-Partner: Not on file  . Emotionally Abused: Not on file  . Physically Abused: Not on file  . Sexually Abused: Not on file   Review of Systems Has SOB when he gets up in the morning--better than before though No wheezing now--was wheezing before the ER visit     Objective:   Physical Exam Musculoskeletal:     Comments: Only mild ankle edema on left now Scabbed lesion on upper left calf is clean and dry Left knee is mildly swollen but not red or tender Mild redness on medial knee along MCL (but not joint)            Assessment & Plan:

## 2020-09-12 NOTE — Telephone Encounter (Signed)
Pt c/o medication issue:  1. Name of Medication: furosemide (LASIX) 80 MG tablet  2. How are you currently taking this medication (dosage and times per day)? 1 tablet (80 mg total) by mouth 2 (two) times daily  3. Are you having a reaction (difficulty breathing--STAT)? No   4. What is your medication issue? Per patient's daughter in law, Gavin Pound, patient's local pharmacy is stating that they have not received Rx with updated medication instructions. Medication was recently increased. However, the pharmacy will not distribute medication with the instruction to take an 80 MG tablet twice daily. The receipt was confirmed with correct dosage on 09/08/20. I confirmed the correct pharmacy with patient's daughter in law ([facility] [phone] [place], [place] - [address]). She is requesting to have the prescription resubmitted. Please assist.

## 2020-09-12 NOTE — Telephone Encounter (Signed)
[facility] Night - Client TELEPHONE ADVICE RECORD AccessNurse Patient Name: Matthew Howard Gender: Male DOB: 1918-10-10 Age: 86 Y 1 M 20 D Return Phone Number: [phone] (Primary), [phone] (Secondary) Address[place] [address] Client [facility] Night - Client Client Site [facility] - Night Physician Tillman Abide- MD Contact Type Call Who Is Calling Patient / Member / Family / Caregiver Call Type Triage / Clinical Caller Name Brandell Maready Relationship To Patient Daughter Return Phone Number [phone] (Secondary) Chief Complaint Knee Injury Reason for Call Symptomatic / Request for Health Information Initial Comment caller states she spoke to the oncall doctor earlier today for Dr. Alphonsus Sias and she's asking for the on call to please call her back as there is a red oval tender shape mass on her father's knee - which is swollen Translation No Nurse Assessment Nurse: Colon Branch, RN, Irving Burton Date/Time (Eastern Time): 09/11/2020 3:07:01 PM Confirm and document reason for call. If symptomatic, describe symptoms. ---Caller states father was seen in ED for cellulitis (Monday), started on Keflex. Fluid moved from his lower leg/calf to knee/ upper knee after elevating his leg. Spoke with on-call MD directly and was told to discontinue leg elevation. Has now developed a red, oval-shaped area on the side of the R inner knee that is warm and tender to touch and when he walks. Wanted to pass this info along as an update in case instruction would changed based on this new development. Does the patient have any new or worsening symptoms? ---Yes Will a triage be completed? ---Yes Related visit to physician within the last 2 weeks? ---Yes Does the PT have any chronic conditions? (i.e. diabetes, asthma, this includes High risk factors for pregnancy, etc.) ---Yes List chronic conditions.  ---diastolic heart failure, gout, BPH, inguinal hernia Is this a behavioral health or substance abuse call? ---No Guidelines Guideline Title Affirmed Question Affirmed Notes Nurse Date/Time [name] Time) Leg Swelling and Edema [1] Thigh or calf pain AND [2] only 1 side AND [3] present > 1 hour Daymon Larsen 09/11/2020 3:11:15 PM PLEASE NOTE: All timestamps contained within this report are represented as [place] Standard Time. CONFIDENTIALTY NOTICE: This fax transmission is intended only for the addressee. It contains information that is legally privileged, confidential or otherwise protected from use or disclosure. If you are not the intended recipient, you are strictly prohibited from reviewing, disclosing, copying using or disseminating any of this information or taking any action in reliance on or regarding this information. If you have received this fax in error, please notify us immediately by telephone so that we can arrange for its return to [place]. Phone: [phone], Toll-Free: [phone], Fax: [phone] Page: 2 of 2 Call Id: [id number] Disp. Time [name] Time) Disposition Final User 09/11/2020 3:17:52 PM Called On-Call Provider Colon Branch, RN, Irving Burton 09/11/2020 3:24:07 PM See HCP within 4 Hours (or PCP triage) Yes Colon Branch, RN, Judie Bonus Disagree/Comply Comply Caller Understands Yes PreDisposition InappropriateToAsk Care Advice Given Per Guideline SEE HCP (OR PCP TRIAGE) WITHIN 4 HOURS: * IF OFFICE WILL BE CLOSED AND NO PCP (PRIMARY CARE PROVIDER) SECOND-LEVEL TRIAGE: You need to be seen within the next 3 or 4 hours. A nearby Urgent Care Center [facility]) is often a good source of care. Another choice is to go to the ED. Go sooner if you become worse. CALL EMS IF: * Chest pain or shortness of breath occurs. CARE ADVICE given per Leg Swelling and Edema (Adult) guideline. Comments User: Irving Burton,  Colon Branch, RN Date/Time (Eastern Time): 09/11/2020 3:25:55 PM Returned call to dtr and  informed of MD recommendation for him to be seen in ED (preferred) or UC. Dtr states she will discuss with him; may wait an hour or two before deciding. Discuss [facility] as site recommended by MD; dtr also mentioned [facility] as a possible location she may take him to. Referrals [facility] - ED [facility] - ED Paging DoctorName Phone DateTime Result/Outcome Message Type Notes Luana Shu- DO [phone] 09/11/2020 3:17:52 PM Called On Call Provider - Reached Doctor Paged Luana Shu- DO 09/11/2020 3:20:29 PM Spoke with On Call - General Message Result Report given to MD. MD recommends pt be seen in ED or UC for evaluation. ED preferred, but may start with UC if family more comfortable with UC as a starting point.

## 2020-09-12 NOTE — Telephone Encounter (Signed)
II will check on him at today's visit to evaluate swelling/?cellulitis

## 2020-09-12 NOTE — Telephone Encounter (Signed)
I will evaluate the swelling and possible infection at today's visit

## 2020-09-12 NOTE — Assessment & Plan Note (Signed)
Overall, his swelling is better. Most of the swelling seems gone with the increase in furosemide. There is concern for gout---but the redness at knee seems outside the joint (but I am not certain). Will try colchicine to see if that helps Any cellulitis seems to be better---can finish the keflex out Continue the increased furosemide

## 2020-09-12 NOTE — Telephone Encounter (Signed)
[facility] Night - Client TELEPHONE ADVICE RECORD AccessNurse Patient Name: Matthew Howard Gender: Male DOB: [birth year] Age: 86 Y 1 M 20 D Return Phone Number: [phone] (Primary), [phone] (Secondary) Address: [place] [address] Client [facility] Night - Client Client Site [facility] - Night Physician Tillman Abide- MD Contact Type Call Who Is Calling Patient / Member / Family / Caregiver Call Type Triage / Clinical Caller Name talib headley Relationship To Patient Daughter Return Phone Number [phone] (Secondary) Chief Complaint Leg Swelling And Edema Reason for Call Request to Speak to a Physician Initial Comment Caller states that her father recently he has cellulitis the fluid has gone to his knee and his upper leg. Translation No Nurse Assessment Nurse: Valentina Lucks, RN, Tempie Hoist Date/Time [name] Time): 09/11/2020 11:45:54 AM Confirm and document reason for call. If symptomatic, describe symptoms. ---Caller states that patient had swelling in calf and was dx with cellulitis. Caller states that she was told to elevated patient's leg. Caller states since elevation, the swelling is in the knee and lower thigh. Caller states patient is thirsty. Caller states no other symptoms. Does the patient have any new or worsening symptoms? ---Yes Will a triage be completed? ---Yes Related visit to physician within the last 2 weeks? ---No Does the PT have any chronic conditions? (i.e. diabetes, asthma, this includes High risk factors for pregnancy, etc.) ---Yes List chronic conditions. ---CHF Is this a behavioral health or substance abuse call? ---No Guidelines Guideline Title Affirmed Question Affirmed Notes Nurse Date/Time [name] Time) Leg Swelling and Edema [1] Thigh, calf, or ankle swelling AND [2] only 1 side Agustin Cree [facility] 09/11/2020 11:47:26 AM Disp.  Time [name] Time) Disposition Final User 09/11/2020 11:52:41 AM Called On-Call Provider Valentina Lucks, RN, Tempie Hoist 09/11/2020 11:50:10 AM See HCP within 4 Hours (or PCP triage) Yes Valentina Lucks, RN, Tempie Hoist PLEASE NOTE: All timestamps contained within this report are represented as [place] Standard Time. CONFIDENTIALTY NOTICE: This fax transmission is intended only for the addressee. It contains information that is legally privileged, confidential or otherwise protected from use or disclosure. If you are not the intended recipient, you are strictly prohibited from reviewing, disclosing, copying using or disseminating any of this information or taking any action in reliance on or regarding this information. If you have received this fax in error, please notify us immediately by telephone so that we can arrange for its return to [place]. Phone: [phone], Toll-Free: [phone], Fax: [phone] Page: 2 of 2 Call Id: [id number] Caller Disagree/Comply Comply Caller Understands Yes PreDisposition Call Doctor Care Advice Given Per Guideline SEE HCP (OR PCP TRIAGE) WITHIN 4 HOURS: * IF OFFICE WILL BE CLOSED AND NO PCP (PRIMARY CARE PROVIDER) SECOND-LEVEL TRIAGE: You need to be seen within the next 3 or 4 hours. A nearby Urgent Care Center [facility]) is often a good source of care. Another choice is to go to the ED. Go sooner if you become worse. CALL EMS IF: * Chest pain or shortness of breath occurs. CARE ADVICE given per Leg Swelling and Edema (Adult) guideline. Referrals GO TO FACILITY UNDECIDED Paging DoctorName Phone DateTime Result/Outcome Message Type Notes Luana Shu- DO [phone] 09/11/2020 11:52:41 AM Called On Call Provider - Reached Doctor Paged Luana Shu- DO 09/11/2020 11:53:09 AM Spoke with On Call - General Message Result On call MD notified that caller wants to speak directly to her. On call MD to contact caller. No further orders.

## 2020-09-12 NOTE — Patient Instructions (Addendum)
Try colchicine 0.6 mg when you get it and consider repeating it 1-2 hours later. If it helps the redness at the right knee, continue for 3-5 days. If it doesn't seem to help in the first day, don't continue.

## 2020-09-12 NOTE — Addendum Note (Signed)
Addended by: Sharin Grave on: 09/12/2020 12:01 PM   Modules accepted: Orders

## 2020-09-12 NOTE — Telephone Encounter (Signed)
Please see other 09/08/20 and 09/09/20 phone notes. Carollee Herter CMA has already scheduled appt for pt 09/12/20 at 1:45.

## 2020-09-12 NOTE — Telephone Encounter (Signed)
RX for Furosemide 80 mg BID resubmitted to [facility] [address] as requested.

## 2020-09-14 ENCOUNTER — Ambulatory Visit: Payer: Medicare Other | Admitting: Cardiology

## 2020-09-14 ENCOUNTER — Telehealth: Payer: Self-pay | Admitting: Internal Medicine

## 2020-09-14 NOTE — Telephone Encounter (Signed)
I am sure I discussed this at the visit---but to clarify, they can try colchicine twice a day (should have been yesterday) --but only continue if it seemed to help that area on the inside of his knee (I really didn't think it was the gout)

## 2020-09-14 NOTE — Telephone Encounter (Signed)
Pt daughter called in wanted to know about the dosage for colchicine

## 2020-09-14 NOTE — Telephone Encounter (Signed)
Spoke to [employer]. She said it seems to be helping the area. She could not remember how many days Dr Alphonsus Sias said to take it. She does not want to give him too much due to his kidneys but she wants to make sure he gets enough to help it. She gave him 1.5 tabs today because she was worried that it may be too much medication. I re-interated what Dr Alphonsus Sias said. I advised her it would tomorrow afternoon before I get back with her.

## 2020-09-15 NOTE — Telephone Encounter (Signed)
If it is better now---I would continue the colchicine daily for the next 5 days or so---and then stop it. It can be used bid prn in the future if there is any place that seems like it might be the gout

## 2020-09-15 NOTE — Telephone Encounter (Signed)
Spoke to [employer], dil. Advised her of Dr Karle Starch note.

## 2020-09-16 ENCOUNTER — Other Ambulatory Visit: Payer: Self-pay

## 2020-09-16 ENCOUNTER — Other Ambulatory Visit: Payer: Medicare Other | Admitting: *Deleted

## 2020-09-16 DIAGNOSIS — Z79899 Other long term (current) drug therapy: Secondary | ICD-10-CM

## 2020-09-16 DIAGNOSIS — I5032 Chronic diastolic (congestive) heart failure: Secondary | ICD-10-CM

## 2020-09-17 LAB — BASIC METABOLIC PANEL
BUN/Creatinine Ratio: 21 (ref 10–24)
BUN: 32 mg/dL (ref 10–36)
CO2: 31 mmol/L — ABNORMAL HIGH (ref 20–29)
Calcium: 8.7 mg/dL (ref 8.6–10.2)
Chloride: 94 mmol/L — ABNORMAL LOW (ref 96–106)
Creatinine, Ser: 1.54 mg/dL — ABNORMAL HIGH (ref 0.76–1.27)
GFR calc Af Amer: 42 mL/min/{1.73_m2} — ABNORMAL LOW (ref >59)
GFR calc non Af Amer: 37 mL/min/{1.73_m2} — ABNORMAL LOW (ref >59)
Glucose: 116 mg/dL — ABNORMAL HIGH (ref 65–99)
Potassium: 4.2 mmol/L (ref 3.5–5.2)
Sodium: 140 mmol/L (ref 134–144)

## 2020-09-19 ENCOUNTER — Other Ambulatory Visit: Payer: Self-pay

## 2020-09-19 DIAGNOSIS — I480 Paroxysmal atrial fibrillation: Secondary | ICD-10-CM

## 2020-09-19 NOTE — Telephone Encounter (Signed)
-----   Message from Jake Bathe, MD sent at 09/18/2020  7:58 AM EST ----- Creatinine remains at 1.54 from 1.5 . One year ago was 1.32.  Continue with lasix and repeat BMET in one month to plot creatinine.   [name], MD

## 2020-10-04 ENCOUNTER — Other Ambulatory Visit: Payer: Self-pay | Admitting: Internal Medicine

## 2020-10-05 ENCOUNTER — Ambulatory Visit: Payer: Medicare Other | Admitting: Neurology

## 2020-10-13 ENCOUNTER — Encounter: Payer: Self-pay | Admitting: Internal Medicine

## 2020-10-13 ENCOUNTER — Telehealth: Payer: Self-pay

## 2020-10-13 ENCOUNTER — Other Ambulatory Visit: Payer: Self-pay

## 2020-10-13 ENCOUNTER — Ambulatory Visit: Payer: Medicare Other | Admitting: Internal Medicine

## 2020-10-13 DIAGNOSIS — M10062 Idiopathic gout, left knee: Secondary | ICD-10-CM

## 2020-10-13 DIAGNOSIS — M109 Gout, unspecified: Secondary | ICD-10-CM | POA: Insufficient documentation

## 2020-10-13 MED ORDER — PREDNISONE 20 MG PO TABS: 40.0000 mg | ORAL_TABLET | Freq: Every day | ORAL | 0 refills | Status: DC

## 2020-10-13 NOTE — Assessment & Plan Note (Signed)
Not clear cut but nothing to suggest celllulitis, DVT, etc Findings are not striking now--but apparently were worse this morning before the colchicine

## 2020-10-13 NOTE — Telephone Encounter (Signed)
If just in the knee--might be gout, but best if I look at it

## 2020-10-13 NOTE — Progress Notes (Signed)
Subjective:    Patient ID: Matthew Howard, male    DOB: 03/08/1919, 87 y.o.   MRN: [id number]  HPI Here with dauighter due to left knee pain This visit occurred during the SARS-CoV-2 public health emergency.  Safety protocols were in place, including screening questions prior to the visit, additional usage of staff PPE, and extensive cleaning of exam room while observing appropriate contact time as indicated for disinfecting solutions.   Woke with sore knee this morning They tried colchicine--no clear help It has been hot and swollen Not really red  Current Outpatient Medications on File Prior to Visit  Medication Sig Dispense Refill  . aspirin 81 MG EC tablet Take 81 mg by mouth daily.    . Cholecalciferol (VITAMIN D) 50 MCG (2000 UT) tablet Take 2,000 Units by mouth daily.     . colchicine 0.6 MG tablet TAKE 1 TABLET (0.6 MG TOTAL) BY MOUTH 2 (TWO) TIMES DAILY AS NEEDED. 60 tablet 0  . Cyanocobalamin 2500 MCG TABS Take 2,500 mcg by mouth daily.     Marland Kitchen donepezil (ARICEPT) 5 MG tablet Take 5 mg by mouth daily.     . finasteride (PROSCAR) 5 MG tablet TAKE 1 TABLET BY MOUTH DAILY 90 tablet 3  . furosemide (LASIX) 80 MG tablet Take 1 tablet (80 mg total) by mouth 2 (two) times daily. 60 tablet 6  . melatonin 5 MG TABS Take 5 mg by mouth at bedtime.    . polyethylene glycol (MIRALAX / GLYCOLAX) 17 g packet Take 17 g by mouth daily.    . Tamsulosin HCl (FLOMAX) 0.4 MG CAPS Take 1 capsule (0.4 mg total) by mouth daily. 90 capsule 1   No current facility-administered medications on file prior to visit.    No Known Allergies  Past Medical History:  Diagnosis Date  . Advance directive discussed with patient 07/15/2015  . Atrial fibrillation (HCC) 01/23/2011  . Atrial flutter (HCC)    not well documented  . Benign prostatic hyperplasia (BPH) with urinary urgency 09/11/2007   Qualifier: Diagnosis of  By: Alphonsus Sias MD, Ronnette Hila   . Benign prostatic hypertrophy   . Gout   . Gout  09/11/2007   Qualifier: Diagnosis of  By: Alphonsus Sias MD, Ronnette Hila   . Hyperlipidemia   . Left inguinal hernia 03/29/2020  . Mobitz type II atrioventricular block 01/25/2014  . Pacemaker 04/27/2014  . Pedal edema 09/10/2016  . Routine general medical examination at a health care facility 07/01/2013  . Scrotal right inuinal hernia 08/13/2019  . Symptomatic bradycardia    s/p dual chamber PPM implantation April 2015 [facility])    Past Surgical History:  Procedure Laterality Date  . CATARACT EXTRACTION    . PACEMAKER INSERTION  01-25-2014   STJ Assurity dual chamber pacemaker implanted by Dr [name] for symptomatic bradycardia  . PERMANENT PACEMAKER INSERTION N/A 01/25/2014   Procedure: PERMANENT PACEMAKER INSERTION;  Surgeon: Marinus Maw, MD;  Location: [facility] CATH LAB;  Service: Cardiovascular;  Laterality: N/A;  . TONSILLECTOMY      Family History  Problem Relation Age of Onset  . Cancer Mother   . Throat cancer Father     Social History   Socioeconomic History  . Marital status: Widowed    Spouse name: Jarold Macomber  . Number of children: 4  . Years of education: Not on file  . Highest education level: Not on file  Occupational History  . Occupation: retired    Comment: Dispensing optician  Tobacco Use  . Smoking status: Never Smoker  . Smokeless tobacco: Never Used  Vaping Use  . Vaping Use: Never used  Substance and Sexual Activity  . Alcohol use: Yes    Comment: rare etoh use  . Drug use: No  . Sexual activity: Not on file  Other Topics Concern  . Not on file  Social History Narrative   Widowed 4/15   No living will   Health care power of attorney is son [name]. Daughter [name] is financial POA    He feels he is ready for DNR---form done 08/13/19 (but he isn't sure he will put it up)   No tube feeds if cognitively unaware   Social Determinants of Health   Financial Resource Strain: Not on file  Food Insecurity: Not on file  Transportation Needs: Not on file  Physical  Activity: Not on file  Stress: Not on file  Social Connections: Not on file  Intimate Partner Violence: Not on file   Review of Systems  No fever No nausea and eating okay     Objective:   Physical Exam Musculoskeletal:     Comments: Medial left knee has some swelling--not clearly synovitis Is warm but not really red or tender now (was tender this morning per daughter) Foot, ankle and calf all negative            Assessment & Plan:

## 2020-10-13 NOTE — Telephone Encounter (Signed)
Matthew Howard notified as instructed and voiced understanding; [name] spoke with her dad and they agree want to see Dr Alphonsus Sias; pt fell earlier today and no known injury; EMS came and got pt up. The redness and warmth is still in the knee. Pt scheduled today in office with Dr Alphonsus Sias at 4:15. No covid symptoms.

## 2020-10-13 NOTE — Telephone Encounter (Signed)
I spoke with [name] (DPR signed); she gave pt a colchicine and pain is improved. Pt ate a lot of cookies on 10/13/19. No CP or SOB. Pt does have redness and swelling in lt knee and lower leg. UC & ED precautions given. [name] does not think blood clot or cellulitis. No available appts at [facility]. [name] said she will be with him all day and request cb after Dr Alphonsus Sias reviews note. Dr Alphonsus Sias is not in office until this after noon. Sending note to Dr Audery Amel NP who is in office and [facility] CMA.

## 2020-10-13 NOTE — Telephone Encounter (Signed)
[facility] Night - Client TELEPHONE ADVICE RECORD AccessNurse Patient Name: Matthew Howard Gender: Male DOB: [birth year] Age: 87 Y 2 M 22 D Return Phone Number: [phone] (Primary), [phone] (Secondary), [phone] (Alternate) Address: [place]  [address] Client [facility] Night - Client Client Site [facility] Night Physician Tillman Abide- MD Contact Type Call Who Is Calling Patient / Member / Family / Caregiver Call Type Triage / Clinical Caller Name Matthew Howard Relationship To Patient Daughter Return Phone Number [phone] (Primary) Chief Complaint Leg Pain Reason for Call Symptomatic / Request for Health Information Initial Comment Caller states her father has pain in his knee and left leg. Leg is hot and swollen. Translation No Nurse Assessment Nurse: Viviann Spare, RN, Tia Date/Time (Eastern Time): 10/13/2020 6:35:17 AM Confirm and document reason for call. If symptomatic, describe symptoms. ---Caller states her father has pain in his knee and left leg. Leg is hot and swollen. Does the patient have any new or worsening symptoms? ---Yes Will a triage be completed? ---Yes Related visit to physician within the last 2 weeks? ---No Does the PT have any chronic conditions? (i.e. diabetes, asthma, this includes High risk factors for pregnancy, etc.) ---Yes List chronic conditions. ---CHF Is this a behavioral health or substance abuse call? ---No Guidelines Guideline Title Affirmed Question Affirmed Notes Nurse Date/Time (Eastern Time) Leg Pain [1] Red area or streak AND [2] large (> 2 in. or 5 cm) Viviann Spare, RN, Tia 10/13/2020 6:35:50 AM Disp. Time Matthew Howard Time) Disposition Final User 10/13/2020 6:40:58 AM See HCP within 4 Hours (or PCP triage) Yes Viviann Spare, RN, Tia Caller Disagree/Comply Comply PLEASE NOTE: All timestamps contained within this report are represented as [place]  Standard Time. CONFIDENTIALTY NOTICE: This fax transmission is intended only for the addressee. It contains information that is legally privileged, confidential or otherwise protected from use or disclosure. If you are not the intended recipient, you are strictly prohibited from reviewing, disclosing, copying using or disseminating any of this information or taking any action in reliance on or regarding this information. If you have received this fax in error, please notify us immediately by telephone so that we can arrange for its return to [place]. Phone: [phone], Toll-Free: [phone], Fax: [phone] Page: 2 of 2 Call Id: [id number] Caller Understands Yes PreDisposition Did not know what to do Care Advice Given Per Guideline SEE HCP (OR PCP TRIAGE) WITHIN 4 HOURS: * IF OFFICE WILL BE OPEN: You need to be seen within the next 3 or 4 hours. Call your doctor (or NP/PA) now or as soon as the office opens. * IF OFFICE WILL BE CLOSED AND NO PCP (PRIMARY CARE PROVIDER) SECOND-LEVEL TRIAGE: You need to be seen within the next 3 or 4 hours. A nearby Urgent Care Center [facility]) is often a good source of care. Another choice is to go to the ED. Go sooner if you become worse. CALL BACK IF: * You become worse CARE ADVICE given per Leg Pain (Adult) guideline. Comments User: Criss Alvine, RN Date/Time Matthew Howard Time): 10/13/2020 6:38:26 AM history of gout refused to rate his pain Referrals REFERRED TO PCP OFFICE [facility]

## 2020-10-13 NOTE — Telephone Encounter (Signed)
If the swelling and redness is better---and was mostly in a joint (knee, ankle, foot)---it likely is gout. If it is in the leg itself--I would be more concerned about infection I could see him at 4:15 if they can bring him in

## 2020-10-18 ENCOUNTER — Telehealth: Payer: Self-pay | Admitting: Cardiology

## 2020-10-18 ENCOUNTER — Other Ambulatory Visit: Payer: Medicare Other

## 2020-10-18 NOTE — Telephone Encounter (Signed)
Pt c/o Syncope: STAT if syncope occurred within 30 minutes and pt complains of lightheadedness High Priority if episode of passing out, completely, today or in last 24 hours   1. Did you pass out today? Yes and 3 days ago   2. When is the last time you passed out? This morning in the shower   3. Has this occurred multiple times? yes   4. Did you have any symptoms prior to passing out?  She did not know- she said pt need an appt asap- no available appts with Dr Anne Fu or APP

## 2020-10-18 NOTE — Telephone Encounter (Signed)
Called daughter in law and left message to call back

## 2020-10-18 NOTE — Telephone Encounter (Signed)
LMOM to call DC with hours and contact #. Need Home

## 2020-10-18 NOTE — Telephone Encounter (Signed)
New Message:   She said pt passed out in the shower this morning between 7:00 and 8:00 . She wants to know what showed up or if anything showed up when this happen?

## 2020-10-19 ENCOUNTER — Ambulatory Visit: Payer: Medicare Other | Admitting: Internal Medicine

## 2020-10-19 NOTE — Telephone Encounter (Signed)
Please see other telephone from same day for further documentation.

## 2020-10-19 NOTE — Telephone Encounter (Signed)
Pt c/o Syncope: STAT if syncope occurred within 30 minutes and pt complains of lightheadedness High Priority if episode of passing out, completely, today or in last 24 hours   1. Did you pass out today? no  2. When is the last time you passed out? Passed out 10/13/20 and then 10/18/20 between 7:00am - 7:15am  3. Has this occurred multiple times? Does not have a  history of this  4. Did you have any symptoms prior to passing out? Not that she is aware of. She would like the patient to be seen sooner than the appt I scheduled for her on 11/08/20. I offered to check to see if one of the physician assistants have anything available and she declined.     Would like someone to look at his transmissions from his device.  You can leave a VM if she does not answer.

## 2020-10-19 NOTE — Telephone Encounter (Signed)
I will send this message to both Orlie Dakin RN for Dr. Anne Fu in regards to appt. Will also send to device in regards to pt request someone to look at transmissions.

## 2020-10-19 NOTE — Telephone Encounter (Signed)
Dr Anne Fu reviewed this information and pt's chart.  Orders given to d/c Furosemide, increase PO intake (gatorade), f/u with PCP for lab work [facility]). Called to review with daughter as pt is very [facility] -  Caregiver stated she is not there and will not be back today.  Son will be coming in this evening and she will leave him a note to call the office tomorrow for the instructions.  Advised caregiver this is extremely important that we speak with a family member regarding these orders.

## 2020-10-19 NOTE — Telephone Encounter (Signed)
Remote transmission received that showed device function WNL,presenting EGM was AF / VP , hx of AF, no OAC . No events or episodes recorded other than the ongoing AF. Daughter reports patient was in the BR  and stood up prior to syncopal episode and was in the shower when the other syncopal episode occurred. EMS was called after both syncopal episodes and patient declined transport to hospital. Difficult to assess patient because he is profoundly HOH. Daughter reports EMS reported BP was "low" and she could only recall DBP of 40. Patient currently takes Lasix 80 mg BID. Instructed to record BP and keep record and to contact PCP concerning syncope. Will forward to Dr Anne Fu for review.ED precautions for CP, SOB,chest pressure, and  syncope given to family and patient.

## 2020-10-19 NOTE — Telephone Encounter (Signed)
2 nd attempt to reach Daughter Gavin Pound. LMOM to call [facility]. Phone # and office hours provided. Need remote transmission and has to be sent manually.  LM at patient's home to call office and # provided.

## 2020-10-19 NOTE — Telephone Encounter (Signed)
I let the patient daughter know we need a manual transmission. I told her verbal instructions on how to send it. The monitor is hooked up to a landline and the phone line can not be in use while sending a transmission. I told her once the transmission is sent to call us back. The nurse will review it and speak with her then.

## 2020-10-19 NOTE — Telephone Encounter (Signed)
Discussed.  Agree with plan.  We will stop his furosemide.  His syncopal episodes are compatible with overdiuresis.  [name], MD

## 2020-10-20 NOTE — Telephone Encounter (Signed)
Called the pts home number and Son Sharrod answered.  Son is on [place].  Endorsed to the pts Son that per Dr Anne Fu, he wants the pt to stop taking furosemide, for his syncopal episodes are compatible with over diuresis. Advised the pts Son to have the pt follow all recommendations Dr. Anne Fu Nurse Avie Arenas RN, provided to them at yesterdays phone conversation.   Son verbalized understanding and agrees with this plan.  Son was more than gracious for all the assistance provided and states he will follow all recommendations outlined to him from yesterday and today's phone conversation.

## 2020-10-24 ENCOUNTER — Ambulatory Visit (INDEPENDENT_AMBULATORY_CARE_PROVIDER_SITE_OTHER): Payer: Medicare Other

## 2020-10-24 DIAGNOSIS — I441 Atrioventricular block, second degree: Secondary | ICD-10-CM

## 2020-10-25 ENCOUNTER — Other Ambulatory Visit: Payer: Medicare Other

## 2020-10-27 ENCOUNTER — Other Ambulatory Visit: Payer: Self-pay

## 2020-10-27 ENCOUNTER — Other Ambulatory Visit: Payer: Medicare Other | Admitting: *Deleted

## 2020-10-27 DIAGNOSIS — I480 Paroxysmal atrial fibrillation: Secondary | ICD-10-CM

## 2020-10-27 LAB — CUP PACEART REMOTE DEVICE CHECK
Battery Remaining Longevity: 68 mo
Battery Remaining Percentage: 64 %
Battery Voltage: 2.9 V
Brady Statistic AP VP Percent: 0 %
Brady Statistic AP VS Percent: 0 %
Brady Statistic AS VP Percent: 0 %
Brady Statistic AS VS Percent: 0 %
Brady Statistic RA Percent Paced: 0 %
Brady Statistic RV Percent Paced: 98 %
Date Time Interrogation Session: 20220117101158
Implantable Lead Implant Date: 20150420
Implantable Lead Implant Date: 20150420
Implantable Lead Location: 753859
Implantable Lead Location: 753860
Implantable Pulse Generator Implant Date: 20150420
Lead Channel Impedance Value: 400 Ohm
Lead Channel Impedance Value: 440 Ohm
Lead Channel Pacing Threshold Amplitude: 0.5 V
Lead Channel Pacing Threshold Amplitude: 1 V
Lead Channel Pacing Threshold Pulse Width: 0.4 ms
Lead Channel Pacing Threshold Pulse Width: 0.4 ms
Lead Channel Sensing Intrinsic Amplitude: 10.2 mV
Lead Channel Sensing Intrinsic Amplitude: 2.5 mV
Lead Channel Setting Pacing Amplitude: 2 V
Lead Channel Setting Pacing Amplitude: 2.5 V
Lead Channel Setting Pacing Pulse Width: 0.4 ms
Lead Channel Setting Sensing Sensitivity: 2 mV
Pulse Gen Model: 2240
Pulse Gen Serial Number: 3013717

## 2020-10-27 LAB — BASIC METABOLIC PANEL
BUN/Creatinine Ratio: 18 (ref 10–24)
BUN: 26 mg/dL (ref 10–36)
CO2: 26 mmol/L (ref 20–29)
Calcium: 8.9 mg/dL (ref 8.6–10.2)
Chloride: 104 mmol/L (ref 96–106)
Creatinine, Ser: 1.48 mg/dL — ABNORMAL HIGH (ref 0.76–1.27)
GFR calc Af Amer: 45 mL/min/{1.73_m2} — ABNORMAL LOW (ref >59)
GFR calc non Af Amer: 39 mL/min/{1.73_m2} — ABNORMAL LOW (ref >59)
Glucose: 88 mg/dL (ref 65–99)
Potassium: 4.9 mmol/L (ref 3.5–5.2)
Sodium: 145 mmol/L — ABNORMAL HIGH (ref 134–144)

## 2020-10-29 ENCOUNTER — Other Ambulatory Visit: Payer: Self-pay | Admitting: Internal Medicine

## 2020-11-07 NOTE — Progress Notes (Signed)
Remote pacemaker transmission.   

## 2020-11-08 ENCOUNTER — Ambulatory Visit: Payer: Medicare Other | Admitting: Cardiology

## 2020-11-21 ENCOUNTER — Other Ambulatory Visit: Payer: Self-pay | Admitting: Internal Medicine

## 2020-12-21 ENCOUNTER — Telehealth: Payer: Self-pay

## 2020-12-21 NOTE — Telephone Encounter (Signed)
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 Pt has gout attacks q 3 - 4 wks. Matthew Howard said pt is taking high dose of furosemide which can cause increased levels of uric acid and when pt gets over this episode of gout Matthew Howard is requesting allopurinol to prevent the gout attacks. Pt has previously had gout in knee, hand or foot. [facility] request cb after reviewed by Dr Alphonsus Sias.

## 2020-12-22 MED ORDER — ALLOPURINOL 100 MG PO TABS: 200.0000 mg | ORAL_TABLET | Freq: Every day | ORAL | 11 refills | Status: DC

## 2020-12-22 NOTE — Telephone Encounter (Signed)
Spoke to [employer]. Sent rx to [facility] [address]

## 2020-12-22 NOTE — Telephone Encounter (Signed)
Okay to send Rx for allopurinol 100mg ---- 2 tabs daily (#60 x 11) to start when current flare is over. I will check labs at his next visit

## 2021-01-10 ENCOUNTER — Telehealth: Payer: Self-pay | Admitting: Internal Medicine

## 2021-01-10 MED ORDER — PREDNISONE 20 MG PO TABS
40.0000 mg | ORAL_TABLET | Freq: Every day | ORAL | 0 refills | Status: DC
Start: 2021-01-10 — End: 2021-05-05

## 2021-01-10 MED ORDER — PREDNISONE 20 MG PO TABS: ORAL_TABLET | ORAL | 0 refills | Status: DC

## 2021-01-10 NOTE — Addendum Note (Signed)
Addended by: Eual Fines on: 01/10/2021 02:14 PM   Modules accepted: Orders

## 2021-01-10 NOTE — Telephone Encounter (Signed)
Spoke to DIL, [employer]. She is pretty sure it is gout. Wants to try the prednisone and will discuss other options at his upcoming appt in a few weeks.

## 2021-01-10 NOTE — Telephone Encounter (Signed)
Patients daughter called in about him having Gout in his thumb medication colchicine 4.2 mg over 6 days .Crcl  30-40 mil per minute  .  His thumb is still swollen and they are wanting to try prednisone or continue him other meds.   They are wanting a call back to discuss options

## 2021-01-10 NOTE — Telephone Encounter (Signed)
If they are sure it is gout (not typical just in a thumb), then we can try prednisone 20mg  ---- 2 daily for 3 days, then 1 daily for 3 days. I can add him on tomorrow at the end of my morning to check him to be sure if they can bring him in

## 2021-01-11 NOTE — Progress Notes (Signed)
Cardiology Office Note:    Date:  01/12/2021   ID:  [name], DOB Dec 08, 1918, MRN [id number]  PCP:  [name], MD   [facility]  Cardiologist:  [name], MD  Advanced Practice Provider:  No care team member to display Electrophysiologist:  None       Referring MD: [name], MD     History of Present Illness:    Matthew Howard is a 87 y.o. male here for chronic diastolic heart failure follow-up, lower extremity edema, pacemaker.  I saw him back in December 2021 where we continued with Lasix therapy.  He ended up having a syncopal episode in January which could have been related to overdiuresis.  Lasix was recommended to be stopped.  He has had lower extremity edema in the past with 10 pound weight gain.  No evidence of DVT on Doppler.  Dr. [name] has been following his pacemaker, atrial fibrillation noted.  His pacemaker is not MRI compatible.  [employer] 2015.  His daughter is a Teacher, early years/pre.  Overall looks much better than previously.  He does have some inflammation on his thumb.  His knees and legs look much better.  He is back to taking Lasix 80 mg twice a day or a total of 160 daily.  No chest pain no shortness of breath.  Occasionally he will get wheezing and his daughter will give him an additional Lasix to help him with this.  Past Medical History:  Diagnosis Date  . Advance directive discussed with patient 07/15/2015  . Atrial fibrillation (HCC) 01/23/2011  . Atrial flutter (HCC)    not well documented  . Benign prostatic hyperplasia (BPH) with urinary urgency 09/11/2007   Qualifier: Diagnosis of  By: Alphonsus Sias MD, Ronnette Hila   . Benign prostatic hypertrophy   . Gout   . Gout 09/11/2007   Qualifier: Diagnosis of  By: Alphonsus Sias MD, Ronnette Hila   . Hyperlipidemia   . Left inguinal hernia 03/29/2020  . Mobitz type II atrioventricular block 01/25/2014  . Pacemaker 04/27/2014  . Pedal edema 09/10/2016  . Routine general medical  examination at a health care facility 07/01/2013  . Scrotal right inuinal hernia 08/13/2019  . Symptomatic bradycardia    s/p dual chamber PPM implantation April 2015 [facility] [employer])    Past Surgical History:  Procedure Laterality Date  . CATARACT EXTRACTION    . PACEMAKER INSERTION  01-25-2014   STJ Assurity dual chamber pacemaker implanted by Dr [name] for symptomatic bradycardia  . PERMANENT PACEMAKER INSERTION N/A 01/25/2014   Procedure: PERMANENT PACEMAKER INSERTION;  Surgeon: Marinus Maw, MD;  Location: [facility] CATH LAB;  Service: Cardiovascular;  Laterality: N/A;  . TONSILLECTOMY      Current Medications: Current Meds  Medication Sig  . allopurinol (ZYLOPRIM) 100 MG tablet Take 2 tablets (200 mg total) by mouth daily.  Marland Kitchen aspirin 81 MG EC tablet Take 81 mg by mouth daily.  . Cholecalciferol (VITAMIN D) 50 MCG (2000 UT) tablet Take 2,000 Units by mouth daily.   . colchicine 0.6 MG tablet TAKE 1 TABLET (0.6 MG TOTAL) BY MOUTH 2 (TWO) TIMES DAILY AS NEEDED.  Marland Kitchen Cyanocobalamin 2500 MCG TABS Take 2,500 mcg by mouth daily.   Marland Kitchen donepezil (ARICEPT) 5 MG tablet Take 5 mg by mouth daily.   . finasteride (PROSCAR) 5 MG tablet TAKE 1 TABLET BY MOUTH DAILY  . furosemide (LASIX) 80 MG tablet Take 80 mg by mouth 2 (two) times  daily. One tablet twice a day  . melatonin 5 MG TABS Take 5 mg by mouth at bedtime.  . polyethylene glycol (MIRALAX / GLYCOLAX) 17 g packet Take 17 g by mouth daily.  . predniSONE (DELTASONE) 20 MG tablet Take 2 tablets (40 mg total) by mouth daily. For 3 days, then 1 tab daily for 3 days     Allergies:   Patient has no known allergies.   Social History   Socioeconomic History  . Marital status: Widowed    Spouse name: Serigne Kubicek  . Number of children: 4  . Years of education: Not on file  . Highest education level: Not on file  Occupational History  . Occupation: retired    Comment: [employer]  Tobacco Use  . Smoking status: Never Smoker  . Smokeless  tobacco: Never Used  Vaping Use  . Vaping Use: Never used  Substance and Sexual Activity  . Alcohol use: Yes    Comment: rare etoh use  . Drug use: No  . Sexual activity: Not on file  Other Topics Concern  . Not on file  Social History Narrative   Widowed 4/15   No living will   Health care power of attorney is son [name]. Daughter [name] is financial POA    He feels he is ready for DNR---form done 08/13/19 (but he isn't sure he will put it up)   No tube feeds if cognitively unaware   Social Determinants of Health   Financial Resource Strain: Not on file  Food Insecurity: Not on file  Transportation Needs: Not on file  Physical Activity: Not on file  Stress: Not on file  Social Connections: Not on file     Family History: The patient's family history includes Cancer in his mother; Throat cancer in his father.  ROS:   Please see the history of present illness.    No further syncope, occasional wheezing shortness of breath noted.  Occasional edema of lower extremities.  Still has gouty thumb.  All other systems reviewed and are negative.  EKGs/Labs/Other Studies Reviewed:     Recent Labs: 06/21/2020: Pro B Natriuretic peptide (BNP) 213.0 09/05/2020: ALT 18; B Natriuretic Peptide 296.3; Hemoglobin 9.8; Magnesium 2.4; Platelets 131 10/27/2020: BUN 26; Creatinine, Ser 1.48; Potassium 4.9; Sodium 145  Recent Lipid Panel    Component Value Date/Time   CHOL 141 11/01/2008 1534   TRIG 104 11/01/2008 1534   HDL 43.4 11/01/2008 1534   CHOLHDL 3.2 CALC 11/01/2008 1534   VLDL 21 11/01/2008 1534   LDLCALC 77 11/01/2008 1534     Risk Assessment/Calculations:      Physical Exam:    VS:  BP (!) 120/40 (BP Location: Left Arm, Patient Position: Sitting, Cuff Size: Normal)   Pulse 70   Ht 5' 11.5" (1.816 m)   Wt 175 lb (79.4 kg)   SpO2 96%   BMI 24.07 kg/m     Wt Readings from Last 3 Encounters:  01/12/21 175 lb (79.4 kg)  10/13/20 171 lb 1.9 oz (77.6 kg)  09/12/20 179 lb  (81.2 kg)     GEN: Elderly well nourished, well developed in no acute distress HEENT: Normal NECK: No JVD; No carotid bruits LYMPHATICS: No lymphadenopathy CARDIAC: RRR, no murmurs, rubs, gallops RESPIRATORY:  Clear to auscultation without rales, wheezing or rhonchi  ABDOMEN: Soft, non-tender, non-distended MUSCULOSKELETAL:  No edema; No deformity  SKIN: Warm and dry NEUROLOGIC:  Alert and oriented x 3 PSYCHIATRIC:  Normal affect   ASSESSMENT:  1. Chronic diastolic heart failure (HCC)   2. Medication management   3. Idiopathic gout, unspecified chronicity, unspecified site   4. Chronic atrial fibrillation (HCC)    PLAN:    In order of problems listed above:  Left lower extremity edema with previous cellulitis -Previously received antibiotics from the emergency department. -Lasix previously 40 mg twice a day did not seem to help and I increase this to 80 mg twice a day.  He then had a syncopal episode.  Likely related to overdiuresis.  However his tamsulosin was also stopped and this seemed to help.  His daughter, pharmacist, decided to start him back on the Lasix and now has him on 80 mg twice a day.  She is adjusting medications. -I previously reassured him that isolated lower extremity edema in 1 extremity is not usually secondary to heart failure.  Lower extremity Dopplers were negative for DVT. -He was previously sent concerned about gout therefore colchicine was utilized. -His creatinine remained stable at approximately 1.5 in December. -Prednisone short course has been utilized in case this is gout.  May need to dose adjust allopurinol.  Pacemaker -Operating well.  No issues today.  Continue to be monitored by Dr. [name].  CKD stage 3b --CMET, CBC  Gout --Daughter asked to check a serum uric acid level.  We will comply.  Treatment per Dr. Alphonsus Sias.  Currently on prednisone.  Blood glucose may be elevated.  6 mth follow-up    Medication Adjustments/Labs and Tests  Ordered: Current medicines are reviewed at length with the patient today.  Concerns regarding medicines are outlined above.  Orders Placed This Encounter  Procedures  . Comprehensive metabolic panel  . CBC  . Magnesium  . Uric acid   No orders of the defined types were placed in this encounter.   Patient Instructions  Medication Instructions:  The current medical regimen is effective;  continue present plan and medications.  *If you need a refill on your cardiac medications before your next appointment, please call your pharmacy*  Lab Work: Please have blood work today (CMP, CBC, MG and Uric Acid)  If you have labs (blood work) drawn today and your tests are completely normal, you will receive your results only by: Marland Kitchen MyChart Message (if you have MyChart) OR . A paper copy in the mail If you have any lab test that is abnormal or we need to change your treatment, we will call you to review the results.  Follow-Up: At [facility], you and your health needs are our priority.  As part of our continuing mission to provide you with exceptional heart care, we have created designated Provider Care Teams.  These Care Teams include your primary Cardiologist (physician) and Advanced Practice Providers (APPs -  Physician Assistants and Nurse Practitioners) who all work together to provide you with the care you need, when you need it.  We recommend signing up for the patient portal called "MyChart".  Sign up information is provided on this After Visit Summary.  MyChart is used to connect with patients for Virtual Visits (Telemedicine).  Patients are able to view lab/test results, encounter notes, upcoming appointments, etc.  Non-urgent messages can be sent to your provider as well.   To learn more about what you can do with MyChart, go to [email or web address].    Your next appointment:   6 month(s)  The format for your next appointment:   In Person  Provider:   [name],  MD   Thank you  for choosing [facility]!!        Signed, Donato SchultzMark Kia Varnadore, MD  01/12/2021 2:44 PM    [facility]

## 2021-01-12 ENCOUNTER — Ambulatory Visit: Payer: Medicare Other | Admitting: Cardiology

## 2021-01-12 ENCOUNTER — Other Ambulatory Visit: Payer: Self-pay

## 2021-01-12 ENCOUNTER — Encounter: Payer: Self-pay | Admitting: Cardiology

## 2021-01-12 VITALS — BP 120/40 | HR 70 | Ht 71.5 in | Wt 175.0 lb

## 2021-01-12 DIAGNOSIS — M1000 Idiopathic gout, unspecified site: Secondary | ICD-10-CM | POA: Diagnosis not present

## 2021-01-12 DIAGNOSIS — Z79899 Other long term (current) drug therapy: Secondary | ICD-10-CM

## 2021-01-12 DIAGNOSIS — I482 Chronic atrial fibrillation, unspecified: Secondary | ICD-10-CM

## 2021-01-12 DIAGNOSIS — I5032 Chronic diastolic (congestive) heart failure: Secondary | ICD-10-CM | POA: Diagnosis not present

## 2021-01-12 NOTE — Patient Instructions (Signed)
Medication Instructions:  The current medical regimen is effective;  continue present plan and medications.  *If you need a refill on your cardiac medications before your next appointment, please call your pharmacy*  Lab Work: Please have blood work today (CMP, CBC, MG and Uric Acid)  If you have labs (blood work) drawn today and your tests are completely normal, you will receive your results only by: Marland Kitchen MyChart Message (if you have MyChart) OR . A paper copy in the mail If you have any lab test that is abnormal or we need to change your treatment, we will call you to review the results.  Follow-Up: At [facility], you and your health needs are our priority.  As part of our continuing mission to provide you with exceptional heart care, we have created designated Provider Care Teams.  These Care Teams include your primary Cardiologist (physician) and Advanced Practice Providers (APPs -  Physician Assistants and Nurse Practitioners) who all work together to provide you with the care you need, when you need it.  We recommend signing up for the patient portal called "MyChart".  Sign up information is provided on this After Visit Summary.  MyChart is used to connect with patients for Virtual Visits (Telemedicine).  Patients are able to view lab/test results, encounter notes, upcoming appointments, etc.  Non-urgent messages can be sent to your provider as well.   To learn more about what you can do with MyChart, go to [email or web address].    Your next appointment:   6 month(s)  The format for your next appointment:   In Person  Provider:   [name], MD   Thank you for choosing [facility]!!

## 2021-01-13 LAB — CBC
Hematocrit: 35.7 % — ABNORMAL LOW (ref 37.5–51.0)
Hemoglobin: 11.6 g/dL — ABNORMAL LOW (ref 13.0–17.7)
MCH: 32.1 pg (ref 26.6–33.0)
MCHC: 32.5 g/dL (ref 31.5–35.7)
MCV: 99 fL — ABNORMAL HIGH (ref 79–97)
Platelets: 165 10*3/uL (ref 150–450)
RBC: 3.61 x10E6/uL — ABNORMAL LOW (ref 4.14–5.80)
RDW: 14.4 % (ref 11.6–15.4)
WBC: 10.4 10*3/uL (ref 3.4–10.8)

## 2021-01-13 LAB — COMPREHENSIVE METABOLIC PANEL
ALT: 16 IU/L (ref 0–44)
AST: 23 IU/L (ref 0–40)
Albumin/Globulin Ratio: 1.8 (ref 1.2–2.2)
Albumin: 4.3 g/dL (ref 3.5–4.6)
Alkaline Phosphatase: 69 IU/L (ref 44–121)
BUN/Creatinine Ratio: 28 — ABNORMAL HIGH (ref 10–24)
BUN: 37 mg/dL — ABNORMAL HIGH (ref 10–36)
Bilirubin Total: 0.3 mg/dL (ref 0.0–1.2)
CO2: 31 mmol/L — ABNORMAL HIGH (ref 20–29)
Calcium: 9.6 mg/dL (ref 8.6–10.2)
Chloride: 97 mmol/L (ref 96–106)
Creatinine, Ser: 1.33 mg/dL — ABNORMAL HIGH (ref 0.76–1.27)
Globulin, Total: 2.4 g/dL (ref 1.5–4.5)
Glucose: 80 mg/dL (ref 65–99)
Potassium: 4.7 mmol/L (ref 3.5–5.2)
Sodium: 143 mmol/L (ref 134–144)
Total Protein: 6.7 g/dL (ref 6.0–8.5)
eGFR: 48 mL/min/{1.73_m2} — ABNORMAL LOW (ref >59)

## 2021-01-13 LAB — URIC ACID: Uric Acid: 6.5 mg/dL (ref 3.8–8.4)

## 2021-01-13 LAB — MAGNESIUM: Magnesium: 2.5 mg/dL — ABNORMAL HIGH (ref 1.6–2.3)

## 2021-01-16 ENCOUNTER — Telehealth: Payer: Self-pay | Admitting: *Deleted

## 2021-01-16 ENCOUNTER — Telehealth: Payer: Self-pay | Admitting: Internal Medicine

## 2021-01-16 NOTE — Telephone Encounter (Signed)
He has been on the 80mg  lasix for some time---I think this spell might have been a combination of multiple factors. Since he was fine soon after the spell and doing okay now---I don't think any action is needed now

## 2021-01-16 NOTE — Telephone Encounter (Signed)
Remote transmission sent by wife who reported previous details of incident that occurred with patient 01/15/21 that was reported by his daughter-in-law. Asymptomatic today.Wife reports patient was confused when he fell to his knees and became oriented after drinking soda with sugar in it.   Remote transmission showed device function WNL, presenting EGM AF/VP , hx of AF, AF burden > 99% since 05/30/20 , NO OAC, no episodes or events . Advised wife that patient should be seen by PCP and reassured that Dr Anne Fu would be made aware of transmission results and episode. Verbalized understanding that PCP should be consulted about episode on 01/15/21.

## 2021-01-16 NOTE — Telephone Encounter (Signed)
Appreciate the update regarding pacemaker interrogation.  Thankfully device is functioning normally.  There is no cardiac explanation for his episode.  His anemia is very mild. Interestingly however he does demonstrate atrial fibrillation.   Given his high bleeding risk, age of 87 years old, I would try to avoid oral anticoagulation given that risks likely outweigh benefits.  [name], MD

## 2021-01-16 NOTE — Telephone Encounter (Signed)
Patient's daughter-in-law notified as instructed by telephone and verbalized understanding. Matthew Howard stated that his urologist had just stopped his tamsolosin about 4 days earlier and she wanted to let Dr. Alphonsus Sias know that.

## 2021-01-16 NOTE — Telephone Encounter (Signed)
Patient's daughter in law states yesterday around 1:00-1:30 PM the patient was walking into a restaurant and he has an episode in which his body became stiff and then he dropped to his knees. She states he did not faint because his eyes were open, but he looked scared. She states they helped to get him up and got him to a tablet and gave him soda. She states she assumes the episode wasn't caused by dehydration because he was eating fruit before he went to the restaurant. She also states he had only taken Lasix 80 MG around 8:00 AM yesterday, prior to the episode. She states he is asymptomatic and back to normal now, but she would like to know if his PPM can be checked to see if anything was picked up yesterday afternoon. She states she is also aware that the patient's hemoglobin and hematocrit have been low and she assumes this may also play a role in the recent episode.  Please advise.

## 2021-01-16 NOTE — Telephone Encounter (Signed)
Left detailed message on VM.

## 2021-01-16 NOTE — Telephone Encounter (Signed)
LMOM to call  Device clinic. Office hours and DC # provided. Need remote transmission.

## 2021-01-16 NOTE — Telephone Encounter (Signed)
Noted That can cause dizziness so I doubt stopping it would cause this spell

## 2021-01-16 NOTE — Telephone Encounter (Signed)
Patient's daughter-in-law Gavin Pound called stating that she needs to report to Dr, Alphonsus Sias an episode that he had yesterday. Gavin Pound stated that she has already called his cardiologist and she was told to call his PCP. Gavin Pound stated that her father-in-law got up yesterday, ate breakfast, took his medication and went to church. Gavin Pound stated that he took 80 mg of lasix before going to church. Gavin Pound stated that patient ate some fruit at church. Gavin Pound stated after church they decided to take her father-in-law out to eat lunch. Gavin Pound stated that when they were walking into the restaurant her father-in-law bent over and was going to walk into the glass but she was able to stop him. Gavin Pound stated that his body was real rigid at that time. Gavin Pound stated that she was afraid that he may pass out so they had him lay down on the floor and elevate his feet. Gavin Pound stated that they got him up to the table and gave him a soda and he ate. Gavin Pound stated that Mr. Rao has been just fine after that incident. Gavin Pound stated that she is wondering if he was a little dehydrated after taking the large dose of lasix.  Gavin Pound stated that she talked to cardiology and they told her that the report that they ran showed that he was in Afib but that is normal for him. Gavin Pound stated that patient just had lab work last week at the cardiologist office and she would like for Dr. Alphonsus Sias to review those labs and make any recommendations on the results. Gavin Pound stated that Mr. Raper seems to be doing fine and no other problems since the episode Sunday. ER precautions were given and she verbalized understanding.

## 2021-01-23 ENCOUNTER — Ambulatory Visit (INDEPENDENT_AMBULATORY_CARE_PROVIDER_SITE_OTHER): Payer: Medicare Other

## 2021-01-23 DIAGNOSIS — I441 Atrioventricular block, second degree: Secondary | ICD-10-CM

## 2021-01-25 ENCOUNTER — Ambulatory Visit: Payer: Medicare Other | Admitting: Internal Medicine

## 2021-01-25 LAB — CUP PACEART REMOTE DEVICE CHECK
Battery Remaining Longevity: 68 mo
Battery Remaining Percentage: 64 %
Battery Voltage: 2.9 V
Brady Statistic AP VP Percent: 0 %
Brady Statistic AP VS Percent: 0 %
Brady Statistic AS VP Percent: 0 %
Brady Statistic AS VS Percent: 0 %
Brady Statistic RA Percent Paced: 0 %
Brady Statistic RV Percent Paced: 98 %
Date Time Interrogation Session: 20220418135335
Implantable Lead Implant Date: 20150420
Implantable Lead Implant Date: 20150420
Implantable Lead Location: 753859
Implantable Lead Location: 753860
Implantable Pulse Generator Implant Date: 20150420
Lead Channel Impedance Value: 400 Ohm
Lead Channel Impedance Value: 450 Ohm
Lead Channel Pacing Threshold Amplitude: 0.5 V
Lead Channel Pacing Threshold Amplitude: 1 V
Lead Channel Pacing Threshold Pulse Width: 0.4 ms
Lead Channel Pacing Threshold Pulse Width: 0.4 ms
Lead Channel Sensing Intrinsic Amplitude: 2.5 mV
Lead Channel Sensing Intrinsic Amplitude: 9.7 mV
Lead Channel Setting Pacing Amplitude: 2 V
Lead Channel Setting Pacing Amplitude: 2.5 V
Lead Channel Setting Pacing Pulse Width: 0.4 ms
Lead Channel Setting Sensing Sensitivity: 2 mV
Pulse Gen Model: 2240
Pulse Gen Serial Number: 3013717

## 2021-02-08 NOTE — Progress Notes (Signed)
Remote pacemaker transmission.   

## 2021-03-10 ENCOUNTER — Other Ambulatory Visit: Payer: Self-pay | Admitting: Cardiology

## 2021-04-24 ENCOUNTER — Ambulatory Visit (INDEPENDENT_AMBULATORY_CARE_PROVIDER_SITE_OTHER): Payer: Medicare Other

## 2021-04-24 DIAGNOSIS — I441 Atrioventricular block, second degree: Secondary | ICD-10-CM

## 2021-04-25 LAB — CUP PACEART REMOTE DEVICE CHECK
Battery Remaining Longevity: 23 mo
Battery Remaining Percentage: 21 %
Battery Voltage: 2.9 V
Brady Statistic AP VP Percent: 53 %
Brady Statistic AP VS Percent: 1 %
Brady Statistic AS VP Percent: 46 %
Brady Statistic AS VS Percent: 1 %
Brady Statistic RA Percent Paced: 4.6 %
Brady Statistic RV Percent Paced: 98 %
Date Time Interrogation Session: 20220718063457
Implantable Lead Implant Date: 20150420
Implantable Lead Implant Date: 20150420
Implantable Lead Location: 753859
Implantable Lead Location: 753860
Implantable Pulse Generator Implant Date: 20150420
Lead Channel Impedance Value: 440 Ohm
Lead Channel Impedance Value: 480 Ohm
Lead Channel Pacing Threshold Amplitude: 0.5 V
Lead Channel Pacing Threshold Amplitude: 1 V
Lead Channel Pacing Threshold Pulse Width: 0.4 ms
Lead Channel Pacing Threshold Pulse Width: 0.4 ms
Lead Channel Sensing Intrinsic Amplitude: 12 mV
Lead Channel Sensing Intrinsic Amplitude: 2.9 mV
Lead Channel Setting Pacing Amplitude: 2 V
Lead Channel Setting Pacing Amplitude: 2.5 V
Lead Channel Setting Pacing Pulse Width: 0.4 ms
Lead Channel Setting Sensing Sensitivity: 2 mV
Pulse Gen Model: 2240
Pulse Gen Serial Number: 3013717

## 2021-05-01 ENCOUNTER — Telehealth: Payer: Self-pay | Admitting: Cardiology

## 2021-05-01 NOTE — Telephone Encounter (Signed)
Pt c/o Shortness Of Breath: STAT if SOB developed within the last 24 hours or pt is noticeably SOB on the phone  1. Are you currently SOB (can you hear that pt is SOB on the phone)? No pt is fine, he was sob on Saturday, he was outside for a while when he became sob  2. How long have you been experiencing SOB? Since Saturday 04/29/21   3. Are you SOB when sitting or when up moving around? both  4. Are you currently experiencing any other symptoms? Pt has gout left foot

## 2021-05-01 NOTE — Telephone Encounter (Signed)
Matthew Howard, [place] [phone]   Pts daughter in law on D[place] called to report that this past Saturday the pt was outdoors in the heat after he had his Lasix 80 mg and he felt like he was going to pass out so they sat him down and gave him water and he improved.  Sunday he had cantaloupe to eat and he developed diarrhea which made him feel weak and he fell but she thinks he fell due to the pain of gout flare up. He did not lose consciousness. However he was SOB so she gave him and extra 60 mg in the afternoon and he felt better but then he had more Sob at bedtime so she gave him another 20 mg and he went to sleep.   Monday/ today he felt much better. She has been giving him Lasix 80 mg in the morning and 60 mg in the evening and he has been doing well on those doses.   She will continue to monitor and be sure that he is hydrating well to compensate for the hot weather.   She will call his PCP for follow up of his gout.   I will forward to Dr. Anne Fu for review.   His next OV 07/2021.

## 2021-05-02 NOTE — Telephone Encounter (Signed)
LMOVM asking patient to send a manual transmission. I left device clinic direct office number in case they need help sending.

## 2021-05-02 NOTE — Telephone Encounter (Signed)
Sounds like he has stabilized.  If going out in the heat or if he has diarrhea, he should hold the lasix.    [name], MD   Spoke with daughter in law and made her aware of information.  She is also going to send a transmission because she is worried that pt maybe having arrhythmia issues.  Advised I will make device team aware and they will be in contact.  Gavin Pound appreciative for call.   Device- she is going to send over a transmission.  I told her if it didn't come through, someone would reach out to help her.  She is aware if no concerns on device, then they are to continue to monitor and let us know if no improvement.

## 2021-05-03 ENCOUNTER — Telehealth: Payer: Self-pay | Admitting: Cardiology

## 2021-05-03 ENCOUNTER — Telehealth: Payer: Self-pay

## 2021-05-03 NOTE — Telephone Encounter (Signed)
Follow up:    Patient daughter-in-law calling stating that the patient is having some wheezing and would like to check the status of her last call.

## 2021-05-03 NOTE — Telephone Encounter (Signed)
Called patient's Daughter-in-law, DPR back. She stated patient is having some wheezing now and congestion. She stated patient has taken a total of 3 mg in 3 days of Colchicine, and his gout is a little better. She stated patient takings Lasix 140 mg total daily. She was wanting to know if patient should take an increase dose of 160 mg of lasix today or see if she can get him in with his PCP. Will forward to Dr. Anne Fu for advisement.  Patient's daughter-in-law also wanted to know if device team received the transmission from yesterday and today. Will forward to Device team to follow up.

## 2021-05-03 NOTE — Telephone Encounter (Signed)
Merging open encounters:  Matthew Sicks, RN Yesterday (8:25 AM)     Sounds like he has stabilized.  If going out in the heat or if he has diarrhea, he should hold the lasix.     Matthew Schultz, MD    Spoke with daughter in law and made her aware of information.  She is also going to send a transmission because she is worried that pt maybe having arrhythmia issues.  Advised I will make device team aware and they will be in contact.  Matthew Howard appreciative for call.   Device- she is going to send over a transmission.  I told her if it didn't come through, someone would reach out to help her.  She is aware if no concerns on device, then they are to continue to monitor and let us know if no improvement.       Note    Matthew Bathe, MD  Matthew Millard, RN; Cv Div [facility] Triage Yesterday (7:04 AM)     Sounds like he has stabilized.  If going out in the heat or if he has diarrhea, he should hold the lasix.   Matthew Schultz, MD    Matthew Millard, RN routed conversation to Matthew Bathe, MD; Cv Div [facility] Triage 2 days ago   Matthew Millard, RN 2 days ago     [name], [name] [phone]    Pts daughter in law on [place] called to report that this past Saturday the pt was outdoors in the heat after he had his Lasix 80 mg and he felt like he was going to pass out so they sat him down and gave him water and he improved. Sunday he had cantaloupe to eat and he developed diarrhea which made him feel weak and he fell but she thinks he fell due to the pain of gout flare up. He did not lose consciousness. However he was SOB so she gave him and extra 60 mg in the afternoon and he felt better but then he had more Sob at bedtime so she gave him another 20 mg and he went to sleep.   Monday/ today he felt much better. She has been giving him Lasix 80 mg in the morning and 60 mg in the evening and he has been doing well on those doses.   She will continue to monitor and be sure that he is hydrating well to  compensate for the hot weather.   She will call his PCP for follow up of his gout.   I will forward to Dr. Anne Fu for review.   His next OV 07/2021.       Note

## 2021-05-03 NOTE — Telephone Encounter (Signed)
See 05/03/21 encounter

## 2021-05-03 NOTE — Telephone Encounter (Addendum)
We have not received any manual transmissions.  Spoke with pt daughter Stanton Kidney and provided instructions to send transmission.  She will try to send now as monitor requires landline for transmission.

## 2021-05-03 NOTE — Telephone Encounter (Signed)
Pt's DIL, Gavin Pound, called asking for an appt today as pt is having SOB. I advised her Dr Alphonsus Sias is out of the office today and we did not have any appts left in the office today. Also, we do not have xray right now. Gave her info on [facility] and [employer].

## 2021-05-03 NOTE — Telephone Encounter (Signed)
Manual transmission received.  Pt continues in persistent AF, this is not new.  Overall V rates controlled, with the exception of 1 Hvr episode this morning which looks like 8 seconds of RVR that spontaneously resolved.

## 2021-05-03 NOTE — Telephone Encounter (Signed)
Spoke with patient;'s daughter, Stanton Kidney again as transmission still has not come through.  She reports that when she pushes button to send a transmission all of the lights on the monitor flash.  Advised her to try unplugging the monitor wait a few minutes and replug in, if the same thing happens when she pushes button twice again, call [employer] tech support.  Phone number provided.     Attemtped to assess pt current condition and reasons why daughter is concerned.  She reports he has had chest congestion starting earlier this week, although today it is sounding worse.  Pt had a flare up of Gout for which she was treating with Colchicine as directed.  Pt does not exhibit any signs of lightheaded or dizziness and does not complain of palpitations.  Per her report the only issue is the chest congestion/ increased SOB on exertion.  She has contacted PCP office, his MD is out of town this week so they recommended pt be seen at [facility].  She indicated she is hesitant to go to UC because they don't know the patient.   She will continue to try to send manual transmission and contact tech support for assistance.

## 2021-05-04 ENCOUNTER — Ambulatory Visit: Payer: Medicare Other | Admitting: Interventional Cardiology

## 2021-05-04 ENCOUNTER — Other Ambulatory Visit: Payer: Self-pay

## 2021-05-04 ENCOUNTER — Ambulatory Visit
Admission: RE | Admit: 2021-05-04 | Discharge: 2021-05-04 | Disposition: A | Discharge status: Home / Self Care | Payer: Medicare Other | Source: Ambulatory Visit | Attending: Interventional Cardiology | Admitting: Interventional Cardiology

## 2021-05-04 ENCOUNTER — Encounter: Payer: Self-pay | Admitting: Interventional Cardiology

## 2021-05-04 VITALS — BP 140/62 | HR 70 | Ht 71.0 in | Wt 176.2 lb

## 2021-05-04 DIAGNOSIS — R0602 Shortness of breath: Secondary | ICD-10-CM

## 2021-05-04 DIAGNOSIS — I5032 Chronic diastolic (congestive) heart failure: Secondary | ICD-10-CM

## 2021-05-04 DIAGNOSIS — M1A0720 Idiopathic chronic gout, left ankle and foot, without tophus (tophi): Secondary | ICD-10-CM

## 2021-05-04 MED ORDER — FUROSEMIDE 80 MG PO TABS: 80.0000 mg | ORAL_TABLET | Freq: Two times a day (BID) | ORAL | 1 refills | Status: DC

## 2021-05-04 NOTE — Telephone Encounter (Signed)
Folow up:      Patient daughter-in-law calling wanting to see some one today and states she has been calling for about three days an states no one is helping her.

## 2021-05-04 NOTE — Patient Instructions (Signed)
Medication Instructions:  Your physician recommends that you continue on your current medications as directed. Please refer to the Current Medication list given to you today. OK to take over the counter allergy medication like Zyrtec *If you need a refill on your cardiac medications before your next appointment, please call your pharmacy*   Lab Work: Lab work to be done today--CBC, CMET and BNP If you have labs (blood work) drawn today and your tests are completely normal, you will receive your results only by: MyChart Message (if you have MyChart) OR A paper copy in the mail If you have any lab test that is abnormal or we need to change your treatment, we will call you to review the results.   Testing/Procedures: Have chest X ray done today at [facility] --[address]   Follow-Up: At [facility], you and your health needs are our priority.  As part of our continuing mission to provide you with exceptional heart care, we have created designated Provider Care Teams.  These Care Teams include your primary Cardiologist (physician) and Advanced Practice Providers (APPs -  Physician Assistants and Nurse Practitioners) who all work together to provide you with the care you need, when you need it.  We recommend signing up for the patient portal called "MyChart".  Sign up information is provided on this After Visit Summary.  MyChart is used to connect with patients for Virtual Visits (Telemedicine).  Patients are able to view lab/test results, encounter notes, upcoming appointments, etc.  Non-urgent messages can be sent to your provider as well.   To learn more about what you can do with MyChart, go to [email or web address].    Your next appointment:   As planned  The format for your next appointment:   In Person  Provider:   [name], MD and Dr [name]   Other Instructions

## 2021-05-04 NOTE — Progress Notes (Addendum)
Cardiology Office Note   Date:  05/04/2021   ID:  Matthew Howard, DOB May 21, 1919, MRN [id number]  PCP:  [name], MD    No chief complaint on file.  Shortness of breath  Wt Readings from Last 3 Encounters:  05/04/21 176 lb 3.2 oz (79.9 kg)  01/12/21 175 lb (79.4 kg)  10/13/20 171 lb 1.9 oz (77.6 kg)       History of Present Illness: Matthew Howard is a 87 y.o. male  patient of Dr. Anne Fu.  Prior records from 01/2021 show: "chronic diastolic heart failure follow-up, lower extremity edema, pacemaker.  I saw him back in December 2021 where we continued with Lasix therapy.  He ended up having a syncopal episode in January which could have been related to overdiuresis.  Lasix was recommended to be stopped.   He has had lower extremity edema in the past with 10 pound weight gain.  No evidence of DVT on Doppler.   Dr. [name] has been following his pacemaker, atrial fibrillation noted.  His pacemaker is not MRI compatible.  [employer] 2015.   His daughter is a Teacher, early years/pre.   Overall looks much better than previously.  He does have some inflammation on his thumb.  His knees and legs look much better.  He is back to taking Lasix 80 mg twice a day or a total of 160 daily.  No chest pain no shortness of breath.  Occasionally he will get wheezing and his daughter will give him an additional Lasix to help him with this."   He [place]  He felt better with drinking more water.  The following day, he had diarrhea and fell down on his bathroom.  He was trying to change himself due to incontinence.  He had gout pain a few days later.  Treated with colchicine.   He has rhinorrhea this time of the year.    Past Medical History:  Diagnosis Date   Advance directive discussed with patient 07/15/2015   Atrial fibrillation (HCC) 01/23/2011   Atrial flutter (HCC)    not well documented   Benign prostatic hyperplasia (BPH) with urinary urgency  09/11/2007   Qualifier: Diagnosis of  By: Alphonsus Sias MD, Ronnette Hila    Benign prostatic hypertrophy    Gout    Gout 09/11/2007   Qualifier: Diagnosis of  By: Alphonsus Sias MD, Ronnette Hila    Hyperlipidemia    Left inguinal hernia 03/29/2020   Mobitz type II atrioventricular block 01/25/2014   Pacemaker 04/27/2014   Pedal edema 09/10/2016   Routine general medical examination at a health care facility 07/01/2013   Scrotal right inuinal hernia 08/13/2019   Symptomatic bradycardia    s/p dual chamber PPM implantation April 2015 [facility])    Past Surgical History:  Procedure Laterality Date   CATARACT EXTRACTION     PACEMAKER INSERTION  01-25-2014   [employer]J Assurity dual chamber pacemaker implanted by Dr [name] for symptomatic bradycardia   PERMANENT PACEMAKER INSERTION N/A 01/25/2014   Procedure: PERMANENT PACEMAKER INSERTION;  Surgeon: Marinus Maw, MD;  Location: [facility] CATH LAB;  Service: Cardiovascular;  Laterality: N/A;   TONSILLECTOMY       Current Outpatient Medications  Medication Sig Dispense Refill   allopurinol (ZYLOPRIM) 100 MG tablet Take 2 tablets (200 mg total) by mouth daily. 30 tablet 11   aspirin 81 MG EC tablet Take 81 mg by mouth daily.  Cholecalciferol (VITAMIN D) 50 MCG (2000 UT) tablet Take 2,000 Units by mouth daily.      colchicine 0.6 MG tablet TAKE 1 TABLET (0.6 MG TOTAL) BY MOUTH 2 (TWO) TIMES DAILY AS NEEDED. 60 tablet 0   Cyanocobalamin 2500 MCG TABS Take 2,500 mcg by mouth daily.      donepezil (ARICEPT) 5 MG tablet Take 5 mg by mouth daily.      finasteride (PROSCAR) 5 MG tablet TAKE 1 TABLET BY MOUTH DAILY 90 tablet 3   furosemide (LASIX) 80 MG tablet TAKE 1 TABLET BY MOUTH 2 TIMES DAILY. 180 tablet 3   melatonin 5 MG TABS Take 5 mg by mouth at bedtime.     polyethylene glycol (MIRALAX / GLYCOLAX) 17 g packet Take 17 g by mouth daily.     predniSONE (DELTASONE) 20 MG tablet Take 2 tablets (40 mg total) by mouth daily. For 3 days, then 1 tab daily for 3 days  (Patient not taking: Reported on 05/04/2021) 9 tablet 0   No current facility-administered medications for this visit.    Allergies:   Patient has no known allergies.    Social History:  The patient  reports that he has never smoked. He has never used smokeless tobacco. He reports current alcohol use. He reports that he does not use drugs.   Family History:  The patient's family history includes Cancer in his mother; Throat cancer in his father.    ROS:  Please see the history of present illness.   Otherwise, review of systems are positive for wheezing.   All other systems are reviewed and negative.    PHYSICAL EXAM: VS:  BP 140/62   Pulse 70   Ht 5\' 11"  (1.803 m)   Wt 176 lb 3.2 oz (79.9 kg)   SpO2 97%   BMI 24.57 kg/m  , BMI Body mass index is 24.57 kg/m. GEN: Well nourished, well developed, in no acute distress HEENT: normal Neck: no JVD, carotid bruits, or masses Cardiac: RRR; no murmurs, rubs, or gallops,no edema  Respiratory:  wheezing to auscultation bilaterally, normal work of breathing; decreased breath sounds at the left base GI: soft, nontender, nondistended, + BS MS: no deformity or atrophy Skin: warm and dry, no rash Neuro:  Strength and sensation are intact Psych: euthymic mood, full affect   EKG:   The ekg ordered today demonstrates V paced rhythm   Recent Labs: 06/21/2020: Pro B Natriuretic peptide (BNP) 213.0 09/05/2020: B Natriuretic Peptide 296.3 01/12/2021: ALT 16; BUN 37; Creatinine, Ser 1.33; Hemoglobin 11.6; Magnesium 2.5; Platelets 165; Potassium 4.7; Sodium 143   Lipid Panel    Component Value Date/Time   CHOL 141 11/01/2008 1534   TRIG 104 11/01/2008 1534   HDL 43.4 11/01/2008 1534   CHOLHDL 3.2 CALC 11/01/2008 1534   VLDL 21 11/01/2008 1534   LDLCALC 77 11/01/2008 1534     Other studies Reviewed: Additional studies/ records that were reviewed today with results demonstrating: .   ASSESSMENT AND PLAN:  Chronic diastolic heart  failure: Increased SHOB.  Check CBC, BNP and CMet.  Check CXR.  May need to increase Lasix if renal function will allow. UNclear if [facility] may have a pulmonary component. CRI: Will adjust Lasix based on renal function.  Check labs today.  Gout: recent use of colchicine at home.  I encouraged a more plant based diet.     Current medicines are reviewed at length with the patient today.  The patient concerns regarding his medicines  were addressed.  The following changes have been made:  No change  Labs/ tests ordered today include:  No orders of the defined types were placed in this encounter.   Recommend 150 minutes/week of aerobic exercise Low fat, low carb, high fiber diet recommended  Disposition:   FU with Dr. Anne Fu   Signed, Lance Muss, MD  05/04/2021 11:49 AM    [facility] [address] [place], [place], [place]  [address] Phone: [phone]; Fax: [phone]

## 2021-05-04 NOTE — Telephone Encounter (Signed)
Spoke with pt's daughter-in-law, DPR who reports pt is complaining of increased SOB on exertion and with rest.  Pt has history of PPM, persistent AFIB and diastolic HF.  Weight is up 3 pounds.  Pt is currently taking Lasix 160mg  daily but daughter-in-law states she gave pt an extra 20mg  yesterday.  She has also attempted to have pt see his PCP this week but PCP is out of the office and they suggested pt go to UC.  Daughter-in-law is not agreeable with that plan and requests that pt see a provider here.  She is concerned because she can hear what she describes a "rattles and congestion" when the pt breathes.  Denies fever but states pt now has some productive cough of white sputum and is having to sleep on 3 pillows. She is afraid not to have pt evaluated. Pt's daughter-in-law advised Dr is DOD and has appointment available for 1130am today.  Daughter-in-law is agreeable with appointment and thanked .

## 2021-05-05 ENCOUNTER — Encounter (HOSPITAL_COMMUNITY): Payer: Self-pay | Admitting: Internal Medicine

## 2021-05-05 ENCOUNTER — Emergency Department (HOSPITAL_COMMUNITY): Payer: Medicare Other

## 2021-05-05 ENCOUNTER — Observation Stay (HOSPITAL_COMMUNITY): Payer: Medicare Other

## 2021-05-05 ENCOUNTER — Observation Stay (HOSPITAL_COMMUNITY)
Admission: EM | Admit: 2021-05-05 | Discharge: 2021-05-06 | Disposition: A | Discharge status: Home / Self Care | Payer: Medicare Other | Attending: Emergency Medicine | Admitting: Emergency Medicine

## 2021-05-05 DIAGNOSIS — J939 Pneumothorax, unspecified: Secondary | ICD-10-CM | POA: Diagnosis present

## 2021-05-05 DIAGNOSIS — R0602 Shortness of breath: Secondary | ICD-10-CM | POA: Diagnosis present

## 2021-05-05 DIAGNOSIS — M109 Gout, unspecified: Secondary | ICD-10-CM | POA: Diagnosis present

## 2021-05-05 DIAGNOSIS — I5032 Chronic diastolic (congestive) heart failure: Secondary | ICD-10-CM | POA: Insufficient documentation

## 2021-05-05 DIAGNOSIS — Z79899 Other long term (current) drug therapy: Secondary | ICD-10-CM | POA: Insufficient documentation

## 2021-05-05 DIAGNOSIS — S299XXA Unspecified injury of thorax, initial encounter: Secondary | ICD-10-CM | POA: Insufficient documentation

## 2021-05-05 DIAGNOSIS — W19XXXA Unspecified fall, initial encounter: Secondary | ICD-10-CM | POA: Diagnosis not present

## 2021-05-05 DIAGNOSIS — S270XXA Traumatic pneumothorax, initial encounter: Secondary | ICD-10-CM | POA: Diagnosis not present

## 2021-05-05 DIAGNOSIS — Z950 Presence of cardiac pacemaker: Secondary | ICD-10-CM | POA: Insufficient documentation

## 2021-05-05 DIAGNOSIS — Y92009 Unspecified place in unspecified non-institutional (private) residence as the place of occurrence of the external cause: Secondary | ICD-10-CM | POA: Diagnosis not present

## 2021-05-05 DIAGNOSIS — N401 Enlarged prostate with lower urinary tract symptoms: Secondary | ICD-10-CM | POA: Diagnosis not present

## 2021-05-05 DIAGNOSIS — I4891 Unspecified atrial fibrillation: Secondary | ICD-10-CM

## 2021-05-05 DIAGNOSIS — F0390 Unspecified dementia without behavioral disturbance: Secondary | ICD-10-CM

## 2021-05-05 DIAGNOSIS — R3915 Urgency of urination: Secondary | ICD-10-CM

## 2021-05-05 DIAGNOSIS — N1832 Chronic kidney disease, stage 3b: Secondary | ICD-10-CM | POA: Diagnosis not present

## 2021-05-05 DIAGNOSIS — F0150 Vascular dementia without behavioral disturbance: Secondary | ICD-10-CM | POA: Diagnosis not present

## 2021-05-05 DIAGNOSIS — Z20822 Contact with and (suspected) exposure to covid-19: Secondary | ICD-10-CM | POA: Diagnosis not present

## 2021-05-05 DIAGNOSIS — Z7982 Long term (current) use of aspirin: Secondary | ICD-10-CM | POA: Insufficient documentation

## 2021-05-05 HISTORY — DX: Unspecified dementia, unspecified severity, without behavioral disturbance, psychotic disturbance, mood disturbance, and anxiety: F03.90

## 2021-05-05 LAB — RESP PANEL BY RT-PCR (FLU A&B, COVID) ARPGX2
Influenza A by PCR: NEGATIVE
Influenza B by PCR: NEGATIVE
SARS Coronavirus 2 by RT PCR: NEGATIVE

## 2021-05-05 LAB — COMPREHENSIVE METABOLIC PANEL
ALT: 11 IU/L (ref 0–44)
ALT: 13 U/L (ref 0–44)
AST: 20 IU/L (ref 0–40)
AST: 23 U/L (ref 15–41)
Albumin/Globulin Ratio: 1.4 (ref 1.2–2.2)
Albumin: 4.1 g/dL (ref 3.5–4.6)
Albumin: 3.3 g/dL — ABNORMAL LOW (ref 3.5–5.0)
Alkaline Phosphatase: 71 IU/L (ref 44–121)
Alkaline Phosphatase: 56 U/L (ref 38–126)
Anion gap: 8 (ref 5–15)
BUN/Creatinine Ratio: 20 (ref 10–24)
BUN: 27 mg/dL (ref 10–36)
BUN: 28 mg/dL — ABNORMAL HIGH (ref 8–23)
Bilirubin Total: 0.7 mg/dL (ref 0.0–1.2)
CO2: 29 mmol/L (ref 20–29)
CO2: 35 mmol/L — ABNORMAL HIGH (ref 22–32)
Calcium: 9.2 mg/dL (ref 8.6–10.2)
Calcium: 8.8 mg/dL — ABNORMAL LOW (ref 8.9–10.3)
Chloride: 98 mmol/L (ref 96–106)
Chloride: 96 mmol/L — ABNORMAL LOW (ref 98–111)
Creatinine, Ser: 1.32 mg/dL — ABNORMAL HIGH (ref 0.76–1.27)
Creatinine, Ser: 1.44 mg/dL — ABNORMAL HIGH (ref 0.61–1.24)
GFR, Estimated: 44 mL/min — ABNORMAL LOW (ref >60)
Globulin, Total: 2.9 g/dL (ref 1.5–4.5)
Glucose, Bld: 131 mg/dL — ABNORMAL HIGH (ref 70–99)
Glucose: 78 mg/dL (ref 65–99)
Potassium: 4.2 mmol/L (ref 3.5–5.2)
Potassium: 3.6 mmol/L (ref 3.5–5.1)
Sodium: 145 mmol/L — ABNORMAL HIGH (ref 134–144)
Sodium: 139 mmol/L (ref 135–145)
Total Bilirubin: 0.6 mg/dL (ref 0.3–1.2)
Total Protein: 7 g/dL (ref 6.0–8.5)
Total Protein: 6.6 g/dL (ref 6.5–8.1)
eGFR: 48 mL/min/{1.73_m2} — ABNORMAL LOW (ref >59)

## 2021-05-05 LAB — CBC WITH DIFFERENTIAL/PLATELET
Abs Immature Granulocytes: 0.02 10*3/uL (ref 0.00–0.07)
Basophils Absolute: 0 10*3/uL (ref 0.0–0.1)
Basophils Relative: 0 %
Eosinophils Absolute: 0.4 10*3/uL (ref 0.0–0.5)
Eosinophils Relative: 5 %
HCT: 35.3 % — ABNORMAL LOW (ref 39.0–52.0)
Hemoglobin: 10.8 g/dL — ABNORMAL LOW (ref 13.0–17.0)
Immature Granulocytes: 0 %
Lymphocytes Relative: 20 %
Lymphs Abs: 1.5 10*3/uL (ref 0.7–4.0)
MCH: 33 pg (ref 26.0–34.0)
MCHC: 30.6 g/dL (ref 30.0–36.0)
MCV: 108 fL — ABNORMAL HIGH (ref 80.0–100.0)
Monocytes Absolute: 0.9 10*3/uL (ref 0.1–1.0)
Monocytes Relative: 12 %
Neutro Abs: 4.7 10*3/uL (ref 1.7–7.7)
Neutrophils Relative %: 63 %
Platelets: 174 10*3/uL (ref 150–400)
RBC: 3.27 MIL/uL — ABNORMAL LOW (ref 4.22–5.81)
RDW: 16.4 % — ABNORMAL HIGH (ref 11.5–15.5)
WBC: 7.6 10*3/uL (ref 4.0–10.5)
nRBC: 0 % (ref 0.0–0.2)

## 2021-05-05 LAB — CBC
Hematocrit: 35.5 % — ABNORMAL LOW (ref 37.5–51.0)
Hemoglobin: 11.6 g/dL — ABNORMAL LOW (ref 13.0–17.7)
MCH: 32.5 pg (ref 26.6–33.0)
MCHC: 32.7 g/dL (ref 31.5–35.7)
MCV: 99 fL — ABNORMAL HIGH (ref 79–97)
Platelets: 186 10*3/uL (ref 150–450)
RBC: 3.57 x10E6/uL — ABNORMAL LOW (ref 4.14–5.80)
RDW: 14 % (ref 11.6–15.4)
WBC: 10.6 10*3/uL (ref 3.4–10.8)

## 2021-05-05 LAB — PROTIME-INR
INR: 1.1 (ref 0.8–1.2)
Prothrombin Time: 13.9 seconds (ref 11.4–15.2)

## 2021-05-05 LAB — PRO B NATRIURETIC PEPTIDE: NT-Pro BNP: 1477 pg/mL — ABNORMAL HIGH (ref 0–486)

## 2021-05-05 LAB — BRAIN NATRIURETIC PEPTIDE: B Natriuretic Peptide: 175.5 pg/mL — ABNORMAL HIGH (ref 0.0–100.0)

## 2021-05-05 MED ORDER — COLCHICINE 0.6 MG PO TABS
0.6000 mg | ORAL_TABLET | Freq: Two times a day (BID) | ORAL | Status: DC | PRN
  Filled 2021-05-05: qty 1

## 2021-05-05 MED ORDER — MORPHINE SULFATE (PF) 2 MG/ML IV SOLN: 2.0000 mg | INTRAVENOUS | Status: DC | PRN

## 2021-05-05 MED ORDER — ACETAMINOPHEN 325 MG PO TABS: 650.0000 mg | ORAL_TABLET | Freq: Four times a day (QID) | ORAL | Status: DC | PRN

## 2021-05-05 MED ORDER — DOCUSATE SODIUM 100 MG PO CAPS
100.0000 mg | ORAL_CAPSULE | Freq: Two times a day (BID) | ORAL | Status: DC
  Filled 2021-05-05 (×2): qty 1

## 2021-05-05 MED ORDER — DONEPEZIL HCL 5 MG PO TABS
5.0000 mg | ORAL_TABLET | Freq: Every day | ORAL | Status: DC
  Administered 2021-05-06: 5 mg via ORAL
  Filled 2021-05-05: qty 1

## 2021-05-05 MED ORDER — ACETAMINOPHEN 650 MG RE SUPP: 650.0000 mg | Freq: Four times a day (QID) | RECTAL | Status: DC | PRN

## 2021-05-05 MED ORDER — HYDRALAZINE HCL 20 MG/ML IJ SOLN: 5.0000 mg | INTRAMUSCULAR | Status: DC | PRN

## 2021-05-05 MED ORDER — ONDANSETRON HCL 4 MG/2ML IJ SOLN: 4.0000 mg | Freq: Four times a day (QID) | INTRAMUSCULAR | Status: DC | PRN

## 2021-05-05 MED ORDER — SODIUM CHLORIDE 0.9% FLUSH
3.0000 mL | Freq: Two times a day (BID) | INTRAVENOUS | Status: DC
  Administered 2021-05-05 – 2021-05-06 (×2): 3 mL via INTRAVENOUS

## 2021-05-05 MED ORDER — HALOPERIDOL LACTATE 5 MG/ML IJ SOLN: 5.0000 mg | Freq: Four times a day (QID) | INTRAMUSCULAR | Status: DC | PRN

## 2021-05-05 MED ORDER — ALBUTEROL SULFATE (2.5 MG/3ML) 0.083% IN NEBU: 2.5000 mg | INHALATION_SOLUTION | RESPIRATORY_TRACT | Status: DC | PRN

## 2021-05-05 MED ORDER — BISACODYL 5 MG PO TBEC: 5.0000 mg | DELAYED_RELEASE_TABLET | Freq: Every day | ORAL | Status: DC | PRN

## 2021-05-05 MED ORDER — POLYETHYLENE GLYCOL 3350 17 G PO PACK
17.0000 g | PACK | Freq: Every day | ORAL | Status: DC
  Administered 2021-05-06: 17 g via ORAL
  Filled 2021-05-05: qty 1

## 2021-05-05 MED ORDER — HYDROCODONE-ACETAMINOPHEN 5-325 MG PO TABS: 1.0000 | ORAL_TABLET | ORAL | Status: DC | PRN

## 2021-05-05 MED ORDER — FINASTERIDE 5 MG PO TABS
5.0000 mg | ORAL_TABLET | Freq: Every day | ORAL | Status: DC
  Administered 2021-05-06: 5 mg via ORAL
  Filled 2021-05-05: qty 1

## 2021-05-05 MED ORDER — MELATONIN 5 MG PO TABS
5.0000 mg | ORAL_TABLET | Freq: Every day | ORAL | Status: DC
  Administered 2021-05-05: 5 mg via ORAL
  Filled 2021-05-05: qty 1

## 2021-05-05 MED ORDER — ALLOPURINOL 100 MG PO TABS
200.0000 mg | ORAL_TABLET | Freq: Every day | ORAL | Status: DC
  Administered 2021-05-06: 200 mg via ORAL
  Filled 2021-05-05: qty 2

## 2021-05-05 MED ORDER — FUROSEMIDE 80 MG PO TABS
80.0000 mg | ORAL_TABLET | Freq: Two times a day (BID) | ORAL | Status: DC
  Administered 2021-05-05 – 2021-05-06 (×2): 80 mg via ORAL
  Filled 2021-05-05: qty 1
  Filled 2021-05-05: qty 4

## 2021-05-05 MED ORDER — ONDANSETRON HCL 4 MG PO TABS: 4.0000 mg | ORAL_TABLET | Freq: Four times a day (QID) | ORAL | Status: DC | PRN

## 2021-05-05 NOTE — Plan of Care (Signed)
  Problem: Activity: Goal: Risk for activity intolerance will decrease Outcome: Progressing   Problem: Nutrition: Goal: Adequate nutrition will be maintained Outcome: Progressing   Problem: Coping: Goal: Level of anxiety will decrease Outcome: Progressing   Problem: Pain Managment: Goal: General experience of comfort will improve Outcome: Progressing   

## 2021-05-05 NOTE — ED Provider Notes (Signed)
Emergency Medicine Provider Triage Evaluation Note  Matthew Howard , a 87 y.o. male  was evaluated in triage.  Pt complains of who presents at prompting of PCP for pneumothorax identified on chest x-ray obtained yesterday.  Per chest x-ray read has a 20 to 25% left-sided pneumothorax.  Vital signs are normal on this time.    Review of Systems  Positive: None Negative: Chest pain, shortness of breath, palpitations  Physical Exam  BP (!) 151/54 (BP Location: Left Arm)   Pulse 63   Temp 98.2 F (36.8 C) (Oral)   Resp 18   SpO2 95%  Gen:   Awake, no distress   Resp:  Normal effort  MSK:   Moves extremities without difficulty  Other:  Absent breath sounds in the left upper lobe, right lung is clear to auscultation.  RRR no M/R/G.  Patient does have paced rhythm on EKG.  Medical Decision Making  Medically screening exam initiated at 10:37 AM.  Appropriate orders placed.  Matthew Howard was informed that the remainder of the evaluation will be completed by another provider, this initial triage assessment does not replace that evaluation, and the importance of remaining in the ED until their evaluation is complete.  This chart was dictated using voice recognition software, Dragon. Despite the best efforts of this provider to proofread and correct errors, errors may still occur which can change documentation meaning.    Sherrilee Gilles 05/05/21 1038    Cathren Laine, MD 05/06/21 1247

## 2021-05-05 NOTE — ED Notes (Signed)
Report attempted 

## 2021-05-05 NOTE — Progress Notes (Signed)
IR consulted for placement of left chest tube for left pneumothorax. Procedure approved by Dr. Bryn Gulling but IR was notified by Dr. Ophelia Charter that this procedure is now on hold.   Dr. Ophelia Charter will contact IR if plans change.  Alwyn Ren, [place] [phone] 05/05/2021, 2:32 PM

## 2021-05-05 NOTE — ED Notes (Signed)
Pt ambulated to Bathroom

## 2021-05-05 NOTE — H&P (Signed)
History and Physical    Matthew Howard OZH:[id number] DOB: Oct 28, 1918 DOA: 05/05/2021  PCP: [name], MD Consultants:  [facility] - cardiology; Sherryll Burger - neurology Patient coming from:  Home - lives with son and wife; NOK: [place], [place] [phone]; [phone]  Chief Complaint: Pneumothorax  HPI: Matthew Howard is a 87 y.o. male with medical history significant of afib; BPH; HLD; dementia; and pacemaker placement presenting with SOB, CXR with confirmed pneumothorax. He started on Saturday - went to the [employer] and it was a little hot, he almost collapsed.  They gave him water, sat him down, and he got better and they took him home.  Sunday, he developed diarrhea with incontinence and he was found on the floor in his room.  He was conscious, unable to get up.  No LOC.  He seemed fine after.  The next day, he had an episode of gout - Colchicine 1.2 mg and then 1.2 the next day (divided x 2 doses per day), 0.6 the following day.  He developed SOB on Monday or Tuesday - he gets this periodically with gout.  Tuesday or Wednesday, he appeared to have some congestion.  They called the cardiology office and he received a dose of Lasix 140-160 daily since Monday.  They called the PCP and he was unavailable.  He was seen by cards yesterday with Dr. Eldridge Dace, who ordered a CXR.  Based on the CXR, Dr. Anne Fu called today and recommended ER evaluation.  They repeated it this AM to check for stability.  He does have a bruise on  his side that his DIL noted this AM.    ED Course: Larey Seat on Sunday, unwitnessed.  SOB since.  CXR by cards, stable left-sided PTX.  CT surgery recommends IR-placed chest tube placement and will follow.   Review of Systems: As per HPI; otherwise review of systems reviewed and negative.   Ambulatory Status:  Ambulates with a cane  COVID Vaccine Status:  Complete plus 2 boosters  Past Medical History:  Diagnosis Date   Advance directive discussed with patient  07/15/2015   Atrial fibrillation (HCC) 01/23/2011   Benign prostatic hyperplasia (BPH) with urinary urgency 09/11/2007   Qualifier: Diagnosis of  By: Alphonsus Sias MD, Ronnette Hila    Dementia [facility])    Gout    Hyperlipidemia    Left inguinal hernia 03/29/2020   Mobitz type II atrioventricular block 01/25/2014   Pacemaker 04/27/2014   Pedal edema 09/10/2016   Routine general medical examination at a health care facility 07/01/2013   Scrotal right inuinal hernia 08/13/2019   Symptomatic bradycardia    s/p dual chamber PPM implantation April 2015 [facility])    Past Surgical History:  Procedure Laterality Date   CATARACT EXTRACTION     PACEMAKER INSERTION  01-25-2014   STJ Assurity dual chamber pacemaker implanted by Dr [name] for symptomatic bradycardia   PERMANENT PACEMAKER INSERTION N/A 01/25/2014   Procedure: PERMANENT PACEMAKER INSERTION;  Surgeon: Marinus Maw, MD;  Location: [facility] C[facility] LAB;  Service: Cardiovascular;  Laterality: N/A;   TONSILLECTOMY      Social History   Socioeconomic History   Marital status: Widowed    Spouse name: Kalik Hoare   Number of children: 4   Years of education: Not on file   Highest education level: Not on file  Occupational History   Occupation: retired    Comment: [employer]  Tobacco Use   Smoking status: Never   Smokeless tobacco: Never  Vaping Use   Vaping Use: Never used  Substance and Sexual Activity   Alcohol use: Yes    Comment: rare etoh use   Drug use: No   Sexual activity: Not on file  Other Topics Concern   Not on file  Social History Narrative   Widowed 4/15   No living will   Health care power of attorney is son Annette StableBill. Daughter [name] is financial POA    He feels he is ready for DNR---form done 08/13/19 (but he isn't sure he will put it up)   No tube feeds if cognitively unaware   Social Determinants of Health   Financial Resource Strain: Not on file  Food Insecurity: Not on file  Transportation Needs: Not on file   Physical Activity: Not on file  Stress: Not on file  Social Connections: Not on file  Intimate Partner Violence: Not on file    No Known Allergies  Family History  Problem Relation Age of Onset   Cancer Mother    Throat cancer Father     Prior to Admission medications   Medication Sig Start Date End Date Taking? Authorizing Provider  allopurinol (ZYLOPRIM) 100 MG tablet Take 2 tablets (200 mg total) by mouth daily. 12/22/20  Yes Karie SchwalbeLetvak, Richard I, MD  aspirin 81 MG EC tablet Take 81 mg by mouth daily.   Yes [provider]  Cholecalciferol (VITAMIN D) 50 MCG (2000 UT) tablet Take 2,000 Units by mouth daily.    Yes [provider]  colchicine 0.6 MG tablet TAKE 1 TABLET (0.6 MG TOTAL) BY MOUTH 2 (TWO) TIMES DAILY AS NEEDED. Patient taking differently: Take 0.6 mg by mouth 2 (two) times daily as needed (gout pain). 10/29/20  Yes Karie SchwalbeLetvak, Richard I, MD  Cyanocobalamin 2500 MCG TABS Take 2,500 mcg by mouth daily.    Yes [provider]  donepezil (ARICEPT) 5 MG tablet Take 5 mg by mouth daily.    Yes [provider]  finasteride (PROSCAR) 5 MG tablet TAKE 1 TABLET BY MOUTH DAILY Patient taking differently: Take 5 mg by mouth daily. 09/12/16  Yes Karie SchwalbeLetvak, Richard I, MD  furosemide (LASIX) 80 MG tablet Take 1 tablet (80 mg total) by mouth 2 (two) times daily. 05/04/21  Yes Corky CraftsVaranasi, Jayadeep S, MD  melatonin 5 MG TABS Take 5 mg by mouth at bedtime.   Yes [provider]  polyethylene glycol (MIRALAX / GLYCOLAX) 17 g packet Take 17 g by mouth daily.   Yes [provider]  predniSONE (DELTASONE) 20 MG tablet Take 2 tablets (40 mg total) by mouth daily. For 3 days, then 1 tab daily for 3 days Patient not taking: No sig reported 01/10/21   Karie SchwalbeLetvak, Richard I, MD    Physical Exam: Vitals:   05/05/21 1230 05/05/21 1300 05/05/21 1417 05/05/21 1500  BP: (!) 125/59 (!) 134/55  (!) 144/57  Pulse: 62 77  78  Resp: (!) 21 15  20   Temp:   97.9 F  (36.6 C)   TempSrc:   Oral   SpO2: 96% 97%  92%     General:  Appears calm and comfortable and is in NAD, on RA Eyes:  PERRL, EOMI, normal lids, iris ENT:   hard of hearing, grossly normal lips & tongue, mmm; appropriate dentition Neck:  no LAD, masses or thyromegaly Cardiovascular:  RRR, no m/r/g. No LE edema.  Respiratory:   Scattered B expiratory wheezes, decreased air movement in LLL.  Normal to mildly increased respiratory  effort. Abdomen:  soft, NT, ND Skin:  no rash or induration seen on limited exam; large healing L flank bruise Musculoskeletal:  grossly normal tone BUE/BLE, good ROM, no bony abnormality Psychiatric:  cantankerous mood and affect, speech fluent and appropriate, AOx1-2 Neurologic:  CN 2-12 grossly intact, moves all extremities in coordinated fashion    Radiological Exams on Admission: Independently reviewed - see discussion in A/P where applicable  DG Chest 2 View  Result Date: 05/05/2021 CLINICAL DATA:  SOB; left sided pneumo that was dx yesterday; repeat cxr EXAM: CHEST - 2 VIEW COMPARISON:  05/04/2021. FINDINGS: Similar size of a moderate left pneumothorax. Similar versus slightly increased small left pleural effusion with overlying streaky left basilar opacities. Unchanged cardiomediastinal silhouette. Atherosclerosis of the aorta. Similar position of a dual lead left subclavian approach cardiac rhythm maintenance device. Osteopenia. Polyarticular degenerative change. IMPRESSION: 1. Similar moderate left pneumothorax. 2. Similar versus slightly increased small left pleural effusion and streaky overlying left basilar opacities, which could represent atelectasis, aspiration, and/or pneumonia. Electronically Signed   By: Feliberto Harts MD   On: 05/05/2021 11:57   DG Chest 2 View  Result Date: 05/05/2021 CLINICAL DATA:  Short of breath EXAM: CHEST - 2 VIEW COMPARISON:  Prior chest x-ray 06/15/2020 FINDINGS: Left subclavian approach cardiac rhythm maintenance  device with leads projecting over the right atrium and right ventricle. Cardiac and mediastinal contours remain unchanged. Atherosclerotic calcifications present in the transverse aorta. There is a left-sided pneumothorax with approximately 20-25% loss of lung volume. The pleural line is visible from the apex to the lateral base. No evidence of pneumothorax on the right. Chronic bronchitic changes are present bilaterally. No acute osseous abnormality. IMPRESSION: 1. Approximately 20-25% left-sided pneumothorax. 2. Otherwise, stable chronic bronchitic changes and left subclavian approach cardiac rhythm maintenance device. These results will be called to the ordering clinician or representative by the Radiologist Assistant, and communication documented in the PACS or [employer]. Electronically Signed   By: Malachy Moan M.D.   On: 05/05/2021 08:37    EKG: Independently reviewed.  Ventricularly-paced rhythm with NSCSLT   Labs on Admission: I have personally reviewed the available labs and imaging studies at the time of the admission.  Pertinent labs:   From 7/28: Na++ 145 - stable BUN 27/Creatinine 1.32/GFR 48 - stable Pro-BNP 1477 WBC 10.6 - stable Hgb 11.6 - stable   Assessment/Plan Principal Problem:   Pneumothorax Active Problems:   Gout   Benign prostatic hyperplasia (BPH) with urinary urgency   Pacemaker   Vascular dementia, uncomplicated (HCC)   Chronic diastolic heart failure (HCC)   Pneumothorax -Patient had a fall Sunday that likely caused a traumatic PTX -He was seen by cardiology yesterday for SOB and periodic wheezing and CXR was read this AM -Repeat CXR shows stability -Chest tube was recommended by CT surgery -IR was consulted for placement -However, after additional discussion with family, the PTX appears to be stable and risks of chest tube insertion appear to be greater than ongoing monitoring at this time -We also discussed the idea of dc to home with  outpatient management if a comfort approach was desired - but it is not at this time -Will observe the patient overnight on telemetry with continuous pulse ox -Repeat chest tube after 6 hours and again in AM; if ongoing stability he is likely able to dc to home tomorrow -Will place on high-flow O2 for now with NRB -Wheezing is B in nature and may be associated with atelectasis (less  likely infection, pulmonary edema); will give nebs prn -Dr. Dorris Fetch is in agreement with this plan  Chronic diastolic CHF -Appears to be compensated clinically -He had an elevated pro-BNP yesterday; repeat BNP is pending today -For now, will continue home Lasix dosing -Has pacemaker  Dementia -He is already agitated about being here and appears likely to struggle from delirium -Continue Aricept, melatonin -Family counseled about how patients with dementia often do better behaviorally while hospitalized if family members are present overnight  -Delirium precautions ordered -Will add haldol for prn use  BPH -Continue Proscar  Gout -Continue Alloprinol, prn Colchicine -Lasix is likely exacerbating this issue  Goals of care -Patient had an out of facility DNR in his chart from 2020, signed by his PCP -After discussion, his son requested to rescind this DNR and make the patient full code -This was discussed at length and ongoing discussion is encouraged, since resuscitation in this demographic is rarely successful and often leads to poor outcomes     Note: This patient has been tested and is negative for the novel coronavirus COVID-19. The patient has been fully vaccinated against COVID-19.   Level of care: Telemetry Medical DVT prophylaxis: SCDs Code Status:  Full - confirmed with patient/family Family Communication: Son and daughter-in-law were present throughout evaluation Disposition Plan:  The patient is from: home  Anticipated d/c is to: home without [facility] services   Anticipated d/c date  will depend on clinical response to treatment, but possibly as early as tomorrow if he has excellent response to treatment  Patient is currently: acutely ill Consults called: CT surgery; IR  Admission status:  It is my clinical opinion that referral for OBSERVATION is reasonable and necessary in this patient based on the above information provided. The aforementioned taken together are felt to place the patient at high risk for further clinical deterioration. However it is anticipated that the patient may be medically stable for discharge from the hospital within 24 to 48 hours.    Jonah Blue MD [facility]   How to contact the [facility] Attending or Consulting provider 7A - 7P or covering provider during after hours 7P -7A, for this patient?  Check the care team in [facility] and look for a) attending/consulting [facility] provider listed and b) the [facility] team listed Log into [email or web address] and use [place]'s universal password to access. If you do not have the password, please contact the hospital operator. Locate the [facility] provider you are looking for under [facility] and page to a number that you can be directly reached. If you still have difficulty reaching the provider, please page the [facility] (Director on Call) for the Hospitalists listed on amion for assistance.   05/05/2021, 3:28 PM

## 2021-05-05 NOTE — ED Triage Notes (Signed)
Patient sent by POV with confirmed pneumo confirmed by xray yesterday. Patient doesn't know why he is here. Appears in no distress

## 2021-05-05 NOTE — ED Notes (Signed)
Patient transported to X-ray 

## 2021-05-05 NOTE — ED Provider Notes (Signed)
[facility] EMERGENCY DEPARTMENT Provider Note   CSN: [id number] Arrival date & time: 05/05/21  1024     History Chief Complaint  Patient presents with   pneumo 25%    Matthew Howard is a 87 y.o. male.  HPI Patient is a 87 year old male with a complex medical history noted below.  He presents to the emergency department at the request of his cardiologist due to a possible left-sided pneumothorax.  5 days ago patient had a fall onto his left side.  This was not witnessed and his daughter-in-law at bedside cannot provide details regarding the fall.  She states that over the past few days he had progressive shortness of breath.  He has a history of atrial fibrillation, Mobitz type II atrioventricular block, chronic diastolic heart failure, and states that over the past year he has had regular shortness of breath.  Since his symptoms persisted she followed up with his PCP as well as his cardiologist and he was ultimately evaluated by his cardiologist yesterday.  Basic labs were obtained as well as a chest x-ray.  They notified the patient's daughter-in-law today that he had a likely left-sided pneumothorax and he was told to come to the emergency department for further evaluation.  Patient has no physical complaints at this time when asked.    Past Medical History:  Diagnosis Date   Advance directive discussed with patient 07/15/2015   Atrial fibrillation (HCC) 01/23/2011   Atrial flutter (HCC)    not well documented   Benign prostatic hyperplasia (BPH) with urinary urgency 09/11/2007   Qualifier: Diagnosis of  By: Alphonsus Sias MD, Ronnette Hila    Benign prostatic hypertrophy    Gout    Gout 09/11/2007   Qualifier: Diagnosis of  By: Alphonsus Sias MD, Ronnette Hila    Hyperlipidemia    Left inguinal hernia 03/29/2020   Mobitz type II atrioventricular block 01/25/2014   Pacemaker 04/27/2014   Pedal edema 09/10/2016   Routine general medical examination at a health care facility 07/01/2013    Scrotal right inuinal hernia 08/13/2019   Symptomatic bradycardia    s/p dual chamber PPM implantation April 2015 [facility])    Patient Active Problem List   Diagnosis Date Noted   Gout of left knee 10/13/2020   Left leg pain 09/12/2020   Vascular dementia, uncomplicated (HCC) 06/21/2020   Chronic diastolic heart failure (HCC) 06/21/2020   Acute delirium 06/15/2020   Urinary frequency 06/15/2020   Fatigue 06/07/2020   Superficial injury of skin 06/07/2020   Symptomatic bradycardia 05/30/2020   Left inguinal hernia 03/29/2020   Scrotal right inuinal hernia 08/13/2019   Pedal edema 09/10/2016   Advance directive discussed with patient 07/15/2015   Pacemaker 04/27/2014   Mobitz type II atrioventricular block 01/25/2014   Routine general medical examination at a health care facility 07/01/2013   Atrial fibrillation (HCC) 01/23/2011   Gout 09/11/2007   Benign prostatic hyperplasia (BPH) with urinary urgency 09/11/2007    Past Surgical History:  Procedure Laterality Date   CATARACT EXTRACTION     PACEMAKER INSERTION  01-25-2014   STJ Assurity dual chamber pacemaker implanted by Dr [name] for symptomatic bradycardia   PERMANENT PACEMAKER INSERTION N/A 01/25/2014   Procedure: PERMANENT PACEMAKER INSERTION;  Surgeon: Marinus Maw, MD;  Location: [facility] CATH LAB;  Service: Cardiovascular;  Laterality: N/A;   TONSILLECTOMY         Family History  Problem Relation Age of Onset   Cancer Mother  Throat cancer Father     Social History   Tobacco Use   Smoking status: Never   Smokeless tobacco: Never  Vaping Use   Vaping Use: Never used  Substance Use Topics   Alcohol use: Yes    Comment: rare etoh use   Drug use: No    Home Medications Prior to Admission medications   Medication Sig Start Date End Date Taking? Authorizing Provider  allopurinol (ZYLOPRIM) 100 MG tablet Take 2 tablets (200 mg total) by mouth daily. 12/22/20  Yes [name], MD  aspirin 81 MG  EC tablet Take 81 mg by mouth daily.   Yes [provider]  Cholecalciferol (VITAMIN D) 50 MCG (2000 UT) tablet Take 2,000 Units by mouth daily.    Yes [provider]  colchicine 0.6 MG tablet TAKE 1 TABLET (0.6 MG TOTAL) BY MOUTH 2 (TWO) TIMES DAILY AS NEEDED. Patient taking differently: Take 0.6 mg by mouth 2 (two) times daily as needed (gout pain). 10/29/20  Yes [name], MD  Cyanocobalamin 2500 MCG TABS Take 2,500 mcg by mouth daily.    Yes [provider]  donepezil (ARICEPT) 5 MG tablet Take 5 mg by mouth daily.    Yes [provider]  finasteride (PROSCAR) 5 MG tablet TAKE 1 TABLET BY MOUTH DAILY Patient taking differently: Take 5 mg by mouth daily. 09/12/16  Yes [name], MD  furosemide (LASIX) 80 MG tablet Take 1 tablet (80 mg total) by mouth 2 (two) times daily. 05/04/21  Yes Corky Crafts, MD  melatonin 5 MG TABS Take 5 mg by mouth at bedtime.   Yes [provider]  polyethylene glycol (MIRALAX / GLYCOLAX) 17 g packet Take 17 g by mouth daily.   Yes [provider]  predniSONE (DELTASONE) 20 MG tablet Take 2 tablets (40 mg total) by mouth daily. For 3 days, then 1 tab daily for 3 days Patient not taking: No sig reported 01/10/21   [name], MD    Allergies    Patient has no known allergies.  Review of Systems   Review of Systems  All other systems reviewed and are negative. Ten systems reviewed and are negative for acute change, except as noted in the HPI.   Physical Exam Updated Vital Signs BP (!) 134/55   Pulse 77   Temp 98.2 F (36.8 C) (Oral)   Resp 15   SpO2 97%   Physical Exam Vitals and nursing note reviewed.  Constitutional:      General: He is not in acute distress.    Appearance: Normal appearance. He is not ill-appearing, toxic-appearing or diaphoretic.  HENT:     Head: Normocephalic and atraumatic.     Right Ear: External ear normal.     Left Ear: External ear normal.      Nose: Nose normal.     Mouth/Throat:     Mouth: Mucous membranes are moist.     Pharynx: Oropharynx is clear. No oropharyngeal exudate or posterior oropharyngeal erythema.  Eyes:     General: No scleral icterus.       Right eye: No discharge.        Left eye: No discharge.     Extraocular Movements: Extraocular movements intact.     Conjunctiva/sclera: Conjunctivae normal.  Cardiovascular:     Rate and Rhythm: Normal rate and regular rhythm.     Pulses: Normal pulses.     Heart sounds: Normal heart sounds. No murmur heard.  No friction rub. No gallop.  Pulmonary:     Effort: Pulmonary effort is normal. No respiratory distress.     Breath sounds: No stridor. No wheezing, rhonchi or rales.     Comments: Decreased lung sounds noted on the left.  Mild expiratory wheezes noted on the right. Abdominal:     General: Abdomen is flat.     Palpations: Abdomen is soft.     Tenderness: There is no abdominal tenderness.  Musculoskeletal:        General: Normal range of motion.     Cervical back: Normal range of motion and neck supple. No tenderness.  Skin:    General: Skin is warm and dry.  Neurological:     General: No focal deficit present.     Mental Status: He is alert and oriented to person, place, and time.  Psychiatric:        Mood and Affect: Mood normal.        Behavior: Behavior normal.    ED Results / Procedures / Treatments   Labs (all labs ordered are listed, but only abnormal results are displayed) Labs Reviewed  RESP PANEL BY RT-PCR (FLU A&B, COVID) [id number]    EKG EKG Interpretation  Date/Time:  Friday May 05 2021 10:26:05 EDT Ventricular Rate:  77 PR Interval:    QRS Duration: 150 QT Interval:  448 QTC Calculation: 506 R Axis:   -79 Text Interpretation: Ventricular-paced rhythm Abnormal ECG No significant change since last tracing Confirmed by Linwood Dibbles [phone]) on 05/05/2021 11:48:22 AM  Radiology DG Chest 2 View  Result Date: 05/05/2021 CLINICAL DATA:   SOB; left sided pneumo that was dx yesterday; repeat cxr EXAM: CHEST - 2 VIEW COMPARISON:  05/04/2021. FINDINGS: Similar size of a moderate left pneumothorax. Similar versus slightly increased small left pleural effusion with overlying streaky left basilar opacities. Unchanged cardiomediastinal silhouette. Atherosclerosis of the aorta. Similar position of a dual lead left subclavian approach cardiac rhythm maintenance device. Osteopenia. Polyarticular degenerative change. IMPRESSION: 1. Similar moderate left pneumothorax. 2. Similar versus slightly increased small left pleural effusion and streaky overlying left basilar opacities, which could represent atelectasis, aspiration, and/or pneumonia. Electronically Signed   By: Feliberto Harts MD   On: 05/05/2021 11:57   DG Chest 2 View  Result Date: 05/05/2021 CLINICAL DATA:  Short of breath EXAM: CHEST - 2 VIEW COMPARISON:  Prior chest x-ray 06/15/2020 FINDINGS: Left subclavian approach cardiac rhythm maintenance device with leads projecting over the right atrium and right ventricle. Cardiac and mediastinal contours remain unchanged. Atherosclerotic calcifications present in the transverse aorta. There is a left-sided pneumothorax with approximately 20-25% loss of lung volume. The pleural line is visible from the apex to the lateral base. No evidence of pneumothorax on the right. Chronic bronchitic changes are present bilaterally. No acute osseous abnormality. IMPRESSION: 1. Approximately 20-25% left-sided pneumothorax. 2. Otherwise, stable chronic bronchitic changes and left subclavian approach cardiac rhythm maintenance device. These results will be called to the ordering clinician or representative by the Radiologist Assistant, and communication documented in the PACS or [employer]. Electronically Signed   By: Malachy Moan M.D.   On: 05/05/2021 08:37    Procedures Procedures   Medications Ordered in ED Medications - No data to display  ED  Course  I have reviewed the triage vital signs and the nursing notes.  Pertinent labs & imaging results that were available during my care of the patient were reviewed by me and considered in my medical decision making (  see chart for details).  Clinical Course as of 05/05/21 1325  Fri May 05, 2021  1230 I spoke to staff who works for Dr. Dorris FetchHendrickson with cardiothoracic surgery.  They recommend chest tube placement and admission. [LJ]    Clinical Course User Index [LJ] Placido SouJoldersma, Taft Worthing, PA-C   MDM Rules/Calculators/A&P                          Pt is a 87 y.o. male who presents to the emergency department after being evaluated by cardiology for a likely left-sided pneumothorax.  Labs: Respiratory panel is pending.  CBC and CMP obtained yesterday.  Imaging: Chest x-ray shows a similar moderate left-sided pneumothorax.  Similar versus slightly increased small left pleural effusion and streaky overlying left basilar opacities which could represent atelectasis versus aspiration and/or pneumonia.  I, Placido SouLogan Alexandre Lightsey, PA-C, personally reviewed and evaluated these images and lab results as part of my medical decision-making.  Patient discussed with cardiothoracic surgery who recommends chest tube placement as well as admission to medicine.  This was discussed with interventional radiology who will place the chest tube.  Discussed with patient as well as his daughter-in-law at bedside were amenable with this plan.  Basic labs obtained by cardiology yesterday.  We will order an additional respiratory panel.  We will discussed with the medicine team for admission.  Note: Portions of this report may have been transcribed using voice recognition software. Every effort was made to ensure accuracy; however, inadvertent computerized transcription errors may be present.   Final Clinical Impression(s) / ED Diagnoses Final diagnoses:  Pneumothorax, unspecified type    Rx / DC Orders ED Discharge  Orders     None        Placido SouJoldersma, Jalexa Pifer, PA-C 05/05/21 1327    Cheryll CockayneHong, Joshua S, MD 05/17/21 2001

## 2021-05-05 NOTE — Consult Note (Signed)
Reason for Consult:Pneumothorax Referring Physician: ED, Dr. Ophelia Charter Hospitalist  Matthew Howard is an 87 y.o. male.  HPI: 87 yo man brought to ED after CXR yesterday noted to be abnormal  87 yo man with a past history of Atrial fib, dementia, gout, Mobitz typer II block with pacemaker and hernia. He fell last Sunday (5 days ago). He was having respiratory issues after that. Was short of breath on Monday and Tuesday, but wasn't able to get a doctor's appointment. DIL noted noisy breathing sounds especially in the morning and evening. Seemed to be a little better yesterday but finally got an appointment to be seen. Had a CXR which was read this morning showing a moderate left pneumothorax. Was instructed to come to ED.  He denies any pain or shortness of breath, but DIL has noted him working harder to breathe.  A repeat CXR in ED showed no change in the left pneumothorax. No rib fractures apparent.  Past Medical History:  Diagnosis Date   Advance directive discussed with patient 07/15/2015   Atrial fibrillation (HCC) 01/23/2011   Benign prostatic hyperplasia (BPH) with urinary urgency 09/11/2007   Qualifier: Diagnosis of  By: Alphonsus Sias MD, Ronnette Hila    Dementia [facility])    Gout    Hyperlipidemia    Left inguinal hernia 03/29/2020   Mobitz type II atrioventricular block 01/25/2014   Pacemaker 04/27/2014   Pedal edema 09/10/2016   Routine general medical examination at a health care facility 07/01/2013   Scrotal right inuinal hernia 08/13/2019   Symptomatic bradycardia    s/p dual chamber PPM implantation April 2015 [facility])    Past Surgical History:  Procedure Laterality Date   CATARACT EXTRACTION     PACEMAKER INSERTION  01-25-2014   STJ Assurity dual chamber pacemaker implanted by Dr [name] for symptomatic bradycardia   PERMANENT PACEMAKER INSERTION N/A 01/25/2014   Procedure: PERMANENT PACEMAKER INSERTION;  Surgeon: Marinus Maw, MD;  Location: [facility] CATH LAB;  Service:  Cardiovascular;  Laterality: N/A;   TONSILLECTOMY      Family History  Problem Relation Age of Onset   Cancer Mother    Throat cancer Father     Social History:  reports that he has never smoked. He has never used smokeless tobacco. He reports current alcohol use. He reports that he does not use drugs.  Allergies: No Known Allergies  Medications: Prior to Admission:  Medications Prior to Admission  Medication Sig Dispense Refill Last Dose   allopurinol (ZYLOPRIM) 100 MG tablet Take 2 tablets (200 mg total) by mouth daily. 30 tablet 11 05/05/2021   aspirin 81 MG EC tablet Take 81 mg by mouth daily.   05/05/2021   Cholecalciferol (VITAMIN D) 50 MCG (2000 UT) tablet Take 2,000 Units by mouth daily.    05/05/2021   colchicine 0.6 MG tablet TAKE 1 TABLET (0.6 MG TOTAL) BY MOUTH 2 (TWO) TIMES DAILY AS NEEDED. (Patient taking differently: Take 0.6 mg by mouth 2 (two) times daily as needed (gout pain).) 60 tablet 0 Past Week   Cyanocobalamin 2500 MCG TABS Take 2,500 mcg by mouth daily.    05/05/2021   donepezil (ARICEPT) 5 MG tablet Take 5 mg by mouth daily.    05/05/2021   finasteride (PROSCAR) 5 MG tablet TAKE 1 TABLET BY MOUTH DAILY (Patient taking differently: Take 5 mg by mouth daily.) 90 tablet 3 05/05/2021   furosemide (LASIX) 80 MG tablet Take 1 tablet (80 mg total) by mouth 2 (two) times  daily. 180 tablet 1 05/05/2021   melatonin 5 MG TABS Take 5 mg by mouth at bedtime.   05/04/2021   polyethylene glycol (MIRALAX / GLYCOLAX) 17 g packet Take 17 g by mouth daily.   05/04/2021    Results for orders placed or performed during the hospital encounter of 05/05/21 (from the past 48 hour(s))  CBC with Differential     Status: Abnormal   Collection Time: 05/05/21  4:12 PM  Result Value Ref Range   WBC 7.6 4.0 - 10.5 K/uL   RBC 3.27 (L) 4.22 - 5.81 MIL/uL   Hemoglobin 10.8 (L) 13.0 - 17.0 g/dL   HCT 37.1 (L) 06.2 - 69.4 %   MCV 108.0 (H) 80.0 - 100.0 fL   MCH 33.0 26.0 - 34.0 pg   MCHC 30.6 30.0  - 36.0 g/dL   RDW 85.4 (H) 62.7 - 03.5 %   Platelets 174 150 - 400 K/uL   nRBC 0.0 0.0 - 0.2 %   Neutrophils Relative % 63 %   Neutro Abs 4.7 1.7 - 7.7 K/uL   Lymphocytes Relative 20 %   Lymphs Abs 1.5 0.7 - 4.0 K/uL   Monocytes Relative 12 %   Monocytes Absolute 0.9 0.1 - 1.0 K/uL   Eosinophils Relative 5 %   Eosinophils Absolute 0.4 0.0 - 0.5 K/uL   Basophils Relative 0 %   Basophils Absolute 0.0 0.0 - 0.1 K/uL   Immature Granulocytes 0 %   Abs Immature Granulocytes 0.02 0.00 - 0.07 K/uL    Comment: Performed at [facility], [address], [place], [place] [address]  Protime-INR     Status: None   Collection Time: 05/05/21  4:12 PM  Result Value Ref Range   Prothrombin Time 13.9 11.4 - 15.2 seconds   INR 1.1 0.8 - 1.2    Comment: (NOTE) INR goal varies based on device and disease states. Performed at [facility], [address] [place], [place] [address]     DG Chest 2 View  Result Date: 05/05/2021 CLINICAL DATA:  SOB; left sided pneumo that was dx yesterday; repeat cxr EXAM: CHEST - 2 VIEW COMPARISON:  05/04/2021. FINDINGS: Similar size of a moderate left pneumothorax. Similar versus slightly increased small left pleural effusion with overlying streaky left basilar opacities. Unchanged cardiomediastinal silhouette. Atherosclerosis of the aorta. Similar position of a dual lead left subclavian approach cardiac rhythm maintenance device. Osteopenia. Polyarticular degenerative change. IMPRESSION: 1. Similar moderate left pneumothorax. 2. Similar versus slightly increased small left pleural effusion and streaky overlying left basilar opacities, which could represent atelectasis, aspiration, and/or pneumonia. Electronically Signed   By: Feliberto Harts MD   On: 05/05/2021 11:57   DG Chest 2 View  Result Date: 05/05/2021 CLINICAL DATA:  Short of breath EXAM: CHEST - 2 VIEW COMPARISON:  Prior chest x-ray 06/15/2020 FINDINGS: Left subclavian approach cardiac rhythm  maintenance device with leads projecting over the right atrium and right ventricle. Cardiac and mediastinal contours remain unchanged. Atherosclerotic calcifications present in the transverse aorta. There is a left-sided pneumothorax with approximately 20-25% loss of lung volume. The pleural line is visible from the apex to the lateral base. No evidence of pneumothorax on the right. Chronic bronchitic changes are present bilaterally. No acute osseous abnormality. IMPRESSION: 1. Approximately 20-25% left-sided pneumothorax. 2. Otherwise, stable chronic bronchitic changes and left subclavian approach cardiac rhythm maintenance device. These results will be called to the ordering clinician or representative by the Radiologist Assistant, and communication  documented in the PACS or [employer]. Electronically Signed   By: Malachy Moan M.D.   On: 05/05/2021 08:37     I personally reviewed the CXR images. Left pneumothorax, unchanged since 7/28  Review of Systems  Respiratory:  Positive for shortness of breath and wheezing.   Musculoskeletal:  Positive for arthralgias and joint swelling (gout).  Blood pressure (!) 144/57, pulse 78, temperature 97.9 F (36.6 C), temperature source Oral, resp. rate 20, SpO2 92 %. Physical Exam Vitals reviewed.  Constitutional:      General: He is not in acute distress.    Appearance: Normal appearance.  HENT:     Head: Normocephalic and atraumatic.  Eyes:     General: No scleral icterus. Cardiovascular:     Rate and Rhythm: Normal rate. Rhythm irregular.  Pulmonary:     Effort: Pulmonary effort is normal. No respiratory distress.     Breath sounds: Wheezing present.     Comments: Breath sounds diminished on left Abdominal:     General: There is no distension.     Palpations: Abdomen is soft.  Skin:    General: Skin is warm and dry.  Neurological:     General: No focal deficit present.     Mental Status: He is alert.     Cranial Nerves: No cranial  nerve deficit.     Motor: No weakness.    Assessment/Plan: 87 yo man with history of Atrial fib, dementia, gout, Mobitz typer II block with pacemaker and hernia.  Suffered 2 falls about 5 days ago and has been noted to have some difficulty breathing. A CXR was done yesterday showing a moderate left pneumothorax. Instructed to come to ED even though he was feeling better. Follow up CXR today showed no change in the pneumothorax.  Left pneumothorax secondary to trauma- likely occurred at time of fall. Noted on CXR done yesterday but not read until today. Follow up CXR today shows no change. I suspect this will resolve on its own. Discussed options of observation vs tube placement with family. They wish to opt for observation.  He has significant wheezing bilaterally on exam. I suspect that has more to do with his shortness of breath than the pneumothorax.  D/w Dr. Ophelia Charter  Will follow  Loreli Slot 05/05/2021, 4:55 PM

## 2021-05-06 ENCOUNTER — Observation Stay (HOSPITAL_COMMUNITY): Payer: Medicare Other

## 2021-05-06 DIAGNOSIS — J939 Pneumothorax, unspecified: Secondary | ICD-10-CM

## 2021-05-06 DIAGNOSIS — S270XXA Traumatic pneumothorax, initial encounter: Secondary | ICD-10-CM

## 2021-05-06 DIAGNOSIS — I5032 Chronic diastolic (congestive) heart failure: Secondary | ICD-10-CM | POA: Diagnosis not present

## 2021-05-06 LAB — BASIC METABOLIC PANEL
Anion gap: 11 (ref 5–15)
BUN: 30 mg/dL — ABNORMAL HIGH (ref 8–23)
CO2: 31 mmol/L (ref 22–32)
Calcium: 9 mg/dL (ref 8.9–10.3)
Chloride: 96 mmol/L — ABNORMAL LOW (ref 98–111)
Creatinine, Ser: 1.35 mg/dL — ABNORMAL HIGH (ref 0.61–1.24)
GFR, Estimated: 47 mL/min — ABNORMAL LOW (ref >60)
Glucose, Bld: 102 mg/dL — ABNORMAL HIGH (ref 70–99)
Potassium: 3.7 mmol/L (ref 3.5–5.1)
Sodium: 138 mmol/L (ref 135–145)

## 2021-05-06 LAB — CBC
HCT: 35.3 % — ABNORMAL LOW (ref 39.0–52.0)
Hemoglobin: 11.1 g/dL — ABNORMAL LOW (ref 13.0–17.0)
MCH: 33.1 pg (ref 26.0–34.0)
MCHC: 31.4 g/dL (ref 30.0–36.0)
MCV: 105.4 fL — ABNORMAL HIGH (ref 80.0–100.0)
Platelets: 186 10*3/uL (ref 150–400)
RBC: 3.35 MIL/uL — ABNORMAL LOW (ref 4.22–5.81)
RDW: 16.4 % — ABNORMAL HIGH (ref 11.5–15.5)
WBC: 8.1 10*3/uL (ref 4.0–10.5)
nRBC: 0 % (ref 0.0–0.2)

## 2021-05-06 MED ORDER — ALBUTEROL SULFATE HFA 108 (90 BASE) MCG/ACT IN AERS: 2.0000 | INHALATION_SPRAY | RESPIRATORY_TRACT | 0 refills | Status: AC | PRN

## 2021-05-06 NOTE — Progress Notes (Addendum)
      [address]       [place] [address]             [phone]    Subjective:  No new complaints.  Denies pain, shortness of breath.  Daughter in law at bedside, multiple questions addressed  Objective: Vital signs in last 24 hours: Temp:  [97.1 F (36.2 C)-98.2 F (36.8 C)] 97.1 F (36.2 C) (07/30 0618) Pulse Rate:  [38-78] 58 (07/30 0618) Cardiac Rhythm: Ventricular paced;Bundle branch block (07/30 0700) Resp:  [14-22] 14 (07/30 0618) BP: (125-155)/(54-69) 146/64 (07/30 0618) SpO2:  [92 %-100 %] 95 % (07/30 0618)  Intake/Output from previous day: 07/29 0701 - 07/30 0700 In: -  Out: 600 [Urine:600] Intake/Output this shift: Total I/O In: -  Out: 200 [Urine:200]  General appearance: alert, cooperative, and no distress Heart: regular rate and rhythm Lungs: clear to auscultation bilaterally and minimal wheezing  Lab Results: Recent Labs    05/05/21 1612 05/06/21 0105  WBC 7.6 8.1  HGB 10.8* 11.1*  HCT 35.3* 35.3*  PLT 174 186   BMET:  Recent Labs    05/05/21 1612 05/06/21 0105  NA 139 138  K 3.6 3.7  CL 96* 96*  CO2 35* 31  GLUCOSE 131* 102*  BUN 28* 30*  CREATININE 1.44* 1.35*  CALCIUM 8.8* 9.0    PT/INR:  Recent Labs    05/05/21 1612  LABPROT 13.9  INR 1.1   ABG No results found for: PHART, HCO3, TCO2, ACIDBASEDEF, O2SAT CBG (last 3)  No results for input(s): GLUCAP in the last 72 hours.  Assessment/Plan:  Traumatic pneumothorax- patient suffered fall several days ago, suspect this occurred at that time.  CXR today continues to show left pneumothorax that is stable to maybe slightly improved--- no indication for chest tube at this time as patient is asymptomatic Dispo- care per primary, repeat CXR in AM   LOS: 0 days    Lowella Dandy, PA-C 05/06/2021 Patient seen and examined, agree with above CXR stable left pneumothorax. Almost certainly occurred with his fall 6 days ago. Stable over past 48 hours. No indication  for intervention. I can see him in follow up in 2-3 days with a CXR to check on progress. Wheezing faint today, much improved from yesterday.  Ok from our standpoint to DC. Would consider bronchodilator if felt safe from cardiac perspective.  Salvatore Decent Dorris Fetch, MD [facility] [phone]

## 2021-05-06 NOTE — Discharge Summary (Addendum)
Physician Discharge Summary  Matthew Howard:[id number] DOB: 03-23-1919 DOA: 05/05/2021  PCP: [name], MD  Admit date: 05/05/2021 Discharge date: 05/06/2021  Admitted From: Home Disposition: Home  Recommendations for Outpatient Follow-up:  Follow up with PCP in 1 week with repeat CBC/BMP Outpatient follow-up with CT surgery/Dr. Dorris Fetch Recommend outpatient evaluation and follow-up by palliative care for goals of care discussion Follow up in ED if symptoms worsen or new appear   Home Health: No Equipment/Devices: None  Discharge Condition: Guarded CODE STATUS: Full Diet recommendation: Heart healthy  Brief/Interim Summary: 87 year old male with history of A. fib, BPH, hyperlipidemia, dementia, pacemaker placement presented with shortness of breath after a fall few days ago and was found to have pneumothorax.  CT surgery was consulted.  Repeat chest x-ray today showed stable left pneumothorax.  CT surgery has cleared the patient for discharge home with outpatient follow-up with CT surgery.  He will be discharged home today.  Discharge Diagnoses:   Traumatic pneumothorax -Probably from a fall few days ago at home -Initially the plan was for IR to place a chest tube but after discussion with family and since pneumothorax appeared to be stable, it was decided to manage it conservatively with repeat chest x-ray for today. -Respiratory status currently stable and on room air -Repeat chest x-ray today showed stable left pneumothorax.  CT surgery has cleared the patient for discharge home with outpatient follow-up with CT surgery.  He will be discharged home today.   Chronic diastolic CHF History of pacemaker -Compensated.  Continue diet and fluid restriction.  Continue Lasix 80 mg orally twice a day which is his home dose.  Outpatient follow-up with cardiology   Chronic knee disease stage IIIb -Creatinine currently stable.  Monitor  Chronic macrocytic anemia -Globin  stable.  Outpatient follow-up  Dementia -Currently stable and confused.  Continue Aricept and melatonin. -Palliative care consultation for goals of care discussion as an outpatient.  Patient had an out of facility DNR in his chart from 2020, signed by his PCP.  This was rescinded by his son at the time of admission on 05/05/2021.   Gout -Continue allopurinol along with as needed colchicine.  BPH--continue Proscar    Discharge Instructions  Discharge Instructions     Diet - low sodium heart healthy   Complete by: As directed    Increase activity slowly   Complete by: As directed       Allergies as of 05/06/2021   No Known Allergies      Medication List     TAKE these medications    albuterol 108 (90 Base) MCG/ACT inhaler Commonly known as: VENTOLIN HFA Inhale 2 puffs into the lungs every 4 (four) hours as needed for wheezing or shortness of breath.   allopurinol 100 MG tablet Commonly known as: ZYLOPRIM Take 2 tablets (200 mg total) by mouth daily.   aspirin 81 MG EC tablet Take 81 mg by mouth daily.   colchicine 0.6 MG tablet TAKE 1 TABLET (0.6 MG TOTAL) BY MOUTH 2 (TWO) TIMES DAILY AS NEEDED. What changed: reasons to take this   Cyanocobalamin 2500 MCG Tabs Take 2,500 mcg by mouth daily.   donepezil 5 MG tablet Commonly known as: ARICEPT Take 5 mg by mouth daily.   finasteride 5 MG tablet Commonly known as: PROSCAR TAKE 1 TABLET BY MOUTH DAILY   furosemide 80 MG tablet Commonly known as: LASIX Take 1 tablet (80 mg total) by mouth 2 (two) times daily.   melatonin  5 MG Tabs Take 5 mg by mouth at bedtime.   polyethylene glycol 17 g packet Commonly known as: MIRALAX / GLYCOLAX Take 17 g by mouth daily.   Vitamin D 50 MCG (2000 UT) tablet Take 2,000 Units by mouth daily.          Follow-up Information     [name], MD. Schedule an appointment as soon as possible for a visit in 1 week(s).   Specialties: Internal Medicine,  Pediatrics Contact information: [address] [place] [address] [phone]         Jake Bathe, MD .   Specialty: Cardiology Contact information: [phone] [address] [place] [address] [phone]         Loreli Slot, MD Follow up.   Specialty: Cardiothoracic Surgery Why: in the next few days. Call for appointment Contact information: [address] [place] [address] [phone]                No Known Allergies  Consultations: CT surgery.  Palliative care consultation is pending   Procedures/Studies: DG Chest 2 View  Result Date: 05/06/2021 CLINICAL DATA:  Atrial fibrillation is Pacemaker placement EXAM: CHEST - 2 VIEW COMPARISON:  None. FINDINGS: Heart size within normal limits. Left pneumothorax is is not significantly changed in size since the prior study. Is no pulmonary vascular congestion. Mild left basilar atelectasis. Left chest wall pacemaker unchanged configuration. IMPRESSION: Unchanged small left pneumothorax. Electronically Signed   By: Acquanetta Belling M.D.   On: 05/06/2021 11:54   DG Chest 2 View  Result Date: 05/05/2021 CLINICAL DATA:  Follow-up pneumothorax EXAM: CHEST - 2 VIEW COMPARISON:  04/05/2021 FINDINGS: Left pneumothorax again noted, moderate in size, seen at the apex and laterally. This is not significantly changed since prior study. Left pacer remains in place, unchanged. Heart is upper limits normal in size. Patchy airspace disease throughout the left lung. Right base atelectasis. IMPRESSION: Stable moderate left pneumothorax. Patchy opacities throughout the left lung and right lung base, likely atelectasis. Electronically Signed   By: Charlett Nose M.D.   On: 05/05/2021 18:50   DG Chest 2 View  Result Date: 05/05/2021 CLINICAL DATA:  SOB; left sided pneumo that was dx yesterday; repeat cxr EXAM: CHEST - 2 VIEW COMPARISON:  05/04/2021. FINDINGS: Similar size of a moderate left  pneumothorax. Similar versus slightly increased small left pleural effusion with overlying streaky left basilar opacities. Unchanged cardiomediastinal silhouette. Atherosclerosis of the aorta. Similar position of a dual lead left subclavian approach cardiac rhythm maintenance device. Osteopenia. Polyarticular degenerative change. IMPRESSION: 1. Similar moderate left pneumothorax. 2. Similar versus slightly increased small left pleural effusion and streaky overlying left basilar opacities, which could represent atelectasis, aspiration, and/or pneumonia. Electronically Signed   By: Feliberto Harts MD   On: 05/05/2021 11:57   DG Chest 2 View  Result Date: 05/05/2021 CLINICAL DATA:  Short of breath EXAM: CHEST - 2 VIEW COMPARISON:  Prior chest x-ray 06/15/2020 FINDINGS: Left subclavian approach cardiac rhythm maintenance device with leads projecting over the right atrium and right ventricle. Cardiac and mediastinal contours remain unchanged. Atherosclerotic calcifications present in the transverse aorta. There is a left-sided pneumothorax with approximately 20-25% loss of lung volume. The pleural line is visible from the apex to the lateral base. No evidence of pneumothorax on the right. Chronic bronchitic changes are present bilaterally. No acute osseous abnormality. IMPRESSION: 1. Approximately 20-25% left-sided pneumothorax. 2. Otherwise, stable chronic bronchitic  changes and left subclavian approach cardiac rhythm maintenance device. These results will be called to the ordering clinician or representative by the Radiologist Assistant, and communication documented in the PACS or [employer]. Electronically Signed   By: Malachy Moan M.D.   On: 05/05/2021 08:37   CUP PACEART REMOTE DEVICE CHECK  Result Date: 04/25/2021 Scheduled remote reviewed. Normal device function.  All events for AF/Flutter, burden 91%, CVR. Next remote 91 days. LR     Subjective: Patient seen and examined at bedside.   Poor historian, confused.  No overnight fever, vomiting or worsening shortness of breath reported.  Discharge Exam: Vitals:   05/06/21 0618 05/06/21 1315  BP: (!) 146/64 125/60  Pulse: (!) 58 60  Resp: 14 17  Temp: (!) 97.1 F (36.2 C) 98 F (36.7 C)  SpO2: 95% 95%    General exam: Appears calm and comfortable.  Currently on room air. Respiratory system: Bilateral decreased breath sounds at bases with some scattered crackles Cardiovascular system: S1 & S2 heard, intermittently bradycardic gastrointestinal system: Abdomen is nondistended, soft and nontender. Normal bowel sounds heard. Extremities: No cyanosis, clubbing, edema Central nervous system: Awake but confused.  Very slow to respond.  Extremely poor historian.  No focal neurological deficits. Moving extremities Skin: No rashes, lesions or ulcers Psychiatry: Could not be assessed because of mental status    The results of significant diagnostics from this hospitalization (including imaging, microbiology, ancillary and laboratory) are listed below for reference.     Microbiology: Recent Results (from the past 240 hour(s))  Resp Panel by RT-PCR (Flu A&B, Covid) Nasopharyngeal Swab     Status: None   Collection Time: 05/05/21  4:12 PM   Specimen: Nasopharyngeal Swab; Nasopharyngeal(NP) swabs in vial transport medium  Result Value Ref Range Status   SARS Coronavirus 2 by RT PCR NEGATIVE NEGATIVE Final    Comment: (NOTE) SARS-CoV-2 target nucleic acids are NOT DETECTED.  The SARS-CoV-2 RNA is generally detectable in upper respiratory specimens during the acute phase of infection. The lowest concentration of SARS-CoV-2 viral copies this assay can detect is 138 copies/mL. A negative result does not preclude SARS-Cov-2 infection and should not be used as the sole basis for treatment or other patient management decisions. A negative result may occur with  improper specimen collection/handling, submission of specimen  other than nasopharyngeal swab, presence of viral mutation(s) within the areas targeted by this assay, and inadequate number of viral copies(<138 copies/mL). A negative result must be combined with clinical observations, patient history, and epidemiological information. The expected result is Negative.  Fact Sheet for Patients:  BloggerCourse.com  Fact Sheet for Healthcare Providers:  SeriousBroker.it  This test is no t yet approved or cleared by the [place] FDA and  has been authorized for detection and/or diagnosis of SARS-CoV-2 by FDA under an Emergency Use Authorization (EUA). This EUA will remain  in effect (meaning this test can be used) for the duration of the COVID-19 declaration under Section 564(b)(1) of the Act, 21 U.S.C.section 360bbb-3(b)(1), unless the authorization is terminated  or revoked sooner.       Influenza A by PCR NEGATIVE NEGATIVE Final   Influenza B by PCR NEGATIVE NEGATIVE Final    Comment: (NOTE) The Xpert Xpress SARS-CoV-2/FLU/RSV plus assay is intended as an aid in the diagnosis of influenza from Nasopharyngeal swab specimens and should not be used as a sole basis for treatment. Nasal washings and aspirates are unacceptable for Xpert Xpress SARS-CoV-2/FLU/RSV testing.  Fact Sheet for Patients:  BloggerCourse.com  Fact Sheet for Healthcare Providers: SeriousBroker.it  This test is not yet approved or cleared by the [place] FDA and has been authorized for detection and/or diagnosis of SARS-CoV-2 by FDA under an Emergency Use Authorization (EUA). This EUA will remain in effect (meaning this test can be used) for the duration of the COVID-19 declaration under Section 564(b)(1) of the Act, 21 U.S.C. section 360bbb-3(b)(1), unless the authorization is terminated or revoked.  Performed at [facility], [address], [place],  [place] [address]      Labs: BNP (last 3 results) Recent Labs    09/05/20 1223 05/05/21 1612  BNP 296.3* 175.5*   Basic Metabolic Panel: Recent Labs  Lab 05/04/21 1218 05/05/21 1612 05/06/21 0105  NA 145* 139 138  K 4.2 3.6 3.7  CL 98 96* 96*  CO2 29 35* 31  GLUCOSE 78 131* 102*  BUN 27 28* 30*  CREATININE 1.32* 1.44* 1.35*  CALCIUM 9.2 8.8* 9.0   Liver Function Tests: Recent Labs  Lab 05/04/21 1218 05/05/21 1612  AST 20 23  ALT 11 13  ALKPHOS 71 56  BILITOT 0.7 0.6  PROT 7.0 6.6  ALBUMIN 4.1 3.3*   No results for input(s): LIPASE, AMYLASE in the last 168 hours. No results for input(s): AMMONIA in the last 168 hours. CBC: Recent Labs  Lab 05/04/21 1218 05/05/21 1612 05/06/21 0105  WBC 10.6 7.6 8.1  NEUTROABS  --  4.7  --   HGB 11.6* 10.8* 11.1*  HCT 35.5* 35.3* 35.3*  MCV 99* 108.0* 105.4*  PLT 186 174 186   Cardiac Enzymes: No results for input(s): CKTOTAL, CKMB, CKMBINDEX, TROPONINI in the last 168 hours. BNP: Invalid input(s): POCBNP CBG: No results for input(s): GLUCAP in the last 168 hours. D-Dimer No results for input(s): DDIMER in the last 72 hours. Hgb A1c No results for input(s): HGBA1C in the last 72 hours. Lipid Profile No results for input(s): CHOL, HDL, LDLCALC, TRIG, CHOLHDL, LDLDIRECT in the last 72 hours. Thyroid function studies No results for input(s): TSH, T4TOTAL, T3FREE, THYROIDAB in the last 72 hours.  Invalid input(s): FREET3 Anemia work up No results for input(s): VITAMINB12, FOLATE, FERRITIN, TIBC, IRON, RETICCTPCT in the last 72 hours. Urinalysis    Component Value Date/Time   BILIRUBINUR negative 06/15/2020 1345   PROTEINUR Positive (A) 06/15/2020 1345   UROBILINOGEN 1.0 06/15/2020 1345   NITRITE negative 06/15/2020 1345   LEUKOCYTESUR Negative 06/15/2020 1345   Sepsis Labs Invalid input(s): PROCALCITONIN,  WBC,  LACTICIDVEN Microbiology Recent Results (from the past 240 hour(s))  Resp Panel by RT-PCR (Flu A&B,  Covid) Nasopharyngeal Swab     Status: None   Collection Time: 05/05/21  4:12 PM   Specimen: Nasopharyngeal Swab; Nasopharyngeal(NP) swabs in vial transport medium  Result Value Ref Range Status   SARS Coronavirus 2 by RT PCR NEGATIVE NEGATIVE Final    Comment: (NOTE) SARS-CoV-2 target nucleic acids are NOT DETECTED.  The SARS-CoV-2 RNA is generally detectable in upper respiratory specimens during the acute phase of infection. The lowest concentration of SARS-CoV-2 viral copies this assay can detect is 138 copies/mL. A negative result does not preclude SARS-Cov-2 infection and should not be used as the sole basis for treatment or other patient management decisions. A negative result may occur with  improper specimen collection/handling, submission of specimen other than nasopharyngeal swab, presence of viral mutation(s) within the areas targeted by this assay, and inadequate number of viral copies(<138 copies/mL). A negative result must  be combined with clinical observations, patient history, and epidemiological information. The expected result is Negative.  Fact Sheet for Patients:  BloggerCourse.comhttps://www.fda.gov/media/152166/download  Fact Sheet for Healthcare Providers:  SeriousBroker.ithttps://www.fda.gov/media/152162/download  This test is no t yet approved or cleared by the [place] FDA and  has been authorized for detection and/or diagnosis of SARS-CoV-2 by FDA under an Emergency Use Authorization (EUA). This EUA will remain  in effect (meaning this test can be used) for the duration of the COVID-19 declaration under Section 564(b)(1) of the Act, 21 U.S.C.section 360bbb-3(b)(1), unless the authorization is terminated  or revoked sooner.       Influenza A by PCR NEGATIVE NEGATIVE Final   Influenza B by PCR NEGATIVE NEGATIVE Final    Comment: (NOTE) The Xpert Xpress SARS-CoV-2/FLU/RSV plus assay is intended as an aid in the diagnosis of influenza from Nasopharyngeal swab specimens and should  not be used as a sole basis for treatment. Nasal washings and aspirates are unacceptable for Xpert Xpress SARS-CoV-2/FLU/RSV testing.  Fact Sheet for Patients: BloggerCourse.comhttps://www.fda.gov/media/152166/download  Fact Sheet for Healthcare Providers: SeriousBroker.ithttps://www.fda.gov/media/152162/download  This test is not yet approved or cleared by the [place] FDA and has been authorized for detection and/or diagnosis of SARS-CoV-2 by FDA under an Emergency Use Authorization (EUA). This EUA will remain in effect (meaning this test can be used) for the duration of the COVID-19 declaration under Section 564(b)(1) of the Act, 21 U.S.C. section 360bbb-3(b)(1), unless the authorization is terminated or revoked.  Performed at [facility], [address] [place], [place] [id number]      Time coordinating discharge: 35 minutes  SIGNED:   Glade LloydKshitiz Abou Sterkel, MD  [facility] 05/06/2021, 1:41 PM

## 2021-05-06 NOTE — Progress Notes (Signed)
Patient ID: Matthew Howard, male   DOB: 06/21/1919, 87 y.o.   MRN: [id number]  PROGRESS NOTE    Matthew Howard  WUJ:[id number] DOB: 1919/04/22 DOA: 05/05/2021 PCP: [name], MD   Brief Narrative:  87 year old male with history of A. fib, BPH, hyperlipidemia, dementia, pacemaker placement presented with shortness of breath after a fall few days ago and was found to have pneumothorax.  CT surgery was consulted.  Assessment & Plan:   Traumatic pneumothorax -Probably from a fall few days ago at home -CT surgery following.  Initially the plan was for IR to place a chest tube but after discussion with family and since pneumothorax appeared to be stable, it was decided to manage it conservatively with repeat chest x-ray today. -Follow CT surgery recommendations after repeat chest x-ray today. -Respiratory status currently stable and on room air -PT eval because of recent fall at home  Chronic diastolic CHF History of pacemaker -Compensated.  Continue strict input and output.  Daily weights.  Fluid restriction.  Continue Lasix 80 mg orally twice a day which is his home dose.  Outpatient follow-up with cardiology  Chronic knee disease stage IIIb -Creatinine currently stable.  Monitor  Chronic macrocytic anemia -Globin stable.  Outpatient follow-up  Dementia -Currently stable and confused.  Continue Aricept and melatonin. -Palliative care consultation for goals of care discussion.  Patient had an out of facility DNR in his chart from 2020, signed by his PCP.  This was rescinded by his son at the time of admission on 05/05/2021.  Gout -Continue allopurinol along with as needed colchicine.  BPH--continue Proscar   DVT prophylaxis: SCDs Code Status: Full Family Communication: None at bedside Disposition Plan: Status is: Observation  The patient remains OBS appropriate and will d/c before 2 midnights.  Pending repeat chest x-ray and CT surgery recommendations  Dispo: The  patient is from: Home              Anticipated d/c is to: Home              Patient currently is not medically stable to d/c.   Difficult to place patient No   Consultants: CT surgery.  Consult palliative care  Procedures: None  Antimicrobials: None   Subjective: Patient seen and examined at bedside.  Poor historian, confused.  No overnight fever, vomiting or worsening shortness of breath reported.  Objective: Vitals:   05/05/21 1500 05/05/21 1657 05/05/21 2122 05/06/21 0618  BP: (!) 144/57 (!) 155/60 (!) 155/57 (!) 146/64  Pulse: 78 74 (!) 59 (!) 58  Resp: 20 20 16 14   Temp:  97.6 F (36.4 C) (!) 97.4 F (36.3 C) (!) 97.1 F (36.2 C)  TempSrc:  Oral    SpO2: 92% 99% 97% 95%    Intake/Output Summary (Last 24 hours) at 05/06/2021 1026 Last data filed at 05/06/2021 0827 Gross per 24 hour  Intake --  Output 800 ml  Net -800 ml   There were no vitals filed for this visit.  Examination:  General exam: Appears calm and comfortable.  Currently on room air. Respiratory system: Bilateral decreased breath sounds at bases with some scattered crackles Cardiovascular system: S1 & S2 heard, intermittently bradycardic gastrointestinal system: Abdomen is nondistended, soft and nontender. Normal bowel sounds heard. Extremities: No cyanosis, clubbing, edema  Central nervous system: Awake but confused.  Very slow to respond.  Extremely poor historian.  No focal neurological deficits. Moving extremities Skin: No rashes, lesions or ulcers Psychiatry: Could not  be assessed because of mental status   Data Reviewed: I have personally reviewed following labs and imaging studies  CBC: Recent Labs  Lab 05/04/21 1218 05/05/21 1612 05/06/21 0105  WBC 10.6 7.6 8.1  NEUTROABS  --  4.7  --   HGB 11.6* 10.8* 11.1*  HCT 35.5* 35.3* 35.3*  MCV 99* 108.0* 105.4*  PLT 186 174 186   Basic Metabolic Panel: Recent Labs  Lab 05/04/21 1218 05/05/21 1612 05/06/21 0105  NA 145* 139 138  K  4.2 3.6 3.7  CL 98 96* 96*  CO2 29 35* 31  GLUCOSE 78 131* 102*  BUN 27 28* 30*  CREATININE 1.32* 1.44* 1.35*  CALCIUM 9.2 8.8* 9.0   GFR: Estimated Creatinine Clearance: 31.8 mL/min (A) (by C-G formula based on SCr of 1.35 mg/dL (H)). Liver Function Tests: Recent Labs  Lab 05/04/21 1218 05/05/21 1612  AST 20 23  ALT 11 13  ALKPHOS 71 56  BILITOT 0.7 0.6  PROT 7.0 6.6  ALBUMIN 4.1 3.3*   No results for input(s): LIPASE, AMYLASE in the last 168 hours. No results for input(s): AMMONIA in the last 168 hours. Coagulation Profile: Recent Labs  Lab 05/05/21 1612  INR 1.1   Cardiac Enzymes: No results for input(s): CKTOTAL, CKMB, CKMBINDEX, TROPONINI in the last 168 hours. BNP (last 3 results) Recent Labs    06/15/20 1347 06/21/20 1229 05/04/21 1218  PROBNP 431.0* 213.0* 1,477*   HbA1C: No results for input(s): HGBA1C in the last 72 hours. CBG: No results for input(s): GLUCAP in the last 168 hours. Lipid Profile: No results for input(s): CHOL, HDL, LDLCALC, TRIG, CHOLHDL, LDLDIRECT in the last 72 hours. Thyroid Function Tests: No results for input(s): TSH, T4TOTAL, FREET4, T3FREE, THYROIDAB in the last 72 hours. Anemia Panel: No results for input(s): VITAMINB12, FOLATE, FERRITIN, TIBC, IRON, RETICCTPCT in the last 72 hours. Sepsis Labs: No results for input(s): PROCALCITON, LATICACIDVEN in the last 168 hours.  Recent Results (from the past 240 hour(s))  Resp Panel by RT-PCR (Flu A&B, Covid) Nasopharyngeal Swab     Status: None   Collection Time: 05/05/21  4:12 PM   Specimen: Nasopharyngeal Swab; Nasopharyngeal(NP) swabs in vial transport medium  Result Value Ref Range Status   SARS Coronavirus 2 by RT PCR NEGATIVE NEGATIVE Final    Comment: (NOTE) SARS-CoV-2 target nucleic acids are NOT DETECTED.  The SARS-CoV-2 RNA is generally detectable in upper respiratory specimens during the acute phase of infection. The lowest concentration of SARS-CoV-2 viral copies  this assay can detect is 138 copies/mL. A negative result does not preclude SARS-Cov-2 infection and should not be used as the sole basis for treatment or other patient management decisions. A negative result may occur with  improper specimen collection/handling, submission of specimen other than nasopharyngeal swab, presence of viral mutation(s) within the areas targeted by this assay, and inadequate number of viral copies(<138 copies/mL). A negative result must be combined with clinical observations, patient history, and epidemiological information. The expected result is Negative.  Fact Sheet for Patients:  BloggerCourse.com  Fact Sheet for Healthcare Providers:  SeriousBroker.it  This test is no t yet approved or cleared by the [place] FDA and  has been authorized for detection and/or diagnosis of SARS-CoV-2 by FDA under an Emergency Use Authorization (EUA). This EUA will remain  in effect (meaning this test can be used) for the duration of the COVID-19 declaration under Section 564(b)(1) of the Act, 21 U.S.C.section 360bbb-3(b)(1), unless the authorization is terminated  or revoked sooner.       Influenza A by PCR NEGATIVE NEGATIVE Final   Influenza B by PCR NEGATIVE NEGATIVE Final    Comment: (NOTE) The Xpert Xpress SARS-CoV-2/FLU/RSV plus assay is intended as an aid in the diagnosis of influenza from Nasopharyngeal swab specimens and should not be used as a sole basis for treatment. Nasal washings and aspirates are unacceptable for Xpert Xpress SARS-CoV-2/FLU/RSV testing.  Fact Sheet for Patients: BloggerCourse.comhttps://www.fda.gov/media/152166/download  Fact Sheet for Healthcare Providers: SeriousBroker.ithttps://www.fda.gov/media/152162/download  This test is not yet approved or cleared by the [place] FDA and has been authorized for detection and/or diagnosis of SARS-CoV-2 by FDA under an Emergency Use Authorization (EUA). This EUA  will remain in effect (meaning this test can be used) for the duration of the COVID-19 declaration under Section 564(b)(1) of the Act, 21 U.S.C. section 360bbb-3(b)(1), unless the authorization is terminated or revoked.  Performed at [facility], [address] [place], [place] [address]          Radiology Studies: DG Chest 2 View  Result Date: 05/05/2021 CLINICAL DATA:  Follow-up pneumothorax EXAM: CHEST - 2 VIEW COMPARISON:  04/05/2021 FINDINGS: Left pneumothorax again noted, moderate in size, seen at the apex and laterally. This is not significantly changed since prior study. Left pacer remains in place, unchanged. Heart is upper limits normal in size. Patchy airspace disease throughout the left lung. Right base atelectasis. IMPRESSION: Stable moderate left pneumothorax. Patchy opacities throughout the left lung and right lung base, likely atelectasis. Electronically Signed   By: Charlett NoseKevin  Dover M.D.   On: 05/05/2021 18:50   DG Chest 2 View  Result Date: 05/05/2021 CLINICAL DATA:  SOB; left sided pneumo that was dx yesterday; repeat cxr EXAM: CHEST - 2 VIEW COMPARISON:  05/04/2021. FINDINGS: Similar size of a moderate left pneumothorax. Similar versus slightly increased small left pleural effusion with overlying streaky left basilar opacities. Unchanged cardiomediastinal silhouette. Atherosclerosis of the aorta. Similar position of a dual lead left subclavian approach cardiac rhythm maintenance device. Osteopenia. Polyarticular degenerative change. IMPRESSION: 1. Similar moderate left pneumothorax. 2. Similar versus slightly increased small left pleural effusion and streaky overlying left basilar opacities, which could represent atelectasis, aspiration, and/or pneumonia. Electronically Signed   By: Feliberto HartsFrederick S Jones MD   On: 05/05/2021 11:57   DG Chest 2 View  Result Date: 05/05/2021 CLINICAL DATA:  Short of breath EXAM: CHEST - 2 VIEW COMPARISON:  Prior chest x-ray 06/15/2020  FINDINGS: Left subclavian approach cardiac rhythm maintenance device with leads projecting over the right atrium and right ventricle. Cardiac and mediastinal contours remain unchanged. Atherosclerotic calcifications present in the transverse aorta. There is a left-sided pneumothorax with approximately 20-25% loss of lung volume. The pleural line is visible from the apex to the lateral base. No evidence of pneumothorax on the right. Chronic bronchitic changes are present bilaterally. No acute osseous abnormality. IMPRESSION: 1. Approximately 20-25% left-sided pneumothorax. 2. Otherwise, stable chronic bronchitic changes and left subclavian approach cardiac rhythm maintenance device. These results will be called to the ordering clinician or representative by the Radiologist Assistant, and communication documented in the PACS or [employer] Dashboard. Electronically Signed   By: Malachy MoanHeath  McCullough M.D.   On: 05/05/2021 08:37        Scheduled Meds:  allopurinol  200 mg Oral Daily   docusate sodium  100 mg Oral BID   donepezil  5 mg Oral Daily   finasteride  5 mg Oral Daily   furosemide  80 mg  Oral BID   melatonin  5 mg Oral QHS   polyethylene glycol  17 g Oral Daily   sodium chloride flush  3 mL Intravenous Q12H   Continuous Infusions:        Glade Lloyd, MD [facility] 05/06/2021, 10:26 AM

## 2021-05-08 ENCOUNTER — Telehealth: Payer: Self-pay

## 2021-05-08 NOTE — Telephone Encounter (Signed)
Noted I will plan to see him for his routine visit unless things change or he is having any problems

## 2021-05-08 NOTE — Telephone Encounter (Signed)
Transition Care Management Follow-up Telephone Call Date of discharge and from where: 05/06/2021, [facility] How have you been since you were released from the hospital? Spoke with son [place]on), HIPAA verified. Patient is doing very well. No complaints at this time.  Any questions or concerns? No  Items Reviewed: Did the pt receive and understand the discharge instructions provided? Yes  Medications obtained and verified? Yes  Other? No  Any new allergies since your discharge? No  Dietary orders reviewed? Yes Do you have support at home? Yes   Home Care and Equipment/Supplies: Were home health services ordered? not applicable If so, what is the name of the agency? N/A  Has the agency set up a time to come to the patient's home? not applicable Were any new equipment or medical supplies ordered?  No What is the name of the medical supply agency? N/A Were you able to get the supplies/equipment? not applicable Do you have any questions related to the use of the equipment or supplies? No  Functional Questionnaire: (I = Independent and D = Dependent) ADLs: I  Bathing/Dressing- I  Meal Prep- I  Eating- I  Maintaining continence- I  Transferring/Ambulation- I  Managing Meds- D  Follow up appointments reviewed:  PCP Hospital f/u appt confirmed? No  Son did not think patient needed to follow up with PCP at this time. Patient is following up with Dr. Dorris Fetch concerning this issue. Specialist Hospital f/u appt confirmed? Yes  Scheduled to see Dr. Dorris Fetch on 05/09/2021 @ 11 am. Are transportation arrangements needed? No  If their condition worsens, is the pt aware to call PCP or go to the Emergency Dept.? Yes Was the patient provided with contact information for the PCP's office or ED? Yes Was to pt encouraged to call back with questions or concerns? Yes

## 2021-05-09 ENCOUNTER — Other Ambulatory Visit: Payer: Self-pay | Admitting: Thoracic Surgery (Cardiothoracic Vascular Surgery)

## 2021-05-09 ENCOUNTER — Ambulatory Visit
Admission: RE | Admit: 2021-05-09 | Discharge: 2021-05-09 | Disposition: A | Discharge status: Home / Self Care | Payer: Medicare Other | Source: Ambulatory Visit | Attending: Thoracic Surgery (Cardiothoracic Vascular Surgery) | Admitting: Thoracic Surgery (Cardiothoracic Vascular Surgery)

## 2021-05-09 ENCOUNTER — Other Ambulatory Visit: Payer: Self-pay

## 2021-05-09 ENCOUNTER — Ambulatory Visit: Payer: Medicare Other | Admitting: Thoracic Surgery (Cardiothoracic Vascular Surgery)

## 2021-05-09 ENCOUNTER — Encounter: Payer: Self-pay | Admitting: Thoracic Surgery (Cardiothoracic Vascular Surgery)

## 2021-05-09 VITALS — BP 100/57 | HR 70 | Resp 20 | Ht 71.0 in | Wt 175.0 lb

## 2021-05-09 DIAGNOSIS — J9311 Primary spontaneous pneumothorax: Secondary | ICD-10-CM

## 2021-05-09 LAB — CBC WITH DIFFERENTIAL/PLATELET
Absolute Monocytes: 1373 cells/uL — ABNORMAL HIGH (ref 200–950)
Basophils Absolute: 38 cells/uL (ref 0–200)
Basophils Relative: 0.4 %
Eosinophils Absolute: 470 cells/uL (ref 15–500)
Eosinophils Relative: 4.9 %
HCT: 37 % — ABNORMAL LOW (ref 38.5–50.0)
Hemoglobin: 11.9 g/dL — ABNORMAL LOW (ref 13.2–17.1)
Lymphs Abs: 2323 cells/uL (ref 850–3900)
MCH: 32.6 pg (ref 27.0–33.0)
MCHC: 32.2 g/dL (ref 32.0–36.0)
MCV: 101.4 fL — ABNORMAL HIGH (ref 80.0–100.0)
MPV: 12.3 fL (ref 7.5–12.5)
Monocytes Relative: 14.3 %
Neutro Abs: 5395 cells/uL (ref 1500–7800)
Neutrophils Relative %: 56.2 %
Platelets: 220 10*3/uL (ref 140–400)
RBC: 3.65 10*6/uL — ABNORMAL LOW (ref 4.20–5.80)
RDW: 14.2 % (ref 11.0–15.0)
Total Lymphocyte: 24.2 %
WBC: 9.6 10*3/uL (ref 3.8–10.8)

## 2021-05-09 NOTE — Progress Notes (Signed)
[address]       [place] [address]             [phone]     HPI: [name] returns for scheduled follow-up visit after recent fall with pneumothorax  Chaka Jefferys is a 87 year old man with a history of atrial fibrillation, Mobitz type II block, pacemaker, dementia, gout, and hernia.  He fell on Sunday, July 24.  His daughter-in-law as well as over the next several days that he was having noisy breathing particularly in the morning in the evening.  She tried get him in with a physician but was unable to.  He was a little better on Thursday but he did go see the doctor.  Chest x-ray was done.  The next morning the radiologist read the x-ray showing a pneumothorax and he went to the emergency room.  A follow-up chest x-ray showed no change.  He was observed overnight.  His x-ray was unchanged the following day.  He has been home since Saturday.  He has been doing well.  His daughter-in-law thinks he needs to be under observation and somebody walk with him.  I advised him that that was a good idea.  He has not had any problems with his breathing and "noisy" breathing has improved since he was discharged from the hospital.  Daughter-in-law has noted a large bruise on his left flank.  Past Medical History:  Diagnosis Date   Advance directive discussed with patient 07/15/2015   Atrial fibrillation (HCC) 01/23/2011   Benign prostatic hyperplasia (BPH) with urinary urgency 09/11/2007   Qualifier: Diagnosis of  By: Alphonsus Sias MD, Ronnette Hila    Dementia [facility])    Gout    Hyperlipidemia    Left inguinal hernia 03/29/2020   Mobitz type II atrioventricular block 01/25/2014   Pacemaker 04/27/2014   Pedal edema 09/10/2016   Routine general medical examination at a health care facility 07/01/2013   Scrotal right inuinal hernia 08/13/2019   Symptomatic bradycardia    s/p dual chamber PPM implantation April 2015 [facility])    Current Outpatient Medications   Medication Sig Dispense Refill   albuterol (VENTOLIN HFA) 108 (90 Base) MCG/ACT inhaler Inhale 2 puffs into the lungs every 4 (four) hours as needed for wheezing or shortness of breath. 8 g 0   allopurinol (ZYLOPRIM) 100 MG tablet Take 2 tablets (200 mg total) by mouth daily. 30 tablet 11   aspirin 81 MG EC tablet Take 81 mg by mouth daily.     Cholecalciferol (VITAMIN D) 50 MCG (2000 UT) tablet Take 2,000 Units by mouth daily.      colchicine 0.6 MG tablet TAKE 1 TABLET (0.6 MG TOTAL) BY MOUTH 2 (TWO) TIMES DAILY AS NEEDED. 60 tablet 0   Cyanocobalamin 2500 MCG TABS Take 2,500 mcg by mouth daily.      donepezil (ARICEPT) 5 MG tablet Take 5 mg by mouth daily.      finasteride (PROSCAR) 5 MG tablet TAKE 1 TABLET BY MOUTH DAILY 90 tablet 3   furosemide (LASIX) 80 MG tablet Take 1 tablet (80 mg total) by mouth 2 (two) times daily. 180 tablet 1   melatonin 5 MG TABS Take 5 mg by mouth at bedtime.     polyethylene glycol (MIRALAX / GLYCOLAX) 17 g packet Take 17 g by mouth daily.     No current facility-administered medications for this visit.    Physical Exam BP (!) 100/57 (BP Location: Right  Arm, Patient Position: Sitting, Cuff Size: Normal)   Pulse 70   Resp 20   Ht 5\' 11"  (1.803 m)   Wt 175 lb (79.4 kg)   SpO2 96% Comment: RA  BMI 24.26 kg/m  87 year old man in no acute distress Alert and oriented x3 no focal neurodeficit Diminished breath sounds on left Cardiac regular rate and rhythm Ecchymosis left flank Abdomen benign  Diagnostic Tests: CHEST - 2 VIEW   COMPARISON:  05/06/2021   FINDINGS: The mediastinal contours are within normal limits. No cardiomegaly. Unchanged appearance of left chest wall, subclavian vein approach dual lead pacemaker leads in unchanged positions. Persistent small left pneumothorax, slightly decreased from comparison. Slight interval increase trace left pleural effusion. Bibasilar subsegmental atelectasis. Atherosclerotic calcification of  the aortic arch. No acute osseous abnormality.   IMPRESSION: 1. Small left hydropneumothorax with pneumothorax component slightly decreased from comparison and pleural effusion component slightly increased from comparison. 2. Bibasilar subsegmental atelectasis. 3.  Aortic Atherosclerosis (ICD10-I70.0).     Electronically Signed   By: 05/08/2021 MD   On: 05/09/2021 11:12 I personally reviewed the chest x-ray images.  The pneumothorax has decreased in size.  There is some pleural effusion inferiorly.  Impression: Matthew Howard is a 87 year old man with history of atrial fibrillation, Mobitz type II block, pacemaker, dementia, gout, and hernia.  He had a fall about 9 days ago.  He was finally seen about 4 days after that and had a small pneumothorax.  That was not seen initially but was noticed by radiologist the following day.  He went to the emergency room where chest x-ray showed no significant change.  He was observed in the hospital overnight.  His x-ray the following day was stable and he went home.  Pneumothorax-chest x-ray today shows pneumothorax is smaller.  He does have some pleural effusion on the left side.  We will plan to do another chest x-ray in about a week to check on that.  His daughter was very concerned about a large ecchymosis on the left flank.  I explained that the evolution of bruises often swell more superficially later in the course.  She is concerned about the possibility of internal bleeding.  There is no way to rule that out.  I recommended we get a CBC.  If he has any issues at all he should go to the emergency room to get further evaluated.  Plan: Check CBC Return in 1 week with PA and lateral chest x-ray Advised to take to emergency room if any chest pain, shortness of breath, dizziness, weakness, abdominal discomfort.  83, MD [facility] [phone]

## 2021-05-09 NOTE — Telephone Encounter (Signed)
Spoke to pt's DIL, Gavin Pound. She said he has an appt next week to f/u with the thoracic doctor. They did a CBC today. I advised her make sure when he goes back next week to get the metabolic panel done. She will let us know if they have any issues.

## 2021-05-10 ENCOUNTER — Encounter: Payer: Self-pay | Admitting: Thoracic Surgery (Cardiothoracic Vascular Surgery)

## 2021-05-10 NOTE — Progress Notes (Signed)
      [address]       [place] [address]             [phone]      Hgb 11.9 and Hct 37 on CBC yesterday, up slightly from hospital  Viviann Spare C. Dorris Fetch, MD [facility] [phone]

## 2021-05-11 ENCOUNTER — Encounter: Payer: Self-pay | Admitting: Thoracic Surgery (Cardiothoracic Vascular Surgery)

## 2021-05-16 ENCOUNTER — Other Ambulatory Visit: Payer: Self-pay | Admitting: Thoracic Surgery (Cardiothoracic Vascular Surgery)

## 2021-05-16 DIAGNOSIS — J9311 Primary spontaneous pneumothorax: Secondary | ICD-10-CM

## 2021-05-17 ENCOUNTER — Ambulatory Visit: Payer: Medicare Other | Admitting: Surgical

## 2021-05-17 ENCOUNTER — Ambulatory Visit
Admission: RE | Admit: 2021-05-17 | Discharge: 2021-05-17 | Disposition: A | Discharge status: Home / Self Care | Payer: Medicare Other | Source: Ambulatory Visit | Attending: Thoracic Surgery (Cardiothoracic Vascular Surgery) | Admitting: Thoracic Surgery (Cardiothoracic Vascular Surgery)

## 2021-05-17 ENCOUNTER — Other Ambulatory Visit: Payer: Self-pay

## 2021-05-17 VITALS — BP 106/43 | HR 70 | Resp 20 | Ht 71.0 in | Wt 172.0 lb

## 2021-05-17 DIAGNOSIS — J9311 Primary spontaneous pneumothorax: Secondary | ICD-10-CM

## 2021-05-17 NOTE — Progress Notes (Signed)
Established Patient Office Visit  Subjective:  Patient ID: Matthew Howard, male    DOB: [birth year]  Age: 87 y.o. MRN: [id number]  CC:  Chief Complaint  Patient presents with   Spontaneous Pneumothorax    1 week f/u with CXR    HPI Matthew Howard presents for follow-up visit is small left-sided pneumothorax.  He remains in his overall baseline health with some minor complaints of shortness of breath at times.  Is primarily difficulty at this time is a acute flare of gout which is limiting his mobility and causing some pain.  Past Medical History:  Diagnosis Date   Advance directive discussed with patient 07/15/2015   Atrial fibrillation ([place]) 01/23/2011   Benign prostatic hyperplasia (BPH) with urinary urgency 09/11/2007   Qualifier: Diagnosis of  By: Silvio Pate MD, [facility] Cancer    Dementia [facility])    Gout    Hyperlipidemia    Left inguinal hernia 03/29/2020   Mobitz type II atrioventricular block 01/25/2014   Pacemaker 04/27/2014   Pedal edema 09/10/2016   Routine general medical examination at a health care facility 07/01/2013   Scrotal right inuinal hernia 08/13/2019   Symptomatic bradycardia    s/p dual chamber PPM implantation April 2015 ([place])    Past Surgical History:  Procedure Laterality Date   CATARACT EXTRACTION     PACEMAKER INSERTION  01-25-2014   STJ Assurity dual chamber pacemaker implanted by Dr Lovena Le for symptomatic bradycardia   PERMANENT PACEMAKER INSERTION N/A 01/25/2014   Procedure: PERMANENT PACEMAKER INSERTION;  Surgeon: Evans Lance, MD;  Location: [facility] CATH LAB;  Service: Cardiovascular;  Laterality: N/A;   TONSILLECTOMY      Family History  Problem Relation Age of Onset   Cancer Mother    Throat cancer Father     Social History   Socioeconomic History   Marital status: Widowed    Spouse name: Matthew Howard   Number of children: 4   Years of education: Not on file   Highest education level: Not on file  Occupational History    Occupation: retired    Comment: [employer]  Tobacco Use   Smoking status: Never   Smokeless tobacco: Never  Vaping Use   Vaping Use: Never used  Substance and Sexual Activity   Alcohol use: Yes    Comment: rare etoh use   Drug use: No   Sexual activity: Not on file  Other Topics Concern   Not on file  Social History Narrative   Widowed 4/15   No living will   Health care power of attorney is son Matthew Howard. Daughter Matthew Howard is financial POA    He feels he is ready for DNR---form done 08/13/19 (but he isn't sure he will put it up)   No tube feeds if cognitively unaware   Social Determinants of Health   Financial Resource Strain: Not on file  Food Insecurity: Not on file  Transportation Needs: Not on file  Physical Activity: Not on file  Stress: Not on file  Social Connections: Not on file  Intimate Partner Violence: Not on file    Outpatient Medications Prior to Visit  Medication Sig Dispense Refill   albuterol (VENTOLIN HFA) 108 (90 Base) MCG/ACT inhaler Inhale 2 puffs into the lungs every 4 (four) hours as needed for wheezing or shortness of breath. 8 g 0   allopurinol (ZYLOPRIM) 100 MG tablet Take 2 tablets (200 mg total) by mouth daily. 30 tablet 11   aspirin 81  MG EC tablet Take 81 mg by mouth daily.     Cholecalciferol (VITAMIN D) 50 MCG (2000 UT) tablet Take 2,000 Units by mouth daily.      colchicine 0.6 MG tablet TAKE 1 TABLET (0.6 MG TOTAL) BY MOUTH 2 (TWO) TIMES DAILY AS NEEDED. 60 tablet 0   Cyanocobalamin 2500 MCG TABS Take 2,500 mcg by mouth daily.      donepezil (ARICEPT) 5 MG tablet Take 5 mg by mouth daily.      finasteride (PROSCAR) 5 MG tablet TAKE 1 TABLET BY MOUTH DAILY 90 tablet 3   furosemide (LASIX) 80 MG tablet Take 1 tablet (80 mg total) by mouth 2 (two) times daily. 180 tablet 1   melatonin 5 MG TABS Take 5 mg by mouth at bedtime.     polyethylene glycol (MIRALAX / GLYCOLAX) 17 g packet Take 17 g by mouth daily.     No facility-administered  medications prior to visit.    DG Chest 2 View  Result Date: 05/17/2021 CLINICAL DATA:  Pneumothorax EXAM: CHEST - 2 VIEW COMPARISON:  Chest x-ray dated May 09, 2021 FINDINGS: Unchanged cardiac and mediastinal contours. Dual left chest wall pacer with unchanged lead position. Redemonstrated left hydropneumothorax with decreased size of the air component and slight increased size of fluid component. Bibasilar atelectasis. IMPRESSION: Redemonstrated left hydropneumothorax with decreased size of the air component and slight increased size of fluid component. Electronically Signed   By: Yetta Glassman MD   On: 05/17/2021 16:17   DG Chest 2 View  Result Date: 05/09/2021 CLINICAL DATA:  87 year old male with history of pneumothorax. EXAM: CHEST - 2 VIEW COMPARISON:  05/06/2021 FINDINGS: The mediastinal contours are within normal limits. No cardiomegaly. Unchanged appearance of left chest wall, subclavian vein approach dual lead pacemaker leads in unchanged positions. Persistent small left pneumothorax, slightly decreased from comparison. Slight interval increase trace left pleural effusion. Bibasilar subsegmental atelectasis. Atherosclerotic calcification of the aortic arch. No acute osseous abnormality. IMPRESSION: 1. Small left hydropneumothorax with pneumothorax component slightly decreased from comparison and pleural effusion component slightly increased from comparison. 2. Bibasilar subsegmental atelectasis. 3.  Aortic Atherosclerosis (ICD10-I70.0). Electronically Signed   By: Ruthann Cancer MD   On: 05/09/2021 11:12   DG Chest 2 View  Result Date: 05/06/2021 CLINICAL DATA:  Atrial fibrillation is Pacemaker placement EXAM: CHEST - 2 VIEW COMPARISON:  None. FINDINGS: Heart size within normal limits. Left pneumothorax is is not significantly changed in size since the prior study. Is no pulmonary vascular congestion. Mild left basilar atelectasis. Left chest wall pacemaker unchanged configuration.  IMPRESSION: Unchanged small left pneumothorax. Electronically Signed   By: Miachel Roux M.D.   On: 05/06/2021 11:54   DG Chest 2 View  Result Date: 05/05/2021 CLINICAL DATA:  Follow-up pneumothorax EXAM: CHEST - 2 VIEW COMPARISON:  04/05/2021 FINDINGS: Left pneumothorax again noted, moderate in size, seen at the apex and laterally. This is not significantly changed since prior study. Left pacer remains in place, unchanged. Heart is upper limits normal in size. Patchy airspace disease throughout the left lung. Right base atelectasis. IMPRESSION: Stable moderate left pneumothorax. Patchy opacities throughout the left lung and right lung base, likely atelectasis. Electronically Signed   By: Rolm Baptise M.D.   On: 05/05/2021 18:50   DG Chest 2 View  Result Date: 05/05/2021 CLINICAL DATA:  SOB; left sided pneumo that was dx yesterday; repeat cxr EXAM: CHEST - 2 VIEW COMPARISON:  05/04/2021. FINDINGS: Similar size of a moderate left pneumothorax.  Similar versus slightly increased small left pleural effusion with overlying streaky left basilar opacities. Unchanged cardiomediastinal silhouette. Atherosclerosis of the aorta. Similar position of a dual lead left subclavian approach cardiac rhythm maintenance device. Osteopenia. Polyarticular degenerative change. IMPRESSION: 1. Similar moderate left pneumothorax. 2. Similar versus slightly increased small left pleural effusion and streaky overlying left basilar opacities, which could represent atelectasis, aspiration, and/or pneumonia. Electronically Signed   By: Margaretha Sheffield MD   On: 05/05/2021 11:57   DG Chest 2 View  Result Date: 05/05/2021 CLINICAL DATA:  Short of breath EXAM: CHEST - 2 VIEW COMPARISON:  Prior chest x-ray 06/15/2020 FINDINGS: Left subclavian approach cardiac rhythm maintenance device with leads projecting over the right atrium and right ventricle. Cardiac and mediastinal contours remain unchanged. Atherosclerotic calcifications present in  the transverse aorta. There is a left-sided pneumothorax with approximately 20-25% loss of lung volume. The pleural line is visible from the apex to the lateral base. No evidence of pneumothorax on the right. Chronic bronchitic changes are present bilaterally. No acute osseous abnormality. IMPRESSION: 1. Approximately 20-25% left-sided pneumothorax. 2. Otherwise, stable chronic bronchitic changes and left subclavian approach cardiac rhythm maintenance device. These results will be called to the ordering clinician or representative by the Radiologist Assistant, and communication documented in the PACS or [employer]. Electronically Signed   By: Jacqulynn Cadet M.D.   On: 05/05/2021 08:37   CUP PACEART REMOTE DEVICE CHECK  Result Date: 04/25/2021 Scheduled remote reviewed. Normal device function.  All events for AF/Flutter, burden 91%, CVR. Next remote 91 days. LR    No Known Allergies  ROS As per history    Objective:    Physical Exam Constitutional:      Appearance: He is normal weight.  HENT:     Head: Normocephalic and atraumatic.  Cardiovascular:     Rate and Rhythm: Normal rate and regular rhythm.     Heart sounds: No murmur heard. Pulmonary:     Effort: Pulmonary effort is normal.     Comments: Left-sided basilar crackles Neurological:     Mental Status: He is alert.    BP (!) 106/43   Pulse 70   Resp 20   Ht 5' 11"  (1.803 m)   Wt 172 lb (78 kg)   SpO2 99% Comment: RA  BMI 23.99 kg/m  Wt Readings from Last 3 Encounters:  05/17/21 172 lb (78 kg)  05/09/21 175 lb (79.4 kg)  05/04/21 176 lb 3.2 oz (79.9 kg)     Health Maintenance Due  Topic Date Due   Zoster Vaccines- Shingrix (1 of 2) Never done   COVID-19 Vaccine (5 - Booster for Moderna series) 05/04/2021   INFLUENZA VACCINE  05/08/2021    There are no preventive care reminders to display for this patient.  Lab Results  Component Value Date   TSH 2.59 07/01/2013   Lab Results  Component Value Date    WBC 9.6 05/09/2021   HGB 11.9 (L) 05/09/2021   HCT 37.0 (L) 05/09/2021   MCV 101.4 (H) 05/09/2021   PLT 220 05/09/2021   Lab Results  Component Value Date   NA 138 05/06/2021   K 3.7 05/06/2021   CO2 31 05/06/2021   GLUCOSE 102 (H) 05/06/2021   BUN 30 (H) 05/06/2021   CREATININE 1.35 (H) 05/06/2021   BILITOT 0.6 05/05/2021   ALKPHOS 56 05/05/2021   AST 23 05/05/2021   ALT 13 05/05/2021   PROT 6.6 05/05/2021   ALBUMIN 3.3 (L) 05/05/2021  CALCIUM 9.0 05/06/2021   ANIONGAP 11 05/06/2021   EGFR 48 (L) 05/04/2021   GFR 53.73 (L) 06/21/2020   Lab Results  Component Value Date   CHOL 141 11/01/2008   Lab Results  Component Value Date   HDL 43.4 11/01/2008   Lab Results  Component Value Date   LDLCALC 77 11/01/2008   Lab Results  Component Value Date   TRIG 104 11/01/2008   Lab Results  Component Value Date   CHOLHDL 3.2 CALC 11/01/2008   No results found for: HGBA1C    Assessment & Plan:   Problem List Items Addressed This Visit     Pneumothorax - Primary   There is no surgical action required at this time as it relates to the pneumothorax.  He should continue to have his cardiovascular status monitored closely by cardiology and primary care.  The effusion appears pretty similar in size and probably not big enough at this point for thoracentesis.  We will see him again on a as needed basis.  Follow-up: No follow-ups on file.    John Giovanni, PA-C

## 2021-05-17 NOTE — Patient Instructions (Signed)
F/u prn

## 2021-05-17 NOTE — Progress Notes (Signed)
Remote pacemaker transmission.   

## 2021-05-18 ENCOUNTER — Ambulatory Visit: Payer: Medicare Other | Admitting: Thoracic Surgery (Cardiothoracic Vascular Surgery)

## 2021-05-31 ENCOUNTER — Telehealth: Payer: Self-pay | Admitting: *Deleted

## 2021-05-31 NOTE — Telephone Encounter (Signed)
Called and spoke to patient's son and was advised that his dad is doing okay now. Patient's son stated that they gave his dad some water to drink because he may have been a little dehydrated. Patient's son stated that they are not going to call EMS that was advised by Access Nurse. Patient's son stated that they have scheduled an appointment with Dr. Alphonsus Sias tomorrow 06/01/21 AT 2:45 PM. Patient's son was given ER precautions and he verbalized understanding. Patient's son was advised that this message will go back to Dr. Alphonsus Sias

## 2021-05-31 NOTE — Telephone Encounter (Signed)
That sounds okay Certainly want to avoid the ER if we can. If he is stable, can wait for the appt here tomorrow

## 2021-05-31 NOTE — Telephone Encounter (Signed)
PLEASE NOTE: All timestamps contained within this report are represented as [place] Standard Time. CONFIDENTIALTY NOTICE: This fax transmission is intended only for the addressee. It contains information that is legally privileged, confidential or otherwise protected from use or disclosure. If you are not the intended recipient, you are strictly prohibited from reviewing, disclosing, copying using or disseminating any of this information or taking any action in reliance on or regarding this information. If you have received this fax in error, please notify us immediately by telephone so that we can arrange for its return to [place]. Phone: [phone], Toll-Free: [phone], Fax: [phone] Page: 1 of 2 Call Id: [id number] [facility] Day - Client TELEPHONE ADVICE RECORD Access[facility]urse Patient Name: Matthew Howard Gender: Male DOB: 08-01-1919 Age: 87 Y 10 M 9 D Return Phone Number: [phone] (Primary), [phone] (Secondary) Address: City/ [place]/ [place]  [address] Client [facility] Day - Client Client Site [facility] [place] - Day Physician Tillman Abide- MD Contact Type Call Who Is Calling Patient / Member / Family / Caregiver Call Type Triage / Clinical Caller Name Kenyetta Fife Relationship To Patient Daughter Return Phone Number [phone] (Primary) Chief Complaint FAINTING or PASSING OUT Reason for Call Symptomatic / Request for Health Information Initial Comment Caller states her dad has had two falls. Caller states on Monday he fell on his knees and his knee is very sore and today he passed out in the shower. Caller states he has an appt. tomorrow. Additional Comment Caller was transferred over from the ofifce for triage. Translation No Nurse Assessment Nurse: Stefano Gaul, RN, Dwana Curd Date/Time (Eastern Time): 05/31/2021 10:57:38 AM Confirm and document reason for call. If symptomatic,  describe symptoms. ---Caller states father fell on Monday on his knees. Today he has pain in his right knee. He almost passed out in the shower and daughter caught him and she let him down to the floor. he has eaten breakfast and seems better. he is on 160 mg furosemide. pt has appt at 2:45 pm tomorrow. Does the patient have any new or worsening symptoms? ---Yes Will a triage be completed? ---Yes Related visit to physician within the last 2 weeks? ---No Does the PT have any chronic conditions? (i.e. diabetes, asthma, this includes High risk factors for pregnancy, etc.) ---Yes List chronic conditions. ---A fib; pacemaker; diastolic heart failure Is this a behavioral health or substance abuse call? ---No Guidelines Guideline Title Affirmed Question Affirmed Notes Nurse Date/Time [name] Time) Fainting History of heart problems (e.g., Stefano Gaul, RN, Vera 05/31/2021 11:01:39 AM PLEASE NOTE: All timestamps contained within this report are represented as [place] Standard Time. CONFIDENTIALTY NOTICE: This fax transmission is intended only for the addressee. It contains information that is legally privileged, confidential or otherwise protected from use or disclosure. If you are not the intended recipient, you are strictly prohibited from reviewing, disclosing, copying using or disseminating any of this information or taking any action in reliance on or regarding this information. If you have received this fax in error, please notify us immediately by telephone so that we can arrange for its return to [place]. Phone: [phone], Toll-Free: [phone], Fax: [phone] Page: 2 of 2 Call Id: [id number] Guidelines Guideline Title Affirmed Question Affirmed Notes Nurse Date/Time [name] Time) congestive heart failure, heart attack) Disp. Time [name] Time) Disposition Final User 05/31/2021 10:56:11 AM Send to Urgent Sharlyn Bologna 05/31/2021 11:06:51 AM Call EMS 911 Now Yes Stefano Gaul, RN,  Clerance Lav Disagree/Comply Disagree Caller Understands  Yes PreDisposition Call Doctor Care Advice Given Per Guideline CALL EMS 911 NOW: * Immediate medical attention is needed. You need to hang up and call 911 (or an ambulance). CARE ADVICE given per Fainting (Adult) guideline. Comments User: Art Buff, RN Date/Time [name] Time): 05/31/2021 11:14:56 AM Called office and spoke to [place] and gave report that pt had 911 outcome but daughter does not want to call 911 or go to the ER. User: Art Buff, RN Date/Time [name] Time): 05/31/2021 11:15:24 AM daughter states pt will keep his appt tomorrow Referrals GO TO FACILITY REFUSED

## 2021-06-01 ENCOUNTER — Encounter: Payer: Self-pay | Admitting: Internal Medicine

## 2021-06-01 ENCOUNTER — Other Ambulatory Visit: Payer: Self-pay

## 2021-06-01 ENCOUNTER — Ambulatory Visit: Payer: Medicare Other | Admitting: Internal Medicine

## 2021-06-01 VITALS — BP 120/82 | HR 60 | Temp 96.9°F | Ht 71.0 in | Wt 170.0 lb

## 2021-06-01 DIAGNOSIS — I5032 Chronic diastolic (congestive) heart failure: Secondary | ICD-10-CM

## 2021-06-01 DIAGNOSIS — N1831 Chronic kidney disease, stage 3a: Secondary | ICD-10-CM | POA: Diagnosis not present

## 2021-06-01 DIAGNOSIS — R296 Repeated falls: Secondary | ICD-10-CM | POA: Diagnosis not present

## 2021-06-01 DIAGNOSIS — F0150 Vascular dementia without behavioral disturbance: Secondary | ICD-10-CM | POA: Diagnosis not present

## 2021-06-01 DIAGNOSIS — M10061 Idiopathic gout, right knee: Secondary | ICD-10-CM

## 2021-06-01 MED ORDER — PREDNISONE 20 MG PO TABS: 40.0000 mg | ORAL_TABLET | Freq: Every day | ORAL | 0 refills | Status: DC

## 2021-06-01 NOTE — Patient Instructions (Signed)
Give the furosemide 80 mg in the morning----and then only give the lunch dose if his home weight is 175# or more that day.

## 2021-06-01 NOTE — Assessment & Plan Note (Signed)
Right knee looks more like gout than contusion Will give 6 days of prednisone Check uric acid and increase allopurinol if not at least under 7

## 2021-06-01 NOTE — Progress Notes (Signed)
Subjective:    Patient ID: Matthew Howard, male    DOB: 11/10/1918, 87 y.o.   MRN: [id number]  HPI Here with DIL Gavin Pound due to recent falls This visit occurred during the SARS-CoV-2 public health emergency.  Safety protocols were in place, including screening questions prior to the visit, additional usage of staff PPE, and extensive cleaning of exam room while observing appropriate contact time as indicated for disinfecting solutions.   Reviewed recent hospitalization for pneumothorax Larey Seat going to bathroom after fecal incontinence Follow up showed resolution of the pneumothorax but some effusion No SOB DIL worried about dehydration Last GFR 47  5 days ago--he was leaning on table at [employer]---leg stiffened and he couldn't move They were able to get him into a chair Lasted 1-2 minutes and he was awake--no post ictal period Some water after seemed to help  3 days ago--coming into the house with visiting neighbor He was carrying bucket of squash---fell onto both knees Right knee redness came up a day after  Dropped to the floor with DIL helping him down in shower--yesterday No sig hit on floor that time  DIL has tried to cut the furosemide-- to 80/60 No SOB Weight is down some Weight at home 170#  Current Outpatient Medications on File Prior to Visit  Medication Sig Dispense Refill   albuterol (VENTOLIN HFA) 108 (90 Base) MCG/ACT inhaler Inhale 2 puffs into the lungs every 4 (four) hours as needed for wheezing or shortness of breath. 8 g 0   allopurinol (ZYLOPRIM) 100 MG tablet Take 2 tablets (200 mg total) by mouth daily. 30 tablet 11   aspirin 81 MG EC tablet Take 81 mg by mouth daily.     Cholecalciferol (VITAMIN D) 50 MCG (2000 UT) tablet Take 2,000 Units by mouth daily.      colchicine 0.6 MG tablet TAKE 1 TABLET (0.6 MG TOTAL) BY MOUTH 2 (TWO) TIMES DAILY AS NEEDED. 60 tablet 0   Cyanocobalamin 2500 MCG TABS Take 2,500 mcg by mouth daily.      donepezil  (ARICEPT) 5 MG tablet Take 5 mg by mouth daily.      finasteride (PROSCAR) 5 MG tablet TAKE 1 TABLET BY MOUTH DAILY 90 tablet 3   furosemide (LASIX) 80 MG tablet Take 1 tablet (80 mg total) by mouth 2 (two) times daily. 180 tablet 1   melatonin 5 MG TABS Take 5 mg by mouth at bedtime.     polyethylene glycol (MIRALAX / GLYCOLAX) 17 g packet Take 17 g by mouth daily.     No current facility-administered medications on file prior to visit.    No Known Allergies  Past Medical History:  Diagnosis Date   Advance directive discussed with patient 07/15/2015   Atrial fibrillation (HCC) 01/23/2011   Benign prostatic hyperplasia (BPH) with urinary urgency 09/11/2007   Qualifier: Diagnosis of  By: Alphonsus Sias MD, Ronnette Hila    Dementia [facility])    Gout    Hyperlipidemia    Left inguinal hernia 03/29/2020   Mobitz type II atrioventricular block 01/25/2014   Pacemaker 04/27/2014   Pedal edema 09/10/2016   Routine general medical examination at a health care facility 07/01/2013   Scrotal right inuinal hernia 08/13/2019   Symptomatic bradycardia    s/p dual chamber PPM implantation April 2015 [facility])    Past Surgical History:  Procedure Laterality Date   CATARACT EXTRACTION     PACEMAKER INSERTION  01-25-2014   STJ Assurity dual chamber pacemaker  implanted by Dr [name] for symptomatic bradycardia   PERMANENT PACEMAKER INSERTION N/A 01/25/2014   Procedure: PERMANENT PACEMAKER INSERTION;  Surgeon: Marinus Maw, MD;  Location: [facility] C[facility] LAB;  Service: Cardiovascular;  Laterality: N/A;   TONSILLECTOMY      Family History  Problem Relation Age of Onset   Cancer Mother    Throat cancer Father     Social History   Socioeconomic History   Marital status: Widowed    Spouse name: Jmari Pelc   Number of children: 4   Years of education: Not on file   Highest education level: Not on file  Occupational History   Occupation: retired    Comment: [employer]  Tobacco Use   Smoking  status: Never   Smokeless tobacco: Never  Vaping Use   Vaping Use: Never used  Substance and Sexual Activity   Alcohol use: Yes    Comment: rare etoh use   Drug use: No   Sexual activity: Not on file  Other Topics Concern   Not on file  Social History Narrative   Widowed 4/15   No living will   Health care power of attorney is son [name]. Daughter [name] is financial POA    He feels he is ready for DNR---form done 08/13/19 (but he isn't sure he will put it up)   No tube feeds if cognitively unaware   Social Determinants of Health   Financial Resource Strain: Not on file  Food Insecurity: Not on file  Transportation Needs: Not on file  Physical Activity: Not on file  Stress: Not on file  Social Connections: Not on file  Intimate Partner Violence: Not on file   Review of Systems Some incontinence--fecal and urinary. Some worsening recently No chest pain Appetite is good Last GFR is 47    Objective:   Physical Exam Constitutional:      Appearance: Normal appearance.  Cardiovascular:     Rate and Rhythm: Normal rate and regular rhythm.     Heart sounds: No murmur heard.   No gallop.     Comments: Very distant heart sounds Musculoskeletal:     Cervical back: Neck supple.     Right lower leg: No edema.     Left lower leg: No edema.     Comments: Red, exquisitely tender area at lower right patella ROM fair--but with some pain  Lymphadenopathy:     Cervical: No cervical adenopathy.  Neurological:     Mental Status: He is alert.           Assessment & Plan:

## 2021-06-01 NOTE — Assessment & Plan Note (Signed)
Seems to be running dry now Weight down Gave weight parameters---only give second furosemide if home weight over 175# (down to 170 now)

## 2021-06-01 NOTE — Assessment & Plan Note (Signed)
Multiple risk factors Family tries to support him and monitor--but he still tries to do things independently

## 2021-06-01 NOTE — Assessment & Plan Note (Signed)
Will recheck labs now

## 2021-06-01 NOTE — Assessment & Plan Note (Signed)
No progression of functional decline

## 2021-06-02 LAB — CBC
HCT: 36.9 % — ABNORMAL LOW (ref 39.0–52.0)
Hemoglobin: 11.9 g/dL — ABNORMAL LOW (ref 13.0–17.0)
MCHC: 32.3 g/dL (ref 30.0–36.0)
MCV: 104.5 fl — ABNORMAL HIGH (ref 78.0–100.0)
Platelets: 139 10*3/uL — ABNORMAL LOW (ref 150.0–400.0)
RBC: 3.53 Mil/uL — ABNORMAL LOW (ref 4.22–5.81)
RDW: 17.7 % — ABNORMAL HIGH (ref 11.5–15.5)
WBC: 10.8 10*3/uL — ABNORMAL HIGH (ref 4.0–10.5)

## 2021-06-02 LAB — URIC ACID: Uric Acid, Serum: 6.1 mg/dL (ref 4.0–7.8)

## 2021-06-02 LAB — RENAL FUNCTION PANEL
Albumin: 4 g/dL (ref 3.5–5.2)
BUN: 33 mg/dL — ABNORMAL HIGH (ref 6–23)
CO2: 34 mEq/L — ABNORMAL HIGH (ref 19–32)
Calcium: 9.5 mg/dL (ref 8.4–10.5)
Chloride: 95 mEq/L — ABNORMAL LOW (ref 96–112)
Creatinine, Ser: 1.39 mg/dL (ref 0.40–1.50)
GFR: 41.79 mL/min — ABNORMAL LOW (ref >60.00)
Glucose, Bld: 97 mg/dL (ref 70–99)
Phosphorus: 4.1 mg/dL (ref 2.3–4.6)
Potassium: 4.2 mEq/L (ref 3.5–5.1)
Sodium: 137 mEq/L (ref 135–145)

## 2021-06-14 ENCOUNTER — Other Ambulatory Visit: Payer: Self-pay | Admitting: Internal Medicine

## 2021-07-02 ENCOUNTER — Other Ambulatory Visit: Payer: Self-pay

## 2021-07-02 ENCOUNTER — Emergency Department (HOSPITAL_COMMUNITY): Payer: Medicare Other

## 2021-07-02 ENCOUNTER — Inpatient Hospital Stay (HOSPITAL_COMMUNITY)
Admission: EM | Admit: 2021-07-02 | Discharge: 2021-07-06 | DRG: 178 | Disposition: A | Discharge status: Home / Self Care | Payer: Medicare Other | Attending: Internal Medicine | Admitting: Internal Medicine

## 2021-07-02 DIAGNOSIS — E785 Hyperlipidemia, unspecified: Secondary | ICD-10-CM | POA: Diagnosis present

## 2021-07-02 DIAGNOSIS — N401 Enlarged prostate with lower urinary tract symptoms: Secondary | ICD-10-CM | POA: Diagnosis present

## 2021-07-02 DIAGNOSIS — R3915 Urgency of urination: Secondary | ICD-10-CM | POA: Diagnosis present

## 2021-07-02 DIAGNOSIS — S0101XA Laceration without foreign body of scalp, initial encounter: Secondary | ICD-10-CM | POA: Diagnosis present

## 2021-07-02 DIAGNOSIS — R059 Cough, unspecified: Secondary | ICD-10-CM

## 2021-07-02 DIAGNOSIS — R0602 Shortness of breath: Secondary | ICD-10-CM

## 2021-07-02 DIAGNOSIS — R296 Repeated falls: Secondary | ICD-10-CM | POA: Diagnosis present

## 2021-07-02 DIAGNOSIS — J40 Bronchitis, not specified as acute or chronic: Secondary | ICD-10-CM | POA: Diagnosis present

## 2021-07-02 DIAGNOSIS — Z7982 Long term (current) use of aspirin: Secondary | ICD-10-CM

## 2021-07-02 DIAGNOSIS — Y92009 Unspecified place in unspecified non-institutional (private) residence as the place of occurrence of the external cause: Secondary | ICD-10-CM

## 2021-07-02 DIAGNOSIS — I5032 Chronic diastolic (congestive) heart failure: Secondary | ICD-10-CM | POA: Diagnosis present

## 2021-07-02 DIAGNOSIS — R778 Other specified abnormalities of plasma proteins: Secondary | ICD-10-CM | POA: Diagnosis present

## 2021-07-02 DIAGNOSIS — N1831 Chronic kidney disease, stage 3a: Secondary | ICD-10-CM | POA: Diagnosis present

## 2021-07-02 DIAGNOSIS — F0150 Vascular dementia without behavioral disturbance: Secondary | ICD-10-CM | POA: Diagnosis present

## 2021-07-02 DIAGNOSIS — M109 Gout, unspecified: Secondary | ICD-10-CM | POA: Diagnosis present

## 2021-07-02 DIAGNOSIS — U071 COVID-19: Principal | ICD-10-CM | POA: Diagnosis present

## 2021-07-02 DIAGNOSIS — Z91048 Other nonmedicinal substance allergy status: Secondary | ICD-10-CM

## 2021-07-02 DIAGNOSIS — Z79899 Other long term (current) drug therapy: Secondary | ICD-10-CM

## 2021-07-02 DIAGNOSIS — I248 Other forms of acute ischemic heart disease: Secondary | ICD-10-CM | POA: Diagnosis present

## 2021-07-02 DIAGNOSIS — Z950 Presence of cardiac pacemaker: Secondary | ICD-10-CM

## 2021-07-02 DIAGNOSIS — W19XXXA Unspecified fall, initial encounter: Secondary | ICD-10-CM

## 2021-07-02 DIAGNOSIS — I4891 Unspecified atrial fibrillation: Secondary | ICD-10-CM | POA: Diagnosis present

## 2021-07-02 DIAGNOSIS — W07XXXA Fall from chair, initial encounter: Secondary | ICD-10-CM | POA: Diagnosis present

## 2021-07-02 LAB — URINALYSIS, ROUTINE W REFLEX MICROSCOPIC
Bilirubin Urine: NEGATIVE
Glucose, UA: NEGATIVE mg/dL
Hgb urine dipstick: NEGATIVE
Ketones, ur: NEGATIVE mg/dL
Leukocytes,Ua: NEGATIVE
Nitrite: NEGATIVE
Protein, ur: NEGATIVE mg/dL
Specific Gravity, Urine: 1.016 (ref 1.005–1.030)
pH: 5 (ref 5.0–8.0)

## 2021-07-02 LAB — C-REACTIVE PROTEIN: CRP: 3.8 mg/dL — ABNORMAL HIGH (ref <1.0)

## 2021-07-02 LAB — COMPREHENSIVE METABOLIC PANEL
ALT: 12 U/L (ref 0–44)
AST: 22 U/L (ref 15–41)
Albumin: 3.6 g/dL (ref 3.5–5.0)
Alkaline Phosphatase: 64 U/L (ref 38–126)
Anion gap: 12 (ref 5–15)
BUN: 29 mg/dL — ABNORMAL HIGH (ref 8–23)
CO2: 29 mmol/L (ref 22–32)
Calcium: 9 mg/dL (ref 8.9–10.3)
Chloride: 99 mmol/L (ref 98–111)
Creatinine, Ser: 1.32 mg/dL — ABNORMAL HIGH (ref 0.61–1.24)
GFR, Estimated: 48 mL/min — ABNORMAL LOW (ref >60)
Glucose, Bld: 155 mg/dL — ABNORMAL HIGH (ref 70–99)
Potassium: 4.2 mmol/L (ref 3.5–5.1)
Sodium: 140 mmol/L (ref 135–145)
Total Bilirubin: 0.8 mg/dL (ref 0.3–1.2)
Total Protein: 7 g/dL (ref 6.5–8.1)

## 2021-07-02 LAB — CBC WITH DIFFERENTIAL/PLATELET
Abs Immature Granulocytes: 0.04 10*3/uL (ref 0.00–0.07)
Basophils Absolute: 0 10*3/uL (ref 0.0–0.1)
Basophils Relative: 0 %
Eosinophils Absolute: 0 10*3/uL (ref 0.0–0.5)
Eosinophils Relative: 0 %
HCT: 37.2 % — ABNORMAL LOW (ref 39.0–52.0)
Hemoglobin: 11.4 g/dL — ABNORMAL LOW (ref 13.0–17.0)
Immature Granulocytes: 0 %
Lymphocytes Relative: 9 %
Lymphs Abs: 1 10*3/uL (ref 0.7–4.0)
MCH: 32.8 pg (ref 26.0–34.0)
MCHC: 30.6 g/dL (ref 30.0–36.0)
MCV: 106.9 fL — ABNORMAL HIGH (ref 80.0–100.0)
Monocytes Absolute: 1.5 10*3/uL — ABNORMAL HIGH (ref 0.1–1.0)
Monocytes Relative: 13 %
Neutro Abs: 9 10*3/uL — ABNORMAL HIGH (ref 1.7–7.7)
Neutrophils Relative %: 78 %
Platelets: 148 10*3/uL — ABNORMAL LOW (ref 150–400)
RBC: 3.48 MIL/uL — ABNORMAL LOW (ref 4.22–5.81)
RDW: 16.1 % — ABNORMAL HIGH (ref 11.5–15.5)
WBC: 11.6 10*3/uL — ABNORMAL HIGH (ref 4.0–10.5)
nRBC: 0 % (ref 0.0–0.2)

## 2021-07-02 LAB — RESP PANEL BY RT-PCR (FLU A&B, COVID) ARPGX2
Influenza A by PCR: NEGATIVE
Influenza B by PCR: NEGATIVE
SARS Coronavirus 2 by RT PCR: POSITIVE — AB

## 2021-07-02 LAB — TROPONIN I (HIGH SENSITIVITY)
Troponin I (High Sensitivity): 47 ng/L — ABNORMAL HIGH (ref <18)
Troponin I (High Sensitivity): 61 ng/L — ABNORMAL HIGH (ref <18)
Troponin I (High Sensitivity): 79 ng/L — ABNORMAL HIGH (ref <18)

## 2021-07-02 LAB — PROCALCITONIN: Procalcitonin: <0.1 ng/mL

## 2021-07-02 LAB — D-DIMER, QUANTITATIVE: D-Dimer, Quant: 3.83 ug/mL-FEU — ABNORMAL HIGH (ref 0.00–0.50)

## 2021-07-02 LAB — LIPASE, BLOOD: Lipase: 42 U/L (ref 11–51)

## 2021-07-02 LAB — LACTATE DEHYDROGENASE: LDH: 183 U/L (ref 98–192)

## 2021-07-02 LAB — FERRITIN: Ferritin: 247 ng/mL (ref 24–336)

## 2021-07-02 LAB — FIBRINOGEN: Fibrinogen: 494 mg/dL — ABNORMAL HIGH (ref 210–475)

## 2021-07-02 MED ORDER — HYDROCOD POLST-CPM POLST ER 10-8 MG/5ML PO SUER: 5.0000 mL | Freq: Two times a day (BID) | ORAL | Status: DC | PRN

## 2021-07-02 MED ORDER — POLYETHYLENE GLYCOL 3350 17 G PO PACK: 17.0000 g | PACK | Freq: Every day | ORAL | Status: DC | PRN

## 2021-07-02 MED ORDER — ONDANSETRON HCL 4 MG/2ML IJ SOLN
INTRAMUSCULAR | Status: AC
  Filled 2021-07-02: qty 2

## 2021-07-02 MED ORDER — DONEPEZIL HCL 5 MG PO TABS: 5.0000 mg | ORAL_TABLET | Freq: Every day | ORAL | Status: DC

## 2021-07-02 MED ORDER — SODIUM CHLORIDE 0.9% FLUSH
3.0000 mL | Freq: Two times a day (BID) | INTRAVENOUS | Status: DC
  Administered 2021-07-02: 3 mL via INTRAVENOUS

## 2021-07-02 MED ORDER — ASCORBIC ACID 500 MG PO TABS
500.0000 mg | ORAL_TABLET | Freq: Every day | ORAL | Status: DC
  Administered 2021-07-02 – 2021-07-06 (×5): 500 mg via ORAL
  Filled 2021-07-02 (×6): qty 1

## 2021-07-02 MED ORDER — ZINC SULFATE 220 (50 ZN) MG PO CAPS
220.0000 mg | ORAL_CAPSULE | Freq: Every day | ORAL | Status: DC
  Administered 2021-07-02 – 2021-07-06 (×5): 220 mg via ORAL
  Filled 2021-07-02 (×5): qty 1

## 2021-07-02 MED ORDER — SODIUM CHLORIDE 0.9% FLUSH
3.0000 mL | Freq: Two times a day (BID) | INTRAVENOUS | Status: DC
  Administered 2021-07-02 – 2021-07-06 (×8): 3 mL via INTRAVENOUS

## 2021-07-02 MED ORDER — ONDANSETRON HCL 4 MG/2ML IJ SOLN
4.0000 mg | Freq: Four times a day (QID) | INTRAMUSCULAR | Status: DC | PRN
  Administered 2021-07-05: 4 mg via INTRAVENOUS
  Filled 2021-07-02: qty 2

## 2021-07-02 MED ORDER — DONEPEZIL HCL 5 MG PO TABS
5.0000 mg | ORAL_TABLET | Freq: Every day | ORAL | Status: DC
  Administered 2021-07-02 – 2021-07-06 (×5): 5 mg via ORAL
  Filled 2021-07-02 (×5): qty 1

## 2021-07-02 MED ORDER — POLYETHYLENE GLYCOL 3350 17 G PO PACK
17.0000 g | PACK | Freq: Every day | ORAL | Status: DC
  Administered 2021-07-03 – 2021-07-05 (×3): 17 g via ORAL
  Filled 2021-07-02 (×5): qty 1

## 2021-07-02 MED ORDER — GUAIFENESIN-DM 100-10 MG/5ML PO SYRP
10.0000 mL | ORAL_SOLUTION | ORAL | Status: DC | PRN
  Filled 2021-07-02 (×2): qty 10

## 2021-07-02 MED ORDER — ASPIRIN 81 MG PO CHEW
324.0000 mg | CHEWABLE_TABLET | Freq: Once | ORAL | Status: AC
  Administered 2021-07-02: 324 mg via ORAL
  Filled 2021-07-02: qty 4

## 2021-07-02 MED ORDER — OXYCODONE HCL 5 MG PO TABS: 5.0000 mg | ORAL_TABLET | ORAL | Status: DC | PRN

## 2021-07-02 MED ORDER — ALBUTEROL SULFATE HFA 108 (90 BASE) MCG/ACT IN AERS
2.0000 | INHALATION_SPRAY | RESPIRATORY_TRACT | Status: DC | PRN
  Filled 2021-07-02: qty 6.7

## 2021-07-02 MED ORDER — HALOPERIDOL LACTATE 5 MG/ML IJ SOLN: 2.0000 mg | Freq: Four times a day (QID) | INTRAMUSCULAR | Status: DC | PRN

## 2021-07-02 MED ORDER — BISACODYL 5 MG PO TBEC
5.0000 mg | DELAYED_RELEASE_TABLET | Freq: Every day | ORAL | Status: DC | PRN
  Administered 2021-07-05: 5 mg via ORAL
  Filled 2021-07-02: qty 1

## 2021-07-02 MED ORDER — FLEET ENEMA 7-19 GM/118ML RE ENEM: 1.0000 | ENEMA | Freq: Once | RECTAL | Status: DC | PRN

## 2021-07-02 MED ORDER — ENOXAPARIN SODIUM 40 MG/0.4ML IJ SOSY
40.0000 mg | PREFILLED_SYRINGE | INTRAMUSCULAR | Status: DC
  Administered 2021-07-02 – 2021-07-03 (×2): 40 mg via SUBCUTANEOUS
  Filled 2021-07-02 (×2): qty 0.4

## 2021-07-02 MED ORDER — ALLOPURINOL 100 MG PO TABS
200.0000 mg | ORAL_TABLET | Freq: Every day | ORAL | Status: DC
  Administered 2021-07-02 – 2021-07-06 (×5): 200 mg via ORAL
  Filled 2021-07-02 (×5): qty 2

## 2021-07-02 MED ORDER — ACETAMINOPHEN 325 MG PO TABS: 650.0000 mg | ORAL_TABLET | Freq: Four times a day (QID) | ORAL | Status: DC | PRN

## 2021-07-02 MED ORDER — DOCUSATE SODIUM 100 MG PO CAPS
100.0000 mg | ORAL_CAPSULE | Freq: Two times a day (BID) | ORAL | Status: DC
  Administered 2021-07-02 – 2021-07-06 (×9): 100 mg via ORAL
  Filled 2021-07-02 (×9): qty 1

## 2021-07-02 MED ORDER — SODIUM CHLORIDE 0.9 % IV SOLN
200.0000 mg | Freq: Once | INTRAVENOUS | Status: AC
  Administered 2021-07-02: 200 mg via INTRAVENOUS
  Filled 2021-07-02: qty 40

## 2021-07-02 MED ORDER — ASPIRIN EC 81 MG PO TBEC
81.0000 mg | DELAYED_RELEASE_TABLET | Freq: Every day | ORAL | Status: DC
  Administered 2021-07-03 – 2021-07-06 (×4): 81 mg via ORAL
  Filled 2021-07-02 (×4): qty 1

## 2021-07-02 MED ORDER — ONDANSETRON HCL 4 MG/2ML IJ SOLN
4.0000 mg | Freq: Once | INTRAMUSCULAR | Status: AC | PRN
  Administered 2021-07-02: 4 mg via INTRAVENOUS

## 2021-07-02 MED ORDER — MELATONIN 5 MG PO TABS
5.0000 mg | ORAL_TABLET | Freq: Every day | ORAL | Status: DC
  Administered 2021-07-02 – 2021-07-05 (×4): 5 mg via ORAL
  Filled 2021-07-02 (×5): qty 1

## 2021-07-02 MED ORDER — FINASTERIDE 5 MG PO TABS
5.0000 mg | ORAL_TABLET | Freq: Every day | ORAL | Status: DC
  Administered 2021-07-02 – 2021-07-06 (×5): 5 mg via ORAL
  Filled 2021-07-02 (×5): qty 1

## 2021-07-02 MED ORDER — ONDANSETRON HCL 4 MG PO TABS: 4.0000 mg | ORAL_TABLET | Freq: Four times a day (QID) | ORAL | Status: DC | PRN

## 2021-07-02 MED ORDER — SODIUM CHLORIDE 0.9 % IV SOLN
100.0000 mg | Freq: Every day | INTRAVENOUS | Status: DC
  Administered 2021-07-03 – 2021-07-05 (×3): 100 mg via INTRAVENOUS
  Filled 2021-07-02: qty 100
  Filled 2021-07-02 (×2): qty 20

## 2021-07-02 MED ORDER — SODIUM CHLORIDE 0.9% FLUSH
3.0000 mL | INTRAVENOUS | Status: DC | PRN
  Administered 2021-07-02: 3 mL via INTRAVENOUS

## 2021-07-02 MED ORDER — SODIUM CHLORIDE 0.9 % IV SOLN: 250.0000 mL | INTRAVENOUS | Status: DC | PRN

## 2021-07-02 NOTE — ED Triage Notes (Signed)
Pt in from home, for fall. Pt was in chair, per family, and chair fell over and patient fell and hit head on furniture. No LOC, but does take aspirin. Pt family stating that patient vomited 4 times and has been appearing off balance since the fall. Pt presents with minor laceration to posterior head. No external bleeding at this time.

## 2021-07-02 NOTE — ED Notes (Signed)
Pt vomiting, zofran given

## 2021-07-02 NOTE — ED Provider Notes (Signed)
[facility] EMERGENCY DEPARTMENT Provider Note   CSN: [id number] Arrival date & time: 07/02/21  0003     History Chief Complaint  Patient presents with   Matthew Howard is a 87 y.o. male.  The history is provided by the patient and a relative.  Fall Matthew Howard is a 87 y.o. male who presents to the Emergency Department complaining of fall.  Hx is provided by the patient's daughter.  He has had runny nose and cough for two days.  Today he went to sit down and missed the chair and fell backwards, striking his head on a desk.  No LOC.  He has experienced emesis since hitting his head.  He lives at home with daughter and son in Social [place].  Pt denies any current pain.  He did have a fall three weeks ago and had a ptx that was treated with observation.       Past Medical History:  Diagnosis Date   Advance directive discussed with patient 07/15/2015   Atrial fibrillation (HCC) 01/23/2011   Benign prostatic hyperplasia (BPH) with urinary urgency 09/11/2007   Qualifier: Diagnosis of  By: Alphonsus Sias MD, Ronnette Hila    Dementia [facility])    Gout    Hyperlipidemia    Left inguinal hernia 03/29/2020   Mobitz type II atrioventricular block 01/25/2014   Pacemaker 04/27/2014   Pedal edema 09/10/2016   Routine general medical examination at a health care facility 07/01/2013   Scrotal right inuinal hernia 08/13/2019   Symptomatic bradycardia    s/p dual chamber PPM implantation April 2015 [facility])    Patient Active Problem List   Diagnosis Date Noted   Falls frequently 06/01/2021   Stage 3a chronic kidney disease (HCC) 06/01/2021   Pneumothorax 05/05/2021   Gout of left knee 10/13/2020   Left leg pain 09/12/2020   Vascular dementia, uncomplicated (HCC) 06/21/2020   Chronic diastolic heart failure (HCC) 06/21/2020   Urinary frequency 06/15/2020   Fatigue 06/07/2020   Symptomatic bradycardia 05/30/2020   Left inguinal hernia 03/29/2020   Scrotal right  inuinal hernia 08/13/2019   Pedal edema 09/10/2016   Advance directive discussed with patient 07/15/2015   Pacemaker 04/27/2014   Mobitz type II atrioventricular block 01/25/2014   Routine general medical examination at a health care facility 07/01/2013   Atrial fibrillation (HCC) 01/23/2011   Gout 09/11/2007   Benign prostatic hyperplasia (BPH) with urinary urgency 09/11/2007    Past Surgical History:  Procedure Laterality Date   CATARACT EXTRACTION     PACEMAKER INSERTION  01-25-2014   STJ Assurity dual chamber pacemaker implanted by Dr [name] for symptomatic bradycardia   PERMANENT PACEMAKER INSERTION N/A 01/25/2014   Procedure: PERMANENT PACEMAKER INSERTION;  Surgeon: Marinus Maw, MD;  Location: [facility] CATH LAB;  Service: Cardiovascular;  Laterality: N/A;   TONSILLECTOMY         Family History  Problem Relation Age of Onset   Cancer Mother    Throat cancer Father     Social History   Tobacco Use   Smoking status: Never   Smokeless tobacco: Never  Vaping Use   Vaping Use: Never used  Substance Use Topics   Alcohol use: Yes    Comment: rare etoh use   Drug use: No    Home Medications Prior to Admission medications   Medication Sig Start Date End Date Taking? Authorizing Provider  albuterol (VENTOLIN HFA) 108 (90 Base) MCG/ACT inhaler Inhale 2 puffs  into the lungs every 4 (four) hours as needed for wheezing or shortness of breath. 05/06/21   Glade Lloyd, MD  allopurinol (ZYLOPRIM) 100 MG tablet TAKE 2 TABLETS BY MOUTH EVERY DAY 06/14/21   Tillman Abide I, MD  aspirin 81 MG EC tablet Take 81 mg by mouth daily.    [provider]  Cholecalciferol (VITAMIN D) 50 MCG (2000 UT) tablet Take 2,000 Units by mouth daily.     [provider]  colchicine 0.6 MG tablet TAKE 1 TABLET (0.6 MG TOTAL) BY MOUTH 2 (TWO) TIMES DAILY AS NEEDED. 10/29/20   [name], MD  Cyanocobalamin 2500 MCG TABS Take 2,500 mcg by mouth daily.     [provider]   donepezil (ARICEPT) 5 MG tablet Take 5 mg by mouth daily.     [provider]  finasteride (PROSCAR) 5 MG tablet TAKE 1 TABLET BY MOUTH DAILY 09/12/16   [name], MD  furosemide (LASIX) 80 MG tablet Take 1 tablet (80 mg total) by mouth 2 (two) times daily. 05/04/21   Corky Crafts, MD  melatonin 5 MG TABS Take 5 mg by mouth at bedtime.    [provider]  polyethylene glycol (MIRALAX / GLYCOLAX) 17 g packet Take 17 g by mouth daily.    [provider]  predniSONE (DELTASONE) 20 MG tablet Take 2 tablets (40 mg total) by mouth daily. For 3 days, then 1 daily for 3 days 06/01/21   [name], MD    Allergies    Patient has no known allergies.  Review of Systems   Review of Systems  All other systems reviewed and are negative.  Physical Exam Updated Vital Signs BP (!) 114/55   Pulse 70   Temp 97.6 F (36.4 C) (Oral)   Resp (!) 25   Ht 5\' 11"  (1.803 m)   Wt 77.1 kg   SpO2 96%   BMI 23.71 kg/m   Physical Exam Vitals and nursing note reviewed.  Constitutional:      Appearance: He is well-developed.  HENT:     Head: Normocephalic.     Comments: Abrasion to posterior scalp.  Cardiovascular:     Rate and Rhythm: Normal rate and regular rhythm.     Heart sounds: No murmur heard. Pulmonary:     Effort: Pulmonary effort is normal. No respiratory distress.     Breath sounds: Normal breath sounds.  Abdominal:     Palpations: Abdomen is soft.     Tenderness: There is no abdominal tenderness. There is no guarding or rebound.  Musculoskeletal:        General: No tenderness.     Comments: Erythema over thoracic spine without local tenderness.  Abrasion over right posterior shoulder without tenderness  Skin:    General: Skin is warm and dry.  Neurological:     Mental Status: He is alert and oriented to person, place, and time.     Comments: 5/5 strength in all four extremities.    Psychiatric:        Behavior: Behavior normal.    ED  Results / Procedures / Treatments   Labs (all labs ordered are listed, but only abnormal results are displayed) Labs Reviewed  RESP PANEL BY RT-PCR (FLU A&B, COVID) ARPGX2 - Abnormal; Notable for the following components:      Result Value   SARS Coronavirus 2 by RT PCR POSITIVE (*)    All other components within normal limits  CBC WITH  DIFFERENTIAL/PLATELET - Abnormal; Notable for the following components:   WBC 11.6 (*)    RBC 3.48 (*)    Hemoglobin 11.4 (*)    HCT 37.2 (*)    MCV 106.9 (*)    RDW 16.1 (*)    Platelets 148 (*)    Neutro Abs 9.0 (*)    Monocytes Absolute 1.5 (*)    All other components within normal limits  COMPREHENSIVE METABOLIC PANEL - Abnormal; Notable for the following components:   Glucose, Bld 155 (*)    BUN 29 (*)    Creatinine, Ser 1.32 (*)    GFR, Estimated 48 (*)    All other components within normal limits  TROPONIN I (HIGH SENSITIVITY) - Abnormal; Notable for the following components:   Troponin I (High Sensitivity) 47 (*)    All other components within normal limits  TROPONIN I (HIGH SENSITIVITY) - Abnormal; Notable for the following components:   Troponin I (High Sensitivity) 61 (*)    All other components within normal limits  TROPONIN I (HIGH SENSITIVITY) - Abnormal; Notable for the following components:   Troponin I (High Sensitivity) 79 (*)    All other components within normal limits  URINALYSIS, ROUTINE W REFLEX MICROSCOPIC  LIPASE, BLOOD    EKG EKG Interpretation  Date/Time:  Sunday July 02 2021 00:25:22 EDT Ventricular Rate:  70 PR Interval:    QRS Duration: 154 QT Interval:  460 QTC Calculation: 496 R Axis:   -69 Text Interpretation: Ventricular-paced rhythm Abnormal ECG Confirmed by Tilden Fossa [phone]) on 07/02/2021 4:09:00 AM  Radiology CT HEAD WO CONTRAST ( )  Result Date: 07/02/2021 CLINICAL DATA:  Head trauma, mod-severe 87 year old post fall EXAM: CT HEAD WITHOUT CONTRAST TECHNIQUE: Contiguous axial images  were obtained from the base of the skull through the vertex without intravenous contrast. COMPARISON:  Head CT 07/14/2020 FINDINGS: Brain: Stable atrophy and chronic small vessel ischemia. No intracranial hemorrhage, mass effect, or midline shift. No hydrocephalus. The basilar cisterns are patent. No evidence of territorial infarct or acute ischemia. No extra-axial or intracranial fluid collection. Vascular: Atherosclerosis of skullbase vasculature without hyperdense vessel or abnormal calcification. Skull: No fracture or focal lesion. Sinuses/Orbits: Paranasal sinuses and mastoid air cells are clear. The visualized orbits are unremarkable. Bilateral cataract resection. Other: None. IMPRESSION: 1. No acute intracranial abnormality. No skull fracture. 2. Stable atrophy and chronic small vessel ischemia. Electronically Signed   By: Narda Rutherford M.D.   On: 07/02/2021 01:11   CT Cervical Spine Wo Contrast  Result Date: 07/02/2021 CLINICAL DATA:  87 year old post fall. Neck trauma (Age >= 65y) EXAM: CT CERVICAL SPINE WITHOUT CONTRAST TECHNIQUE: Multidetector CT imaging of the cervical spine was performed without intravenous contrast. Multiplanar CT image reconstructions were also generated. COMPARISON:  None. FINDINGS: Alignment: Normal. Skull base and vertebrae: No acute fracture. Vertebral body heights are maintained. The dens and skull base are intact. Soft tissues and spinal canal: No prevertebral fluid or swelling. No visible canal hematoma. Disc levels: Degenerative disc disease is most prominent at C6-C7, to a lesser extent C5-C6 and C7-T1. Mild scattered facet hypertrophy. Upper chest: No acute or unexpected findings Other: None. IMPRESSION: Degenerative change in the cervical spine without acute fracture or subluxation. Electronically Signed   By: Narda Rutherford M.D.   On: 07/02/2021 01:14   DG Chest Port 1 View  Result Date: 07/02/2021 CLINICAL DATA:  87 year old post fall. EXAM: PORTABLE CHEST 1  VIEW COMPARISON:  05/17/2021 FINDINGS: Left-sided pacemaker in place. The previous left pneumothorax  is no longer seen. Decreased left pleural effusion from prior exam. Trace residual blunting of the costophrenic angle. No new airspace disease. No pulmonary edema. Stable heart size and mediastinal contours. No acute osseous abnormalities are seen. IMPRESSION: 1. No acute chest finding. Previous left pneumothorax is no longer seen. 2. Decreased left pleural effusion from prior exam. Electronically Signed   By: Narda Rutherford M.D.   On: 07/02/2021 01:06    Procedures Procedures   Medications Ordered in ED Medications  aspirin chewable tablet 324 mg (has no administration in time range)  ondansetron (ZOFRAN) injection 4 mg (4 mg Intravenous Given 07/02/21 0112)    ED Course  I have reviewed the triage vital signs and the nursing notes.  Pertinent labs & imaging results that were available during my care of the patient were reviewed by me and considered in my medical decision making (see chart for details).    MDM Rules/Calculators/A&P                          patient with history of dementia, pacemaker placement here for evaluation following a fall. He has had upper respiratory symptoms for the last few days. On evaluation he is non-toxic appearing. He does have erythema and abrasions to his back, no local tenderness. He is positive today for COVID-19. CBC with mild leukocytosis. No evidence of acute bacterial infection. His troponin is mildly elevated on initial labs. Repeat draws with slow rise in troponin. Given persistent elevation in troponin, COVID-19 infection recommend admission for observation. Medicine consulted for admission.  Final Clinical Impression(s) / ED Diagnoses Final diagnoses:  Fall, initial encounter  COVID-19 virus infection  Elevated troponin    Rx / DC Orders ED Discharge Orders     None        Tilden Fossa, MD 07/02/21 [phone]

## 2021-07-02 NOTE — H&P (Addendum)
History and Physical    Matthew Howard YOV:[id number] DOB: 04/24/1919 DOA: 07/02/2021  PCP: Matthew Schwalbe, MD Consultants:  Matthew Howard - cardiology; Matthew Howard - neurology Patient coming from:  Home - lives with son and wife; NOK: Matthew Howard, Matthew Howard [phone]; [phone]  Chief Complaint: Matthew Howard  HPI: Matthew Howard is a 87 y.o. male with medical history significant of afib; BPH; HLD; dementia; and pacemaker placement presenting with a fall.  His DIL reports that he has had URI symptoms the last couple of days - rhinorrhea and cough.  He received his COVID booster 10 days ago.  He was going to the table yesterday and sat down hard and fell backwards in his chair, striking the back of his head.   ED Course: Larey Seat at home, URI symptoms at home.  Hit head on desk, has abrasion but no serious illness.  +COVID, mildly elevated troponin, 47, 61, 79.  +cough, no CP, not overly SOB.  Review of Systems: Unable to effectively perform   Ambulatory Status:  Ambulates with a cane   COVID Vaccine Status:  Complete plus 2 boosters  Past Medical History:  Diagnosis Date   Advance directive discussed with patient 07/15/2015   Atrial fibrillation (HCC) 01/23/2011   Benign prostatic hyperplasia (BPH) with urinary urgency 09/11/2007   Qualifier: Diagnosis of  By: Alphonsus Sias MD, Ronnette Hila    Dementia [facility])    Gout    Hyperlipidemia    Left inguinal hernia 03/29/2020   Mobitz type II atrioventricular block 01/25/2014   Pacemaker 04/27/2014   Pedal edema 09/10/2016   Routine general medical examination at a health care facility 07/01/2013   Scrotal right inuinal hernia 08/13/2019   Symptomatic bradycardia    s/p dual chamber PPM implantation April 2015 [facility])    Past Surgical History:  Procedure Laterality Date   CATARACT EXTRACTION     PACEMAKER INSERTION  01-25-2014   STJ Assurity dual chamber pacemaker implanted by Dr [name] for symptomatic bradycardia   PERMANENT PACEMAKER  INSERTION N/A 01/25/2014   Procedure: PERMANENT PACEMAKER INSERTION;  Surgeon: Marinus Maw, MD;  Location: [facility] C[facility];  Service: Cardiovascular;  Laterality: N/A;   TONSILLECTOMY      Social History   Socioeconomic History   Marital status: Widowed    Spouse name: Matthew Howard   Number of children: 4   Years of education: Not on file   Highest education level: Not on file  Occupational History   Occupation: retired    Comment: [employer]  Tobacco Use   Smoking status: Never   Smokeless tobacco: Never  Vaping Use   Vaping Use: Never used  Substance and Sexual Activity   Alcohol use: Yes    Comment: rare etoh use   Drug use: No   Sexual activity: Not on file  Other Topics Concern   Not on file  Social History Narrative   Widowed 4/15   No living will   Health care power of attorney is son Matthew Howard. Daughter Matthew Howard is financial POA    He feels he is ready for DNR---form done 08/13/19 (but he isn't sure he will put it up)   No tube feeds if cognitively unaware   Social Determinants of Health   Financial Resource Strain: Not on file  Food Insecurity: Not on file  Transportation Needs: Not on file  Physical Activity: Not on file  Stress: Not on file  Social Connections: Not on file  Intimate Partner Violence: Not on  file    No Known Allergies  Family History  Problem Relation Age of Onset   Cancer Mother    Throat cancer Father     Prior to Admission medications   Medication Sig Start Date End Date Taking? Authorizing Provider  albuterol (VENTOLIN HFA) 108 (90 Base) MCG/ACT inhaler Inhale 2 puffs into the lungs every 4 (four) hours as needed for wheezing or shortness of breath. 05/06/21   Glade Lloyd, MD  allopurinol (ZYLOPRIM) 100 MG tablet TAKE 2 TABLETS BY MOUTH EVERY DAY 06/14/21   Tillman Abide I, MD  aspirin 81 MG EC tablet Take 81 mg by mouth daily.    [provider]  Cholecalciferol (VITAMIN D) 50 MCG (2000 UT) tablet Take 2,000 Units by mouth  daily.     [provider]  colchicine 0.6 MG tablet TAKE 1 TABLET (0.6 MG TOTAL) BY MOUTH 2 (TWO) TIMES DAILY AS NEEDED. 10/29/20   Matthew Schwalbe, MD  Cyanocobalamin 2500 MCG TABS Take 2,500 mcg by mouth daily.     [provider]  donepezil (ARICEPT) 5 MG tablet Take 5 mg by mouth daily.     [provider]  finasteride (PROSCAR) 5 MG tablet TAKE 1 TABLET BY MOUTH DAILY 09/12/16   Matthew Schwalbe, MD  furosemide (LASIX) 80 MG tablet Take 1 tablet (80 mg total) by mouth 2 (two) times daily. 05/04/21   Corky Crafts, MD  melatonin 5 MG TABS Take 5 mg by mouth at bedtime.    [provider]  polyethylene glycol (MIRALAX / GLYCOLAX) 17 g packet Take 17 g by mouth daily.    [provider]  predniSONE (DELTASONE) 20 MG tablet Take 2 tablets (40 mg total) by mouth daily. For 3 days, then 1 daily for 3 days 06/01/21   Matthew Schwalbe, MD    Physical Exam: Vitals:   07/02/21 0630 07/02/21 0700 07/02/21 0730 07/02/21 0800  BP: (!) 120/50 (!) 114/55 (!) 122/53 (!) 124/55  Pulse: 69 70 70 67  Resp: (!) 23 (!) 25 19 (!) 25  Temp:      TempSrc:      SpO2: 96% 96% 98% 96%  Weight:      Height:         General:  Appears calm and comfortable and is in NAD but somnolent; 1 cm superficial laceration on the crown of his head Eyes:   EOMI, normal lids, iris ENT:  hard of hearing, lips & tongue, mmm Neck:  no LAD, masses or thyromegaly Cardiovascular:  RRR, no m/r/g. No LE edema.  Respiratory:   Scattered rhonchi.  Mildly increased respiratory effort. Abdomen:  soft, NT, ND Skin:  no rash or induration seen on limited exam Musculoskeletal:   no bony abnormality Psychiatric:  somnolent mood and affect, speech sparse but appropriate Neurologic:  unable to effectively perform    Radiological Exams on Admission: Independently reviewed - see discussion in A/P where applicable  CT HEAD WO CONTRAST ( )  Result Date: 07/02/2021 CLINICAL DATA:   Head trauma, mod-severe 87 year old post fall EXAM: CT HEAD WITHOUT CONTRAST TECHNIQUE: Contiguous axial images were obtained from the base of the skull through the vertex without intravenous contrast. COMPARISON:  Head CT 07/14/2020 FINDINGS: Brain: Howard atrophy and chronic small vessel ischemia. No intracranial hemorrhage, mass effect, or midline shift. No hydrocephalus. The basilar cisterns are patent. No evidence of territorial infarct or acute ischemia. No extra-axial or intracranial fluid collection. Vascular: Atherosclerosis of skullbase vasculature without  hyperdense vessel or abnormal calcification. Skull: No fracture or focal lesion. Sinuses/Orbits: Paranasal sinuses and mastoid air cells are clear. The visualized orbits are unremarkable. Bilateral cataract resection. Other: None. IMPRESSION: 1. No acute intracranial abnormality. No skull fracture. 2. Howard atrophy and chronic small vessel ischemia. Electronically Signed   By: Narda Rutherford M.D.   On: 07/02/2021 01:11   CT Cervical Spine Wo Contrast  Result Date: 07/02/2021 CLINICAL DATA:  87 year old post fall. Neck trauma (Age >= 65y) EXAM: CT CERVICAL SPINE WITHOUT CONTRAST TECHNIQUE: Multidetector CT imaging of the cervical spine was performed without intravenous contrast. Multiplanar CT image reconstructions were also generated. COMPARISON:  None. FINDINGS: Alignment: Normal. Skull base and vertebrae: No acute fracture. Vertebral body heights are maintained. The dens and skull base are intact. Soft tissues and spinal canal: No prevertebral fluid or swelling. No visible canal hematoma. Disc levels: Degenerative disc disease is most prominent at C6-C7, to a lesser extent C5-C6 and C7-T1. Mild scattered facet hypertrophy. Upper chest: No acute or unexpected findings Other: None. IMPRESSION: Degenerative change in the cervical spine without acute fracture or subluxation. Electronically Signed   By: Narda Rutherford M.D.   On: 07/02/2021 01:14    DG Chest Port 1 View  Result Date: 07/02/2021 CLINICAL DATA:  87 year old post fall. EXAM: PORTABLE CHEST 1 VIEW COMPARISON:  05/17/2021 FINDINGS: Left-sided pacemaker in place. The previous left pneumothorax is no longer seen. Decreased left pleural effusion from prior exam. Trace residual blunting of the costophrenic angle. No new airspace disease. No pulmonary edema. Howard heart size and mediastinal contours. No acute osseous abnormalities are seen. IMPRESSION: 1. No acute chest finding. Previous left pneumothorax is no longer seen. 2. Decreased left pleural effusion from prior exam. Electronically Signed   By: Narda Rutherford M.D.   On: 07/02/2021 01:06    EKG: Independently reviewed.  Ventricular-paced rhythm with rate 70   Labs on Admission: I have personally reviewed the available labs and imaging studies at the time of the admission.  Pertinent labs:   Glucose 155 BUN 29/Creatinine 1.32/GFR 48 - Howard Troponin 47, 61, 79 WBC 11.6 Hgb 11.4 COVID POSITIVE UA WNL   Assessment/Plan Principal Problem:   COVID-19 Active Problems:   Benign prostatic hyperplasia (BPH) with urinary urgency   Pacemaker   Vascular dementia, uncomplicated (HCC)   Chronic diastolic heart failure (HCC)   Falls frequently   Stage 3a chronic kidney disease (HCC)   Elevated troponin   Covid -Patient with presenting with fall, reports 2 days of URI symptoms at home and coughed during my evaluation but on RA -COVID POSITIVE -Full vaccinated, including as recently as 10 days ago -The patient has comorbidities which may increase the risk for ARDS/MODS including: age, CHF -COVID labs are pending -CXR currently without evidence of COVID-associated PNA -Will not treat with broad-spectrum antibiotics if procalcitonin <0.5 -Will observe for further evaluation, close monitoring, and treatment; if he does well, outpatient treatment may be appropriate in the next week or two -Monitor on telemetry x at  least 24 hours -At this time, will attempt to avoid use of aerosolized medications and use HFAs instead -Will check daily labs including BMP with Mag, Phos; LFTs; CBC with differential; CRP; ferritin; fibrinogen; D-dimer -Will order Remdesivir (pharmacy consult); hold steroids for now given normal CXR and no hypoxia -PT/OT consults -Encourage mobilization/ambulation as much as possible -Patient was seen wearing full PPE including: gown, gloves, head cover, N95, and face shield; donning and doffing was in compliance with current  standards.  Elevated troponin -Unlikely ACS with no c/o chest pain, minimal elevation -Suspect demand ischemia in the setting of active infection -Will monitor for now on telemetry  Fall -Appears to have been a mechanical fall -Negative head CT -Small superficial scalp laceration that is hemostatic -No other apparent injuries at this time  Chronic diastolic CHF -Appears to be compensated clinically -For now, will hold home Lasix dosing (recently decreased from 160 mg to 80 mg with apparently good results) - may be able to resume tomorrow -Has pacemaker   Dementia -Continue Aricept, melatonin -Family counseled about how patients with dementia often do better behaviorally while hospitalized if family members are present overnight  -Delirium precautions ordered -Will add haldol for prn use  Stage 3a CKD -Appears to be Howard at this time -Will follow   BPH -Continue Proscar   Gout -Continue Alloprinol, prn Colchicine -Lasix is likely exacerbating this issue but he has had fewer exacerbations since dose was decreased   Goals of care -Patient had an out of facility DNR in his chart from 2020, signed by his PCP -After discussion during his prior hospitalization, his son requested to rescind this DNR and make the patient full code -Ongoing discussion is encouraged, since resuscitation in this demographic is rarely successful and often leads to poor  outcomes       Level of care: Telemetry Cardiac DVT prophylaxis:  Lovenox Code Status:  Full - confirmed with family for now; ongoing discussion is encouraged. Family Communication: Daughter-in-law was present throughout evaluation Disposition Plan:  The patient is from: home  Anticipated d/c is to: home without [facility] services   Anticipated d/c date will depend on clinical response to treatment, but possibly as early as tomorrow if he has excellent response to treatment  Patient is currently: acutely ill Consults called: PT/OT/RT  Admission status:  It is my clinical opinion that referral for OBSERVATION is reasonable and necessary in this patient based on the above information provided. The aforementioned taken together are felt to place the patient at high risk for further clinical deterioration. However it is anticipated that the patient may be medically Howard for discharge from the hospital within 24 to 48 hours.    Jonah Blue MD [facility]   How to contact the [facility] Attending or Consulting provider 7A - 7P or covering provider during after hours 7P -7A, for this patient?  Check the care team in [facility] and look for a) attending/consulting [facility] provider listed and b) the [facility] team listed Log into [email or web address] and use [facility] universal password to access. If you do not have the password, please contact the hospital operator. Locate the [facility] provider you are looking for under [facility] and page to a number that you can be directly reached. If you still have difficulty reaching the provider, please page the [facility] (Director on Call) for the Hospitalists listed on amion for assistance.   07/02/2021, 10:22 AM

## 2021-07-02 NOTE — ED Provider Notes (Signed)
Emergency Medicine Provider Triage Evaluation Note  Matthew Howard , a 87 y.o. male  was evaluated in triage.  Pt complains of chemical fall around 7 PM.  Family member reports that he fell out of a kitchen chair backwards striking his head on the corner of a cabinet.  No loss of consciousness.  He seemed fine for the rest of the evening and then began to have weakness this evening.  She does report today he has had decreased appetite, cough and generalized weakness.  Around 10 PM he began vomiting and has had 4 episodes of vomiting.  No treatments prior to arrival no known aggravating or alleviating factors.  Review of Systems  Positive: Vomiting, cough, generalized weakness Negative: Measured fevers, syncope  Physical Exam  BP 135/68 (BP Location: Right Arm)   Pulse 69   Temp 99.1 F (37.3 C) (Oral)   Resp 20   Ht 5\' 11"  (1.803 m)   Wt 77.1 kg   SpO2 96%   BMI 23.71 kg/m  Gen:   Awake, no distress   Resp:  Normal effort  MSK:   Moves extremities without difficulty  Other:  Small wound to the posterior scalp.  Pupils equal round reactive to light.  Warm to touch.  Coughing.  Medical Decision Making  Medically screening exam initiated at 12:20 AM.  Appropriate orders placed.  was informed that the remainder of the evaluation will be completed by another provider, this initial triage assessment does not replace that evaluation, and the importance of remaining in the ED until their evaluation is complete.  Mechanical fall now with vomiting however patient also with generalized weakness, coughing and feeling unwell today.  Concern for possible COVID versus other illness/infection.  Will obtain CT scan, labs and reassess.   Melenda Bielak, [name] 07/02/21 07/04/21    [id number], MD 07/02/21 [phone]

## 2021-07-03 ENCOUNTER — Inpatient Hospital Stay (HOSPITAL_COMMUNITY): Payer: Medicare Other

## 2021-07-03 DIAGNOSIS — J40 Bronchitis, not specified as acute or chronic: Secondary | ICD-10-CM | POA: Diagnosis present

## 2021-07-03 DIAGNOSIS — Z7982 Long term (current) use of aspirin: Secondary | ICD-10-CM | POA: Diagnosis not present

## 2021-07-03 DIAGNOSIS — W07XXXA Fall from chair, initial encounter: Secondary | ICD-10-CM | POA: Diagnosis present

## 2021-07-03 DIAGNOSIS — Y92009 Unspecified place in unspecified non-institutional (private) residence as the place of occurrence of the external cause: Secondary | ICD-10-CM | POA: Diagnosis not present

## 2021-07-03 DIAGNOSIS — E785 Hyperlipidemia, unspecified: Secondary | ICD-10-CM | POA: Diagnosis present

## 2021-07-03 DIAGNOSIS — R059 Cough, unspecified: Secondary | ICD-10-CM | POA: Diagnosis present

## 2021-07-03 DIAGNOSIS — U071 COVID-19: Secondary | ICD-10-CM | POA: Diagnosis not present

## 2021-07-03 DIAGNOSIS — R296 Repeated falls: Secondary | ICD-10-CM | POA: Diagnosis present

## 2021-07-03 DIAGNOSIS — I248 Other forms of acute ischemic heart disease: Secondary | ICD-10-CM | POA: Diagnosis present

## 2021-07-03 DIAGNOSIS — N1831 Chronic kidney disease, stage 3a: Secondary | ICD-10-CM | POA: Diagnosis present

## 2021-07-03 DIAGNOSIS — I5031 Acute diastolic (congestive) heart failure: Secondary | ICD-10-CM | POA: Diagnosis not present

## 2021-07-03 DIAGNOSIS — Z91048 Other nonmedicinal substance allergy status: Secondary | ICD-10-CM | POA: Diagnosis not present

## 2021-07-03 DIAGNOSIS — F0150 Vascular dementia without behavioral disturbance: Secondary | ICD-10-CM | POA: Diagnosis present

## 2021-07-03 DIAGNOSIS — Z950 Presence of cardiac pacemaker: Secondary | ICD-10-CM | POA: Diagnosis not present

## 2021-07-03 DIAGNOSIS — N401 Enlarged prostate with lower urinary tract symptoms: Secondary | ICD-10-CM | POA: Diagnosis present

## 2021-07-03 DIAGNOSIS — I4891 Unspecified atrial fibrillation: Secondary | ICD-10-CM | POA: Diagnosis present

## 2021-07-03 DIAGNOSIS — R3915 Urgency of urination: Secondary | ICD-10-CM | POA: Diagnosis present

## 2021-07-03 DIAGNOSIS — I5032 Chronic diastolic (congestive) heart failure: Secondary | ICD-10-CM | POA: Diagnosis present

## 2021-07-03 DIAGNOSIS — R7989 Other specified abnormal findings of blood chemistry: Secondary | ICD-10-CM | POA: Diagnosis not present

## 2021-07-03 DIAGNOSIS — Z79899 Other long term (current) drug therapy: Secondary | ICD-10-CM | POA: Diagnosis not present

## 2021-07-03 DIAGNOSIS — M109 Gout, unspecified: Secondary | ICD-10-CM | POA: Diagnosis present

## 2021-07-03 DIAGNOSIS — S0101XA Laceration without foreign body of scalp, initial encounter: Secondary | ICD-10-CM | POA: Diagnosis present

## 2021-07-03 LAB — C-REACTIVE PROTEIN: CRP: 9.9 mg/dL — ABNORMAL HIGH (ref <1.0)

## 2021-07-03 LAB — CBC WITH DIFFERENTIAL/PLATELET
Abs Immature Granulocytes: 0.05 10*3/uL (ref 0.00–0.07)
Basophils Absolute: 0 10*3/uL (ref 0.0–0.1)
Basophils Relative: 0 %
Eosinophils Absolute: 0.1 10*3/uL (ref 0.0–0.5)
Eosinophils Relative: 1 %
HCT: 36.2 % — ABNORMAL LOW (ref 39.0–52.0)
Hemoglobin: 10.9 g/dL — ABNORMAL LOW (ref 13.0–17.0)
Immature Granulocytes: 0 %
Lymphocytes Relative: 12 %
Lymphs Abs: 1.6 10*3/uL (ref 0.7–4.0)
MCH: 32.9 pg (ref 26.0–34.0)
MCHC: 30.1 g/dL (ref 30.0–36.0)
MCV: 109.4 fL — ABNORMAL HIGH (ref 80.0–100.0)
Monocytes Absolute: 2.6 10*3/uL — ABNORMAL HIGH (ref 0.1–1.0)
Monocytes Relative: 19 %
Neutro Abs: 9.2 10*3/uL — ABNORMAL HIGH (ref 1.7–7.7)
Neutrophils Relative %: 68 %
Platelets: 128 10*3/uL — ABNORMAL LOW (ref 150–400)
RBC: 3.31 MIL/uL — ABNORMAL LOW (ref 4.22–5.81)
RDW: 16.5 % — ABNORMAL HIGH (ref 11.5–15.5)
WBC: 13.6 10*3/uL — ABNORMAL HIGH (ref 4.0–10.5)
nRBC: 0 % (ref 0.0–0.2)

## 2021-07-03 LAB — COMPREHENSIVE METABOLIC PANEL
ALT: 13 U/L (ref 0–44)
AST: 25 U/L (ref 15–41)
Albumin: 3.1 g/dL — ABNORMAL LOW (ref 3.5–5.0)
Alkaline Phosphatase: 53 U/L (ref 38–126)
Anion gap: 8 (ref 5–15)
BUN: 30 mg/dL — ABNORMAL HIGH (ref 8–23)
CO2: 32 mmol/L (ref 22–32)
Calcium: 8.6 mg/dL — ABNORMAL LOW (ref 8.9–10.3)
Chloride: 100 mmol/L (ref 98–111)
Creatinine, Ser: 1.35 mg/dL — ABNORMAL HIGH (ref 0.61–1.24)
GFR, Estimated: 47 mL/min — ABNORMAL LOW (ref >60)
Glucose, Bld: 94 mg/dL (ref 70–99)
Potassium: 3.8 mmol/L (ref 3.5–5.1)
Sodium: 140 mmol/L (ref 135–145)
Total Bilirubin: 1 mg/dL (ref 0.3–1.2)
Total Protein: 6.4 g/dL — ABNORMAL LOW (ref 6.5–8.1)

## 2021-07-03 LAB — MAGNESIUM: Magnesium: 2.4 mg/dL (ref 1.7–2.4)

## 2021-07-03 LAB — D-DIMER, QUANTITATIVE: D-Dimer, Quant: 4.12 ug/mL-FEU — ABNORMAL HIGH (ref 0.00–0.50)

## 2021-07-03 MED ORDER — LACTULOSE 10 GM/15ML PO SOLN
30.0000 g | Freq: Two times a day (BID) | ORAL | Status: DC | PRN
  Administered 2021-07-05: 30 g via ORAL
  Filled 2021-07-03: qty 45

## 2021-07-03 MED ORDER — ENOXAPARIN SODIUM 40 MG/0.4ML IJ SOSY
40.0000 mg | PREFILLED_SYRINGE | Freq: Two times a day (BID) | INTRAMUSCULAR | Status: DC
  Administered 2021-07-03 – 2021-07-05 (×5): 40 mg via SUBCUTANEOUS
  Filled 2021-07-03 (×5): qty 0.4

## 2021-07-03 MED ORDER — DOXYCYCLINE HYCLATE 100 MG PO TABS
100.0000 mg | ORAL_TABLET | Freq: Two times a day (BID) | ORAL | Status: DC
  Administered 2021-07-03 – 2021-07-06 (×7): 100 mg via ORAL
  Filled 2021-07-03 (×7): qty 1

## 2021-07-03 MED ORDER — AZITHROMYCIN 250 MG PO TABS: 250.0000 mg | ORAL_TABLET | Freq: Every day | ORAL | Status: DC

## 2021-07-03 MED ORDER — FUROSEMIDE 20 MG PO TABS
40.0000 mg | ORAL_TABLET | Freq: Once | ORAL | Status: AC
  Administered 2021-07-03: 40 mg via ORAL
  Filled 2021-07-03: qty 2

## 2021-07-03 MED ORDER — DEXAMETHASONE SODIUM PHOSPHATE 10 MG/ML IJ SOLN
6.0000 mg | INTRAMUSCULAR | Status: DC
  Administered 2021-07-03 – 2021-07-05 (×3): 6 mg via INTRAVENOUS
  Filled 2021-07-03 (×3): qty 1

## 2021-07-03 MED ORDER — AZITHROMYCIN 250 MG PO TABS: 500.0000 mg | ORAL_TABLET | Freq: Once | ORAL | Status: DC

## 2021-07-03 NOTE — Progress Notes (Signed)
OT Cancellation Note  Patient Details Name: Matthew Howard MRN: [id number] DOB: January 07, 1919   Cancelled Treatment:    Reason Eval/Treat Not Completed: Other --pt currently off the floor for testing.  [name], OTR/L Acute Rehab Services Pager [phone] Office [phone]    [name] 07/03/2021, 2:22 PM

## 2021-07-03 NOTE — Progress Notes (Signed)
PT Cancellation Note  Patient Details Name: Matthew Howard MRN: [id number] DOB: [birth year]   Cancelled Treatment:    Reason Eval/Treat Not Completed: Other (comment). Pt was off the floor, unable to see.  Retry as time and pt allow.   Ivar Drape 07/03/2021, 2:21 PM  Samul Dada, PT MS Acute Rehab Dept. Number: [facility] [id number] and [facility] [phone]

## 2021-07-03 NOTE — ED Notes (Signed)
Placed Breakfast order 

## 2021-07-03 NOTE — Progress Notes (Addendum)
PROGRESS NOTE                                                                                                                                                                                                             Patient Demographics:    Matthew Howard, is a 87 y.o. male, DOB - 11-03-1918, WUJ:[id number]  Outpatient Primary MD for the patient is [name], MD    LOS - 0  Admit date - 07/02/2021    Chief Complaint  Patient presents with   Fall       Brief Narrative (HPI from H&P)  Matthew Howard is a 87 y.o. male with medical history significant of afib; BPH; HLD; dementia; and pacemaker placement presenting with a fall.  His DIL reports that he has had URI symptoms the last couple of days - rhinorrhea and cough.  He received his COVID booster 10 days ago.  He was diagnosed with acute COVID-19 infection along with bacterial bronchitis and admitted to the hospital.   Subjective:    Matthew Howard today has, No headache, No chest pain, No abdominal pain - No Nausea, No new weakness tingling or numbness, positive productive cough for the last 3 days, minimal shortness of breath.   Assessment  & Plan :     Acute COVID-19 infection with ongoing bacterial bronchitis - he has had a productive cough for the last 3 to 4 days, does have mild leukocytosis and some shortness of breath, chest x-ray is nonacute and I think he currently has bronchitis, for which she will be placed on azithromycin, sputum gram stain and culture, since he has been started on remdesivir will complete 3 doses.  Will monitor inflammatory markers.  He is fully vaccinated and should not get severe COVID disease.  Encouraged the patient to sit up in chair in the daytime use I-S and flutter valve for pulmonary toiletry.  Will advance activity and titrate down oxygen as possible.   2.  Mechanical fall.  Small laceration back of the scalp, stable,  head CT nonacute.  PT OT and monitor.  He never lost consciousness.  3.  Chronic diastolic CHF.  EF on echocardiogram in 2020.  Currently compensated.  Minimal elevation of troponin likely due to mild demand chest pain.  Nonacute EKG.  Continue home dose aspirin, no further treatment.  4.  History of gout.  Continue allopurinol.  5.  BPH.  Continue Proscar.  6. High D dimer - check Leg [place].      Condition - Extremely Guarded  Family Communication  : Daughter bedside 07/03/2021  Code Status :  Full  Consults  :  None  PUD Prophylaxis :     Procedures  :     CT Head  - Non acute      Disposition Plan  :    Status is: Observation  Dispo: The patient is from: Home              Anticipated d/c is to: Home              Patient currently is not medically stable to d/c.   Difficult to place patient No   DVT Prophylaxis  :    enoxaparin (LOVENOX) injection 40 mg Start: 07/02/21 0900  Lab Results  Component Value Date   PLT 148 (L) 07/02/2021    Diet :  Diet Order             Diet regular Room service appropriate? Yes; Fluid consistency: Thin; Fluid restriction: 1500 mL Fluid  Diet effective now                    Inpatient Medications  Scheduled Meds:  allopurinol  200 mg Oral Daily   vitamin C  500 mg Oral Daily   aspirin EC  81 mg Oral Daily   docusate sodium  100 mg Oral BID   donepezil  5 mg Oral Daily   doxycycline  100 mg Oral Q12H   enoxaparin (LOVENOX) injection  40 mg Subcutaneous Q24H   finasteride  5 mg Oral Daily   melatonin  5 mg Oral QHS   polyethylene glycol  17 g Oral Daily   sodium chloride flush  3 mL Intravenous Q12H   zinc sulfate  220 mg Oral Daily   Continuous Infusions:  remdesivir 100 mg in NS 100 mL Stopped (07/03/21 1016)   PRN Meds:.acetaminophen, albuterol, bisacodyl, chlorpheniramine-HYDROcodone, guaiFENesin-dextromethorphan, haloperidol lactate, lactulose, [DISCONTINUED] ondansetron **OR** ondansetron (ZOFRAN)  IV  Antibiotics  :    Anti-infectives (From admission, onward)    Start     Dose/Rate Route Frequency Ordered Stop   07/04/21 1000  azithromycin (ZITHROMAX) tablet 250 mg  Status:  Discontinued        250 mg Oral Daily 07/03/21 0852 07/03/21 0914   07/03/21 1000  remdesivir 100 mg in sodium chloride 0.9 % 100 mL IVPB       See Hyperspace for full Linked Orders Report.   100 mg 200 mL/hr over 30 Minutes Intravenous Daily 07/02/21 0859 07/07/21 0959   07/03/21 1000  doxycycline (VIBRA-TABS) tablet 100 mg        100 mg Oral Every 12 hours 07/03/21 0914 07/08/21 0959   07/03/21 0900  azithromycin (ZITHROMAX) tablet 500 mg  Status:  Discontinued        500 mg Oral  Once 07/03/21 0851 07/03/21 0914   07/02/21 0900  remdesivir 200 mg in sodium chloride 0.9% 250 mL IVPB       See Hyperspace for full Linked Orders Report.   200 mg 580 mL/hr over 30 Minutes Intravenous Once 07/02/21 0859 07/02/21 1500        Time Spent in minutes  30   Susa Raring M.D on 07/03/2021 at 10:22 AM  To page go to [email or web address]   [facility] -  Office  [phone]    See all Orders from today for further details    Objective:   Vitals:   07/03/21 0900 07/03/21 0915 07/03/21 1000 07/03/21 1015  BP: (!) 129/53 (!) 127/55 (!) 132/49 (!) 127/50  Pulse: 69 70 70 69  Resp: (!) 25 (!) 22 (!) 27 (!) 25  Temp:      TempSrc:      SpO2: 100% 100% 94% 95%  Weight:      Height:        Wt Readings from Last 3 Encounters:  07/02/21 77.1 kg  06/01/21 77.1 kg  05/17/21 78 kg     Intake/Output Summary (Last 24 hours) at 07/03/2021 1022 Last data filed at 07/03/2021 1016 Gross per 24 hour  Intake 100.37 ml  Output --  Net 100.37 ml     Physical Exam  Awake Alert, No new F.N deficits, small laceration on the right side of the scalp - stable Matthew Howard.AT,PERRAL Supple Neck,No JVD, No cervical lymphadenopathy appriciated.  Symmetrical Chest wall movement, Good air movement bilaterally, few  rales RRR,No Gallops,Rubs or new Murmurs, No Parasternal Heave +ve B.Sounds, Abd Soft, No tenderness, No organomegaly appriciated, No rebound - guarding or rigidity. No Cyanosis, Clubbing or edema, No new Rash or bruise      Data Review:    CBC Recent Labs  Lab 07/02/21 0036  WBC 11.6*  HGB 11.4*  HCT 37.2*  PLT 148*  MCV 106.9*  MCH 32.8  MCHC 30.6  RDW 16.1*  LYMPHSABS 1.0  MONOABS 1.5*  EOSABS 0.0  BASOSABS 0.0    Recent Labs  Lab 07/02/21 0036 07/02/21 0941  NA 140  --   K 4.2  --   CL 99  --   CO2 29  --   GLUCOSE 155*  --   BUN 29*  --   CREATININE 1.32*  --   CALCIUM 9.0  --   AST 22  --   ALT 12  --   ALKPHOS 64  --   BILITOT 0.8  --   ALBUMIN 3.6  --   CRP  --  3.8*  DDIMER  --  3.83*  PROCALCITON  --  <0.10    ------------------------------------------------------------------------------------------------------------------ No results for input(s): CHOL, HDL, LDLCALC, TRIG, CHOLHDL, LDLDIRECT in the last 72 hours.  No results found for: HGBA1C ------------------------------------------------------------------------------------------------------------------ No results for input(s): TSH, T4TOTAL, T3FREE, THYROIDAB in the last 72 hours.  Invalid input(s): FREET3  Cardiac Enzymes No results for input(s): CKMB, TROPONINI, MYOGLOBIN in the last 168 hours.  Invalid input(s): CK ------------------------------------------------------------------------------------------------------------------    Component Value Date/Time   BNP 175.5 (H) 05/05/2021 1612    Radiology Reports CT HEAD WO CONTRAST ( )  Result Date: 07/02/2021 CLINICAL DATA:  Head trauma, mod-severe 87 year old post fall EXAM: CT HEAD WITHOUT CONTRAST TECHNIQUE: Contiguous axial images were obtained from the base of the skull through the vertex without intravenous contrast. COMPARISON:  Head CT 07/14/2020 FINDINGS: Brain: Stable atrophy and chronic small vessel ischemia. No  intracranial hemorrhage, mass effect, or midline shift. No hydrocephalus. The basilar cisterns are patent. No evidence of territorial infarct or acute ischemia. No extra-axial or intracranial fluid collection. Vascular: Atherosclerosis of skullbase vasculature without hyperdense vessel or abnormal calcification. Skull: No fracture or focal lesion. Sinuses/Orbits: Paranasal sinuses and mastoid air cells are clear. The visualized orbits are unremarkable. Bilateral cataract resection. Other: None. IMPRESSION: 1. No acute intracranial abnormality. No skull fracture. 2. Stable atrophy and chronic small vessel ischemia.  Electronically Signed   By: Narda Rutherford M.D.   On: 07/02/2021 01:11   CT Cervical Spine Wo Contrast  Result Date: 07/02/2021 CLINICAL DATA:  87 year old post fall. Neck trauma (Age >= 65y) EXAM: CT CERVICAL SPINE WITHOUT CONTRAST TECHNIQUE: Multidetector CT imaging of the cervical spine was performed without intravenous contrast. Multiplanar CT image reconstructions were also generated. COMPARISON:  None. FINDINGS: Alignment: Normal. Skull base and vertebrae: No acute fracture. Vertebral body heights are maintained. The dens and skull base are intact. Soft tissues and spinal canal: No prevertebral fluid or swelling. No visible canal hematoma. Disc levels: Degenerative disc disease is most prominent at C6-C7, to a lesser extent C5-C6 and C7-T1. Mild scattered facet hypertrophy. Upper chest: No acute or unexpected findings Other: None. IMPRESSION: Degenerative change in the cervical spine without acute fracture or subluxation. Electronically Signed   By: Narda Rutherford M.D.   On: 07/02/2021 01:14   DG Chest Port 1 View  Result Date: 07/02/2021 CLINICAL DATA:  87 year old post fall. EXAM: PORTABLE CHEST 1 VIEW COMPARISON:  05/17/2021 FINDINGS: Left-sided pacemaker in place. The previous left pneumothorax is no longer seen. Decreased left pleural effusion from prior exam. Trace residual blunting  of the costophrenic angle. No new airspace disease. No pulmonary edema. Stable heart size and mediastinal contours. No acute osseous abnormalities are seen. IMPRESSION: 1. No acute chest finding. Previous left pneumothorax is no longer seen. 2. Decreased left pleural effusion from prior exam. Electronically Signed   By: Narda Rutherford M.D.   On: 07/02/2021 01:06

## 2021-07-04 ENCOUNTER — Inpatient Hospital Stay (HOSPITAL_COMMUNITY): Payer: Medicare Other

## 2021-07-04 DIAGNOSIS — R7989 Other specified abnormal findings of blood chemistry: Secondary | ICD-10-CM

## 2021-07-04 DIAGNOSIS — U071 COVID-19: Secondary | ICD-10-CM | POA: Diagnosis not present

## 2021-07-04 LAB — COMPREHENSIVE METABOLIC PANEL
ALT: 11 U/L (ref 0–44)
AST: 22 U/L (ref 15–41)
Albumin: 2.9 g/dL — ABNORMAL LOW (ref 3.5–5.0)
Alkaline Phosphatase: 56 U/L (ref 38–126)
Anion gap: 6 (ref 5–15)
BUN: 33 mg/dL — ABNORMAL HIGH (ref 8–23)
CO2: 30 mmol/L (ref 22–32)
Calcium: 8.5 mg/dL — ABNORMAL LOW (ref 8.9–10.3)
Chloride: 100 mmol/L (ref 98–111)
Creatinine, Ser: 1.15 mg/dL (ref 0.61–1.24)
GFR, Estimated: 57 mL/min — ABNORMAL LOW (ref >60)
Glucose, Bld: 134 mg/dL — ABNORMAL HIGH (ref 70–99)
Potassium: 3.8 mmol/L (ref 3.5–5.1)
Sodium: 136 mmol/L (ref 135–145)
Total Bilirubin: 0.7 mg/dL (ref 0.3–1.2)
Total Protein: 6.2 g/dL — ABNORMAL LOW (ref 6.5–8.1)

## 2021-07-04 LAB — CBC WITH DIFFERENTIAL/PLATELET
Abs Immature Granulocytes: 0.07 10*3/uL (ref 0.00–0.07)
Basophils Absolute: 0 10*3/uL (ref 0.0–0.1)
Basophils Relative: 0 %
Eosinophils Absolute: 0 10*3/uL (ref 0.0–0.5)
Eosinophils Relative: 0 %
HCT: 33.7 % — ABNORMAL LOW (ref 39.0–52.0)
Hemoglobin: 10.7 g/dL — ABNORMAL LOW (ref 13.0–17.0)
Immature Granulocytes: 1 %
Lymphocytes Relative: 11 %
Lymphs Abs: 1.3 10*3/uL (ref 0.7–4.0)
MCH: 33.3 pg (ref 26.0–34.0)
MCHC: 31.8 g/dL (ref 30.0–36.0)
MCV: 105 fL — ABNORMAL HIGH (ref 80.0–100.0)
Monocytes Absolute: 1.3 10*3/uL — ABNORMAL HIGH (ref 0.1–1.0)
Monocytes Relative: 10 %
Neutro Abs: 9.5 10*3/uL — ABNORMAL HIGH (ref 1.7–7.7)
Neutrophils Relative %: 78 %
Platelets: 145 10*3/uL — ABNORMAL LOW (ref 150–400)
RBC: 3.21 MIL/uL — ABNORMAL LOW (ref 4.22–5.81)
RDW: 16.1 % — ABNORMAL HIGH (ref 11.5–15.5)
WBC: 12.1 10*3/uL — ABNORMAL HIGH (ref 4.0–10.5)
nRBC: 0 % (ref 0.0–0.2)

## 2021-07-04 LAB — D-DIMER, QUANTITATIVE: D-Dimer, Quant: 4.26 ug/mL-FEU — ABNORMAL HIGH (ref 0.00–0.50)

## 2021-07-04 LAB — C-REACTIVE PROTEIN: CRP: 14.7 mg/dL — ABNORMAL HIGH (ref <1.0)

## 2021-07-04 LAB — MAGNESIUM: Magnesium: 2.4 mg/dL (ref 1.7–2.4)

## 2021-07-04 MED ORDER — FUROSEMIDE 40 MG PO TABS
40.0000 mg | ORAL_TABLET | Freq: Every day | ORAL | Status: DC
  Administered 2021-07-04 – 2021-07-06 (×3): 40 mg via ORAL
  Filled 2021-07-04 (×3): qty 1

## 2021-07-04 NOTE — Progress Notes (Signed)
Occupational Therapy Evaluation Patient Details Name: Matthew Howard MRN: [id number] DOB: Jul 16, 1919 Today's Date: 07/04/2021   History of Present Illness Matthew Howard is a 87 y.o. male with medical history significant of afib; BPH; HLD; dementia; and pacemaker placement presenting with a fall.  His DIL reports that he has had URI symptoms the last couple of days - rhinorrhea and cough.  He received his COVID booster 10 days ago.  He was diagnosed with acute COVID-19 infection along with bacterial bronchitis and admitted to the hospital.   Clinical Impression   Pt presents with above diagnosis. PTA pt PLOF living at home with family in 2 level house, able to live on main level with steps to enter front door. Daughter in law at bedside reports assisting patient with ADLs and IADLs and aide coming in during PM hours for safety.  Pt is currently limited with safe ADL engagement due to weakness, impaired balance, decreased activity tolerance, decreased safety awareness, and processing skills. Pt will benefit to continue skilled level OT in acute for caregiver/pt education for AE, compensatory strategies, energy conservation techniques, and safety awareness.  DC recommendation to [facility] to maximize independence for transition to home setting.      Recommendations for follow up therapy are one component of a multi-disciplinary discharge planning process, led by the attending physician.  Recommendations may be updated based on patient status, additional functional criteria and insurance authorization.   Follow Up Recommendations  Home health OT;Supervision/Assistance - 24 hour    Equipment Recommendations  3 in 1 bedside commode    Recommendations for Other Services       Precautions / Restrictions Precautions Precautions: Fall Restrictions Weight Bearing Restrictions: No      Mobility Bed Mobility Overal bed mobility: Modified Independent Bed Mobility: Supine to Sit;Sit to Supine      Supine to sit: Min guard Sit to supine: Min guard   General bed mobility comments: No physical assistance required however, pt required increased time and effort due to weakness.    Transfers Overall transfer level: Needs assistance Equipment used: Quad cane Transfers: Sit to/from Stand Sit to Stand: Min guard        Lateral/Scoot Transfers: Min assist (min A with cueing for sequencing and to steady with side steps.)      Balance Overall balance assessment: Needs assistance;History of Falls Sitting-balance support: Feet supported Sitting balance-Leahy Scale: Good     Standing balance support: Single extremity supported;During functional activity Standing balance-Leahy Scale: Fair Standing balance comment: reliant on single UE support and min guard for safety                           ADL either performed or assessed with clinical judgement   ADL Overall ADL's : Needs assistance/impaired Eating/Feeding: Set up;Sitting   Grooming: Set up;Sitting           Upper Body Dressing : Set up;Sitting   Lower Body Dressing: Minimal assistance;Sitting/lateral leans Lower Body Dressing Details (indicate cue type and reason): Pt demonstrating doffing of socks while seated EOB, however, limited with sitting balance to don. Pt requiring min A to initiate threading of feet into socks. Pt demonstrates poor dynamic sitting and observed with posterior lean during attempt of LB dressing. Daughter in law reports mostly helping with shoes and socks. Toilet Transfer: Minimal assistance;Moderate assistance Toilet Transfer Details (indicate cue type and reason): sit to stand and lateral steps at EOB to assess stability  with quad cane positioned in room to assess progress for toilet/functional transfers.. Pt required mod A with initial push up to stand, and min A to mod A for safety with static and dynamic standing balance. Pt demonstrates instability and pt/caregiver educated with safe  stand to sit transition to reach for seated surface prior to sitting for controlled descend.           General ADL Comments: During LB dressing, pt desat to 88% on RA and able to recover with rest and education for breathing technique, improved to 97%     Vision Baseline Vision/History: 1 Wears glasses (readers) Ability to See in Adequate Light: 0 Adequate       Perception     Praxis      Pertinent Vitals/Pain Pain Assessment: No/denies pain     Hand Dominance Right   Extremity/Trunk Assessment Upper Extremity Assessment Upper Extremity Assessment: Defer to OT evaluation   Lower Extremity Assessment Lower Extremity Assessment: Overall WFL for tasks assessed   Cervical / Trunk Assessment Cervical / Trunk Assessment: Normal   Communication Communication Communication: HOH   Cognition Arousal/Alertness: Awake/alert Behavior During Therapy: WFL for tasks assessed/performed Overall Cognitive Status: History of cognitive impairments - at baseline                                 General Comments: Daughter at bedside reports pt having history of decreased memory. Short blessed test administered with score of 18/28 which indicates impairment consistent with dementia.   General Comments  Start of session 76 bpm =HR,   97% O2 on RA, 139/61 BP   End of session 69 bpm HR,  141/56 BP,  88-97% on RA    Exercises     Shoulder Instructions      Home Living Family/patient expects to be discharged to:: Private residence Living Arrangements: Children Available Help at Discharge: Family;Personal care attendant Type of Home: House Home Access: Stairs to enter [employer] of Steps: 4 Entrance Stairs-Rails: Can reach both Home Layout: Able to live on main level with bedroom/bathroom;Two level Alternate Level Stairs-Number of Steps: flight   Bathroom Shower/Tub: Producer, television/film/video: Handicapped height Bathroom Accessibility: Yes How  Accessible: Accessible via walker Home Equipment: Walker - 2 wheels;Cane - quad   Additional Comments: Daughter in law at bedside states that she assist with pt during the day with ADLs and IADLs, aides assist during PM for safety with functional transfers as needed.      Prior Functioning/Environment Level of Independence: Independent with assistive device(s)  Gait / Transfers Assistance Needed: reports use of quad cane for functional mobility. ADL's / Homemaking Assistance Needed: Daughter and aides assist with ADLs and IADLs.   Comments: Patient reports he has a walker, no family in room to verify home information.        OT Problem List: Decreased strength;Decreased activity tolerance;Impaired balance (sitting and/or standing);Decreased coordination;Decreased safety awareness;Decreased knowledge of use of DME or AE;Decreased knowledge of precautions;Decreased cognition      OT Treatment/Interventions: Self-care/ADL training;Therapeutic exercise;Energy conservation;DME and/or AE instruction;Therapeutic activities;Cognitive remediation/compensation;Patient/family education;Balance training    OT Goals(Current goals can be found in the care plan section) Acute Rehab OT Goals Patient Stated Goal: to go  home OT Goal Formulation: With patient/family Time For Goal Achievement: 07/18/21 Potential to Achieve Goals: Fair  OT Frequency: Min 2X/week   Barriers to D/C:  Co-evaluation              AM-PAC OT "6 Clicks" Daily Activity     Outcome Measure Help from another person eating meals?: None Help from another person taking care of personal grooming?: A Little Help from another person toileting, which includes using toliet, bedpan, or urinal?: A Little Help from another person bathing (including washing, rinsing, drying)?: A Little Help from another person to put on and taking off regular upper body clothing?: A Little Help from another person to put on and taking  off regular lower body clothing?: A Little 6 Click Score: 19   End of Session Equipment Utilized During Treatment: Gait belt (quad cane) Nurse Communication: Mobility status  Activity Tolerance: Patient tolerated treatment well Patient left: in bed;with call bell/phone within reach;with bed alarm set;with family/visitor present  OT Visit Diagnosis: Unsteadiness on feet (R26.81);Repeated falls (R29.6);Muscle weakness (generalized) (M62.81);History of falling (Z91.81)                Time: 2841-3244 OT Time Calculation (min): 43 min Charges:  OT General Charges $OT Visit: 1 Visit OT Evaluation $OT Eval Moderate Complexity: 1 Mod OT Treatments $Self Care/Home Management : 23-37 mins  [place] Old, MSOT, OTR/L  Supplemental Rehabilitation Services  [phone]   Zigmund Daniel 07/04/2021, 11:53 AM

## 2021-07-04 NOTE — Plan of Care (Signed)
  Problem: Clinical Measurements: Goal: Will remain free from infection Outcome: Progressing   Problem: Activity: Goal: Risk for activity intolerance will decrease Outcome: Progressing   

## 2021-07-04 NOTE — Progress Notes (Signed)
PROGRESS NOTE                                                                                                                                                                                                             Patient Demographics:    Matthew Howard, is a 87 y.o. male, DOB - [birth year], YPP:[id number]  Outpatient Primary MD for the patient is [name], MD    LOS - 1  Admit date - 07/02/2021    Chief Complaint  Patient presents with   Fall       Brief Narrative (HPI from H&P)  Matthew Howard is a 87 y.o. male with medical history significant of afib; BPH; HLD; dementia; and pacemaker placement presenting with a fall.  His DIL reports that he has had URI symptoms the last couple of days - rhinorrhea and cough.  He received his COVID booster 10 days ago.  He was diagnosed with acute COVID-19 infection along with bacterial bronchitis and admitted to the hospital.   Subjective:   Patient in bed, appears comfortable, denies any headache, no fever, no chest pain or pressure, improved cough and shortness of breath , no abdominal pain. No new focal weakness.    Assessment  & Plan :     Acute COVID-19 infection with ongoing bacterial bronchitis - he has had a productive cough for the last 3 to 4 days, does have mild leukocytosis and some shortness of breath, chest x-ray is nonacute and I think he currently has bronchitis, for which she will be placed on azithromycin, sputum gram stain and culture, since he has been started on remdesivir will complete 3 doses.  Will monitor inflammatory markers.  He is fully vaccinated and should not get severe COVID disease.  Encouraged the patient to sit up in chair in the daytime use I-S and flutter valve for pulmonary toiletry.  Will advance activity and titrate down oxygen as possible.   2.  Mechanical fall.  Small laceration back of the scalp, stable, head CT nonacute.  PT OT  and monitor.  He never lost consciousness.  3.  Chronic diastolic CHF.  EF on echocardiogram in 2020.  Currently compensated.  Minimal elevation of troponin likely due to mild demand chest pain.  Nonacute EKG.  Continue home dose aspirin, no further treatment.  4.  History  of gout.  Continue allopurinol.  5.  BPH.  Continue Proscar.  6. High D dimer - check Leg [place], moderate dose Lovenox.       Condition - Guarded  Family Communication  : Daughter bedside 07/03/2021, daughter Matthew Howard [phone] - 07/04/21, daughter in law Matthew Howard [phone] - 07/04/21  Code Status :  Full  Consults  :  None  PUD Prophylaxis :     Procedures  :     Leg [place]   CT Head  - Non acute      Disposition Plan  :    Status is: Inpt  Dispo: The patient is from: Home              Anticipated d/c is to: Home              Patient currently is not medically stable to d/c.   Difficult to place patient No   DVT Prophylaxis  :    enoxaparin (LOVENOX) injection 40 mg Start: 07/03/21 2123  Lab Results  Component Value Date   PLT 145 (L) 07/04/2021    Diet :  Diet Order             Diet regular Room service appropriate? Yes; Fluid consistency: Thin; Fluid restriction: 1500 mL Fluid  Diet effective now                    Inpatient Medications  Scheduled Meds:  allopurinol  200 mg Oral Daily   vitamin C  500 mg Oral Daily   aspirin EC  81 mg Oral Daily   dexamethasone (DECADRON) injection  6 mg Intravenous Q24H   docusate sodium  100 mg Oral BID   donepezil  5 mg Oral Daily   doxycycline  100 mg Oral Q12H   enoxaparin (LOVENOX) injection  40 mg Subcutaneous Q12H   finasteride  5 mg Oral Daily   melatonin  5 mg Oral QHS   polyethylene glycol  17 g Oral Daily   sodium chloride flush  3 mL Intravenous Q12H   zinc sulfate  220 mg Oral Daily   Continuous Infusions:  remdesivir 100 mg in NS 100 mL Stopped (07/03/21 1016)   PRN Meds:.acetaminophen, albuterol, bisacodyl,  chlorpheniramine-HYDROcodone, guaiFENesin-dextromethorphan, haloperidol lactate, lactulose, [DISCONTINUED] ondansetron **OR** ondansetron (ZOFRAN) IV  Antibiotics  :    Anti-infectives (From admission, onward)    Start     Dose/Rate Route Frequency Ordered Stop   07/04/21 1000  azithromycin (ZITHROMAX) tablet 250 mg  Status:  Discontinued        250 mg Oral Daily 07/03/21 0852 07/03/21 0914   07/03/21 1000  remdesivir 100 mg in sodium chloride 0.9 % 100 mL IVPB       See Hyperspace for full Linked Orders Report.   100 mg 200 mL/hr over 30 Minutes Intravenous Daily 07/02/21 0859 07/07/21 0959   07/03/21 1000  doxycycline (VIBRA-TABS) tablet 100 mg        100 mg Oral Every 12 hours 07/03/21 0914 07/08/21 0959   07/03/21 0900  azithromycin (ZITHROMAX) tablet 500 mg  Status:  Discontinued        500 mg Oral  Once 07/03/21 0851 07/03/21 0914   07/02/21 0900  remdesivir 200 mg in sodium chloride 0.9% 250 mL IVPB       See Hyperspace for full Linked Orders Report.   200 mg 580 mL/hr over 30 Minutes Intravenous Once 07/02/21 0859 07/02/21 1500  Time Spent in minutes  30   Susa Raring M.D on 07/04/2021 at 9:11 AM  To page go to [email or web address]   [facility] -  Office  [phone]    See all Orders from today for further details    Objective:   Vitals:   07/03/21 2321 07/04/21 0000 07/04/21 0400 07/04/21 0600  BP: (!) 119/48 (!) 118/52 (!) 126/56 (!) 131/56  Pulse: 70 70 70 70  Resp: (!) 29 20 (!) 21 (!) 23  Temp: (!) 97.5 F (36.4 C)  98 F (36.7 C)   TempSrc: Oral  Oral   SpO2: 96% 97% 96% 96%  Weight:   78.6 kg   Height:        Wt Readings from Last 3 Encounters:  07/04/21 78.6 kg  06/01/21 77.1 kg  05/17/21 78 kg     Intake/Output Summary (Last 24 hours) at 07/04/2021 0911 Last data filed at 07/03/2021 2044 Gross per 24 hour  Intake 340.37 ml  Output --  Net 340.37 ml     Physical Exam  Awake Alert, No new F.N deficits, small laceration on  the right side of the scalp - stable [place].AT,PERRAL Supple Neck,No JVD, No cervical lymphadenopathy appriciated.  Symmetrical Chest wall movement, Good air movement bilaterally, CTAB RRR,No Gallops, Rubs or new Murmurs, No Parasternal Heave +ve B.Sounds, Abd Soft, No tenderness, No organomegaly appriciated, No rebound - guarding or rigidity. No Cyanosis, Clubbing or edema, No new Rash or bruise    Data Review:    CBC Recent Labs  Lab 07/02/21 0036 07/03/21 0933 07/04/21 0603  WBC 11.6* 13.6* 12.1*  HGB 11.4* 10.9* 10.7*  HCT 37.2* 36.2* 33.7*  PLT 148* 128* 145*  MCV 106.9* 109.4* 105.0*  MCH 32.8 32.9 33.3  MCHC 30.6 30.1 31.8  RDW 16.1* 16.5* 16.1*  LYMPHSABS 1.0 1.6 1.3  MONOABS 1.5* 2.6* 1.3*  EOSABS 0.0 0.1 0.0  BASOSABS 0.0 0.0 0.0    Recent Labs  Lab 07/02/21 0036 07/02/21 0941 07/03/21 0933 07/04/21 0603  NA 140  --  140 136  K 4.2  --  3.8 3.8  CL 99  --  100 100  CO2 29  --  32 30  GLUCOSE 155*  --  94 134*  BUN 29*  --  30* 33*  CREATININE 1.32*  --  1.35* 1.15  CALCIUM 9.0  --  8.6* 8.5*  AST 22  --  25 22  ALT 12  --  13 11  ALKPHOS 64  --  53 56  BILITOT 0.8  --  1.0 0.7  ALBUMIN 3.6  --  3.1* 2.9*  MG  --   --  2.4 2.4  CRP  --  3.8* 9.9* 14.7*  DDIMER  --  3.83* 4.12* 4.26*  PROCALCITON  --  <0.10  --   --     ------------------------------------------------------------------------------------------------------------------ No results for input(s): CHOL, HDL, LDLCALC, TRIG, CHOLHDL, LDLDIRECT in the last 72 hours.  No results found for: HGBA1C ------------------------------------------------------------------------------------------------------------------ No results for input(s): TSH, T4TOTAL, T3FREE, THYROIDAB in the last 72 hours.  Invalid input(s): FREET3  Cardiac Enzymes No results for input(s): CKMB, TROPONINI, MYOGLOBIN in the last 168 hours.  Invalid input(s):  CK ------------------------------------------------------------------------------------------------------------------    Component Value Date/Time   BNP 175.5 (H) 05/05/2021 1612    Radiology Reports CT HEAD WO CONTRAST ( )  Result Date: 07/02/2021 CLINICAL DATA:  Head trauma, mod-severe 87 year old post fall EXAM: CT HEAD WITHOUT CONTRAST TECHNIQUE: Contiguous axial images were obtained  from the base of the skull through the vertex without intravenous contrast. COMPARISON:  Head CT 07/14/2020 FINDINGS: Brain: Stable atrophy and chronic small vessel ischemia. No intracranial hemorrhage, mass effect, or midline shift. No hydrocephalus. The basilar cisterns are patent. No evidence of territorial infarct or acute ischemia. No extra-axial or intracranial fluid collection. Vascular: Atherosclerosis of skullbase vasculature without hyperdense vessel or abnormal calcification. Skull: No fracture or focal lesion. Sinuses/Orbits: Paranasal sinuses and mastoid air cells are clear. The visualized orbits are unremarkable. Bilateral cataract resection. Other: None. IMPRESSION: 1. No acute intracranial abnormality. No skull fracture. 2. Stable atrophy and chronic small vessel ischemia. Electronically Signed   By: Narda Rutherford M.D.   On: 07/02/2021 01:11   CT Cervical Spine Wo Contrast  Result Date: 07/02/2021 CLINICAL DATA:  87 year old post fall. Neck trauma (Age >= 65y) EXAM: CT CERVICAL SPINE WITHOUT CONTRAST TECHNIQUE: Multidetector CT imaging of the cervical spine was performed without intravenous contrast. Multiplanar CT image reconstructions were also generated. COMPARISON:  None. FINDINGS: Alignment: Normal. Skull base and vertebrae: No acute fracture. Vertebral body heights are maintained. The dens and skull base are intact. Soft tissues and spinal canal: No prevertebral fluid or swelling. No visible canal hematoma. Disc levels: Degenerative disc disease is most prominent at C6-C7, to a lesser extent  C5-C6 and C7-T1. Mild scattered facet hypertrophy. Upper chest: No acute or unexpected findings Other: None. IMPRESSION: Degenerative change in the cervical spine without acute fracture or subluxation. Electronically Signed   By: Narda Rutherford M.D.   On: 07/02/2021 01:14   DG Chest Port 1 View  Result Date: 07/03/2021 CLINICAL DATA:  Shortness of breath, COVID EXAM: PORTABLE CHEST 1 VIEW COMPARISON:  Chest radiograph 07/02/2021 FINDINGS: The left chest wall cardiac device and associated leads are unchanged. The cardiomediastinal silhouette is stable. There is no new focal airspace disease. Slight blunting of the left costophrenic angle is unchanged and may reflect a trace residual left pleural effusion. There is no significant right effusion. There is no pneumothorax. There is no acute osseous abnormality. IMPRESSION: Stable chest with no new focal airspace disease. Trace residual left pleural effusion is unchanged. Electronically Signed   By: Lesia Hausen M.D.   On: 07/03/2021 20:19   DG Chest Port 1 View  Result Date: 07/02/2021 CLINICAL DATA:  87 year old post fall. EXAM: PORTABLE CHEST 1 VIEW COMPARISON:  05/17/2021 FINDINGS: Left-sided pacemaker in place. The previous left pneumothorax is no longer seen. Decreased left pleural effusion from prior exam. Trace residual blunting of the costophrenic angle. No new airspace disease. No pulmonary edema. Stable heart size and mediastinal contours. No acute osseous abnormalities are seen. IMPRESSION: 1. No acute chest finding. Previous left pneumothorax is no longer seen. 2. Decreased left pleural effusion from prior exam. Electronically Signed   By: Narda Rutherford M.D.   On: 07/02/2021 01:06

## 2021-07-04 NOTE — Plan of Care (Signed)
Patient progressing 

## 2021-07-04 NOTE — Progress Notes (Signed)
Lower extremity venous has been completed.   Preliminary results in CV Proc.   Aundra Millet Adea Geisel 07/04/2021 2:59 PM

## 2021-07-04 NOTE — Plan of Care (Signed)
  Problem: Education: Goal: Knowledge of General Education information will improve Description: Including pain rating scale, medication(s)/side effects and non-pharmacologic comfort measures Outcome: Progressing   Problem: Clinical Measurements: Goal: Will remain free from infection Outcome: Progressing   

## 2021-07-04 NOTE — Evaluation (Signed)
Physical Therapy Evaluation Patient Details Name: JADA KUHNERT MRN: [id number] DOB: Nov 27, 1918 Today's Date: 07/04/2021  History of Present Illness  ZYLEN WENIG is a 87 y.o. male with medical history significant of afib; BPH; HLD; dementia; and pacemaker placement presenting with a fall.  His DIL reports that he has had URI symptoms the last couple of days - rhinorrhea and cough.  He received his COVID booster 10 days ago.  He was diagnosed with acute COVID-19 infection along with bacterial bronchitis and admitted to the hospital.   Clinical Impression  Patient received in bed, working on word search. He is agreeable to PT session. Able to tell me where he is and the year. Patient is mod independent with bed mobility, transfers with min guard and ambulated in room with QC and min guard. Patient will continue to benefit from skilled PT while here to improve mobility and safety.        Recommendations for follow up therapy are one component of a multi-disciplinary discharge planning process, led by the attending physician.  Recommendations may be updated based on patient status, additional functional criteria and insurance authorization.  Follow Up Recommendations Home health PT    Equipment Recommendations  None recommended by PT    Recommendations for Other Services       Precautions / Restrictions Precautions Precautions: Fall Restrictions Weight Bearing Restrictions: No      Mobility  Bed Mobility Overal bed mobility: Modified Independent                  Transfers Overall transfer level: Needs assistance   Transfers: Sit to/from Stand Sit to Stand: Min guard            Ambulation/Gait Ambulation/Gait assistance: Min guard Gait Distance (Feet): 20 Feet Assistive device: Quad cane Gait Pattern/deviations: Step-through pattern Gait velocity: decr   General Gait Details: patient ambulated to door and back carrying cane. States " Yeah I dont need it.  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)       Balance Overall balance assessment: Needs assistance;History of Falls Sitting-balance support: Feet supported Sitting balance-Leahy Scale: Good     Standing balance support: Single extremity supported;During functional activity Standing balance-Leahy Scale: Fair Standing balance comment: reliant on single UE support and min guard for safety                             Pertinent Vitals/Pain Pain Assessment: No/denies pain    Home Living Family/patient expects to be discharged to:: Private residence Living Arrangements: Children   Type of Home: House Home Access: (P) Stairs to enter Entrance Stairs-Rails: (P) Can reach both Entrance Stairs-Number of Steps: (P) 4 Home Layout: Able to live on main level with bedroom/bathroom;Two level Home Equipment: Walker - 2 wheels;Cane - quad      Prior Function Level of Independence: Independent with assistive device(s)         Comments: Patient reports he has a walker, no family in room to verify home information.     Hand Dominance   Dominant Hand: (P) Right    Extremity/Trunk Assessment   Upper Extremity Assessment Upper Extremity Assessment: Defer to OT evaluation    Lower Extremity Assessment Lower Extremity Assessment: Overall WFL for tasks assessed    Cervical / Trunk Assessment Cervical / Trunk Assessment: Normal  Communication  Communication: HOH  Cognition Arousal/Alertness: Awake/alert Behavior During Therapy: WFL for tasks assessed/performed Overall Cognitive Status: History of cognitive impairments - at baseline                                        General Comments      Exercises     Assessment/Plan    PT Assessment Patient needs continued PT services  PT Problem List Decreased strength;Decreased activity tolerance;Decreased balance;Decreased mobility;Decreased safety  awareness;Decreased cognition       PT Treatment Interventions Therapeutic activities;DME instruction;Gait training;Functional mobility training;Stair training;Therapeutic exercise    PT Goals (Current goals can be found in the Care Plan section)  Acute Rehab PT Goals Patient Stated Goal: to go  home PT Goal Formulation: With patient Time For Goal Achievement: 07/11/21 Potential to Achieve Goals: Good    Frequency Min 3X/week   Barriers to discharge        Co-evaluation               AM-PAC PT "6 Clicks" Mobility  Outcome Measure Help needed turning from your back to your side while in a flat bed without using bedrails?: None Help needed moving from lying on your back to sitting on the side of a flat bed without using bedrails?: A Little Help needed moving to and from a bed to a chair (including a wheelchair)?: A Little Help needed standing up from a chair using your arms (e.g., wheelchair or bedside chair)?: A Little Help needed to walk in hospital room?: A Little Help needed climbing 3-5 steps with a railing? : A Little 6 Click Score: 19    End of Session Equipment Utilized During Treatment: Gait belt Activity Tolerance: Patient tolerated treatment well Patient left: in bed;with call bell/phone within reach;with bed alarm set Nurse Communication: Mobility status PT Visit Diagnosis: Unsteadiness on feet (R26.81);Other abnormalities of gait and mobility (R26.89);History of falling (Z91.81)    Time: 1100-1120 PT Time Calculation (min) (ACUTE ONLY): 20 min   Charges:   PT Evaluation $PT Eval Moderate Complexity: 1 Mod          Cleola Perryman, PT, GCS 07/04/21,11:38 AM

## 2021-07-05 DIAGNOSIS — U071 COVID-19: Secondary | ICD-10-CM | POA: Diagnosis not present

## 2021-07-05 LAB — CBC WITH DIFFERENTIAL/PLATELET
Abs Immature Granulocytes: 0.07 10*3/uL (ref 0.00–0.07)
Basophils Absolute: 0 10*3/uL (ref 0.0–0.1)
Basophils Relative: 0 %
Eosinophils Absolute: 0 10*3/uL (ref 0.0–0.5)
Eosinophils Relative: 0 %
HCT: 34.1 % — ABNORMAL LOW (ref 39.0–52.0)
Hemoglobin: 10.8 g/dL — ABNORMAL LOW (ref 13.0–17.0)
Immature Granulocytes: 1 %
Lymphocytes Relative: 11 %
Lymphs Abs: 1.2 10*3/uL (ref 0.7–4.0)
MCH: 33.2 pg (ref 26.0–34.0)
MCHC: 31.7 g/dL (ref 30.0–36.0)
MCV: 104.9 fL — ABNORMAL HIGH (ref 80.0–100.0)
Monocytes Absolute: 1 10*3/uL (ref 0.1–1.0)
Monocytes Relative: 9 %
Neutro Abs: 9.1 10*3/uL — ABNORMAL HIGH (ref 1.7–7.7)
Neutrophils Relative %: 79 %
Platelets: 152 10*3/uL (ref 150–400)
RBC: 3.25 MIL/uL — ABNORMAL LOW (ref 4.22–5.81)
RDW: 16.1 % — ABNORMAL HIGH (ref 11.5–15.5)
WBC: 11.4 10*3/uL — ABNORMAL HIGH (ref 4.0–10.5)
nRBC: 0 % (ref 0.0–0.2)

## 2021-07-05 LAB — COMPREHENSIVE METABOLIC PANEL
ALT: 20 U/L (ref 0–44)
AST: 35 U/L (ref 15–41)
Albumin: 2.7 g/dL — ABNORMAL LOW (ref 3.5–5.0)
Alkaline Phosphatase: 50 U/L (ref 38–126)
Anion gap: 8 (ref 5–15)
BUN: 29 mg/dL — ABNORMAL HIGH (ref 8–23)
CO2: 29 mmol/L (ref 22–32)
Calcium: 8.4 mg/dL — ABNORMAL LOW (ref 8.9–10.3)
Chloride: 101 mmol/L (ref 98–111)
Creatinine, Ser: 1.15 mg/dL (ref 0.61–1.24)
GFR, Estimated: 57 mL/min — ABNORMAL LOW (ref >60)
Glucose, Bld: 129 mg/dL — ABNORMAL HIGH (ref 70–99)
Potassium: 3.8 mmol/L (ref 3.5–5.1)
Sodium: 138 mmol/L (ref 135–145)
Total Bilirubin: 0.6 mg/dL (ref 0.3–1.2)
Total Protein: 5.7 g/dL — ABNORMAL LOW (ref 6.5–8.1)

## 2021-07-05 LAB — C-REACTIVE PROTEIN: CRP: 9.3 mg/dL — ABNORMAL HIGH (ref <1.0)

## 2021-07-05 LAB — MAGNESIUM: Magnesium: 2.2 mg/dL (ref 1.7–2.4)

## 2021-07-05 LAB — D-DIMER, QUANTITATIVE: D-Dimer, Quant: 3.83 ug/mL-FEU — ABNORMAL HIGH (ref 0.00–0.50)

## 2021-07-05 NOTE — TOC Progression Note (Addendum)
Transition of Care [facility]) - Progression Note    Patient Details  Name: Matthew Howard MRN: [id number] Date of Birth: 1919/06/26  [facility] CM/SW Contact  Zenon Mayo, RN Phone Number: 07/05/2021, 11:38 AM  Clinical Narrative:    NCM left medicare.gov list with daughter n law at the bedside, per Tai, Diplomatic Services operational officer , she states they do not want [place] services they have an aide that comes in the pm. He will be on eliquis, will need to have MD to send to [facility] to fill.  MD states to give patient  1 month coupon, which was given.         Expected Discharge Plan and Services                                                 Social Determinants of Health (SDOH) Interventions    Readmission Risk Interventions No flowsheet data found.

## 2021-07-05 NOTE — Progress Notes (Signed)
Ambulate patient in the room HR sustain in 60-70s, pt. Denies pain , SOB. Will continue to monitor.

## 2021-07-05 NOTE — Progress Notes (Signed)
PROGRESS NOTE                                                                                                                                                                                                             Patient Demographics:    Matthew Howard, is a 87 y.o. male, DOB - [birth year], QDI:[id number]  Outpatient Primary MD for the patient is Matthew Schwalbe, MD    LOS - 2  Admit date - 07/02/2021    Chief Complaint  Patient presents with   Fall       Brief Narrative (HPI from H&P)  Matthew Howard is a 86 y.o. male with medical history significant of afib; BPH; HLD; dementia; and pacemaker placement presenting with a fall.  His DIL reports that he has had URI symptoms the last couple of days - rhinorrhea and cough.  He received his COVID booster 10 days ago.  He was diagnosed with acute COVID-19 infection along with bacterial bronchitis and admitted to the hospital.   Subjective:   Patient in bed, appears comfortable, denies any headache, no fever, no chest pain or pressure, no shortness of breath , no abdominal pain. No focal weakness.   Assessment  & Plan :     Acute COVID-19 infection with ongoing bacterial bronchitis - he has had a productive cough for the last 3 to 4 days, does have mild leukocytosis and some shortness of breath, chest x-ray is nonacute and I think he currently has bronchitis, for which she will be placed on azithromycin, sputum gram stain and culture, since he has been started on remdesivir will complete 3 days.  Will monitor inflammatory markers.  He is fully vaccinated and should not get severe COVID disease.  Encouraged the patient to sit up in chair in the daytime use I-S and flutter valve for pulmonary toiletry.  Will advance activity and titrate down oxygen as possible.   2.  Mechanical fall.  Small laceration back of the scalp, stable, head CT nonacute.  PT OT and monitor.  He  never lost consciousness.  3.  Chronic diastolic CHF.  EF on echocardiogram in 2020.  Currently compensated.  Minimal elevation of troponin likely due to mild demand chest pain.  Nonacute EKG.  Continue home dose aspirin, no further treatment.  4.  History of gout.  Continue  allopurinol.  5.  BPH.  Continue Proscar.  6. High D dimer -negative lower extremity venous duplex, D-dimer likely elevated due to inflammation from COVID-19, will check echocardiogram as well which is pending.  Most likely will discharge him on prophylactic Eliquis for 2 weeks upon discharge,, no tachycardia, no pleuritic chest pain or shortness of breath.       Condition - Guarded  Family Communication  : Daughter bedside 07/03/2021, daughter Matthew Howard [phone] - 07/04/21, daughter in law Matthew Howard [phone] - 07/04/21  Code Status :  Full  Consults  :  None  PUD Prophylaxis :     Procedures  :     Leg [place] - No DVT  CT Head  - Non acute.  TTE      Disposition Plan  :    Status is: Inpt  Dispo: The patient is from: Home              Anticipated d/c is to: Home              Patient currently is not medically stable to d/c.   Difficult to place patient No   DVT Prophylaxis  :    enoxaparin (LOVENOX) injection 40 mg Start: 07/03/21 2123  Lab Results  Component Value Date   PLT 152 07/05/2021    Diet :  Diet Order             Diet regular Room service appropriate? Yes; Fluid consistency: Thin; Fluid restriction: 1500 mL Fluid  Diet effective now                    Inpatient Medications  Scheduled Meds:  allopurinol  200 mg Oral Daily   vitamin C  500 mg Oral Daily   aspirin EC  81 mg Oral Daily   dexamethasone (DECADRON) injection  6 mg Intravenous Q24H   docusate sodium  100 mg Oral BID   donepezil  5 mg Oral Daily   doxycycline  100 mg Oral Q12H   enoxaparin (LOVENOX) injection  40 mg Subcutaneous Q12H   finasteride  5 mg Oral Daily   furosemide  40 mg Oral Daily    melatonin  5 mg Oral QHS   polyethylene glycol  17 g Oral Daily   sodium chloride flush  3 mL Intravenous Q12H   zinc sulfate  220 mg Oral Daily   Continuous Infusions:  remdesivir 100 mg in NS 100 mL 100 mg (07/04/21 0952)   PRN Meds:.acetaminophen, albuterol, bisacodyl, chlorpheniramine-HYDROcodone, guaiFENesin-dextromethorphan, haloperidol lactate, lactulose, [DISCONTINUED] ondansetron **OR** ondansetron (ZOFRAN) IV  Antibiotics  :    Anti-infectives (From admission, onward)    Start     Dose/Rate Route Frequency Ordered Stop   07/04/21 1000  azithromycin (ZITHROMAX) tablet 250 mg  Status:  Discontinued        250 mg Oral Daily 07/03/21 0852 07/03/21 0914   07/03/21 1000  remdesivir 100 mg in sodium chloride 0.9 % 100 mL IVPB       See Hyperspace for full Linked Orders Report.   100 mg 200 mL/hr over 30 Minutes Intravenous Daily 07/02/21 0859     07/03/21 1000  doxycycline (VIBRA-TABS) tablet 100 mg        100 mg Oral Every 12 hours 07/03/21 0914 07/08/21 0959   07/03/21 0900  azithromycin (ZITHROMAX) tablet 500 mg  Status:  Discontinued        500 mg Oral  Once 07/03/21 0851 07/03/21 0914   07/02/21  0900  remdesivir 200 mg in sodium chloride 0.9% 250 mL IVPB       See Hyperspace for full Linked Orders Report.   200 mg 580 mL/hr over 30 Minutes Intravenous Once 07/02/21 0859 07/02/21 1500        Time Spent in minutes  30   Susa Raring M.D on 07/05/2021 at 9:52 AM  To page go to [email or web address]   [facility] -  Office  [phone]    See all Orders from today for further details    Objective:   Vitals:   07/04/21 0600 07/04/21 1100 07/04/21 2100 07/05/21 0525  BP: (!) 131/56 121/69 133/60 (!) 143/62  Pulse: 70 72 69 70  Resp: (!) 23 (!) 21 18 20   Temp:   97.6 F (36.4 C) 97.9 F (36.6 C)  TempSrc:   Oral Oral  SpO2: 96% 96% 96% 97%  Weight:    78.1 kg  Height:        Wt Readings from Last 3 Encounters:  07/05/21 78.1 kg  06/01/21 77.1 kg   05/17/21 78 kg     Intake/Output Summary (Last 24 hours) at 07/05/2021 0952 Last data filed at 07/05/2021 0500 Gross per 24 hour  Intake 680 ml  Output 1600 ml  Net -920 ml     Physical Exam  Awake Alert, No new F.N deficits, small laceration on the right side of the scalp - stable .AT,PERRAL Supple Neck,No JVD, No cervical lymphadenopathy appriciated.  Symmetrical Chest wall movement, Good air movement bilaterally, CTAB RRR,No Gallops, Rubs or new Murmurs, No Parasternal Heave +ve B.Sounds, Abd Soft, No tenderness, No organomegaly appriciated, No rebound - guarding or rigidity. No Cyanosis, Clubbing or edema, No new Rash or bruise     Data Review:    CBC Recent Labs  Lab 07/02/21 0036 07/03/21 0933 07/04/21 0603 07/05/21 0343  WBC 11.6* 13.6* 12.1* 11.4*  HGB 11.4* 10.9* 10.7* 10.8*  HCT 37.2* 36.2* 33.7* 34.1*  PLT 148* 128* 145* 152  MCV 106.9* 109.4* 105.0* 104.9*  MCH 32.8 32.9 33.3 33.2  MCHC 30.6 30.1 31.8 31.7  RDW 16.1* 16.5* 16.1* 16.1*  LYMPHSABS 1.0 1.6 1.3 1.2  MONOABS 1.5* 2.6* 1.3* 1.0  EOSABS 0.0 0.1 0.0 0.0  BASOSABS 0.0 0.0 0.0 0.0    Recent Labs  Lab 07/02/21 0036 07/02/21 0941 07/03/21 0933 07/04/21 0603 07/05/21 0343  NA 140  --  140 136 138  K 4.2  --  3.8 3.8 3.8  CL 99  --  100 100 101  CO2 29  --  32 30 29  GLUCOSE 155*  --  94 134* 129*  BUN 29*  --  30* 33* 29*  CREATININE 1.32*  --  1.35* 1.15 1.15  CALCIUM 9.0  --  8.6* 8.5* 8.4*  AST 22  --  25 22 35  ALT 12  --  13 11 20   ALKPHOS 64  --  53 56 50  BILITOT 0.8  --  1.0 0.7 0.6  ALBUMIN 3.6  --  3.1* 2.9* 2.7*  MG  --   --  2.4 2.4 2.2  CRP  --  3.8* 9.9* 14.7* 9.3*  DDIMER  --  3.83* 4.12* 4.26* 3.83*  PROCALCITON  --  <0.10  --   --   --     ------------------------------------------------------------------------------------------------------------------ No results for input(s): CHOL, HDL, LDLCALC, TRIG, CHOLHDL, LDLDIRECT in the last 72 hours.  No results  found for: HGBA1C ------------------------------------------------------------------------------------------------------------------ No results for input(s):  TSH, T4TOTAL, T3FREE, THYROIDAB in the last 72 hours.  Invalid input(s): FREET3  Cardiac Enzymes No results for input(s): CKMB, TROPONINI, MYOGLOBIN in the last 168 hours.  Invalid input(s): CK ------------------------------------------------------------------------------------------------------------------    Component Value Date/Time   BNP 175.5 (H) 05/05/2021 1612    Radiology Reports CT HEAD WO CONTRAST ( )  Result Date: 07/02/2021 CLINICAL DATA:  Head trauma, mod-severe 87 year old post fall EXAM: CT HEAD WITHOUT CONTRAST TECHNIQUE: Contiguous axial images were obtained from the base of the skull through the vertex without intravenous contrast. COMPARISON:  Head CT 07/14/2020 FINDINGS: Brain: Stable atrophy and chronic small vessel ischemia. No intracranial hemorrhage, mass effect, or midline shift. No hydrocephalus. The basilar cisterns are patent. No evidence of territorial infarct or acute ischemia. No extra-axial or intracranial fluid collection. Vascular: Atherosclerosis of skullbase vasculature without hyperdense vessel or abnormal calcification. Skull: No fracture or focal lesion. Sinuses/Orbits: Paranasal sinuses and mastoid air cells are clear. The visualized orbits are unremarkable. Bilateral cataract resection. Other: None. IMPRESSION: 1. No acute intracranial abnormality. No skull fracture. 2. Stable atrophy and chronic small vessel ischemia. Electronically Signed   By: Narda Rutherford M.D.   On: 07/02/2021 01:11   CT Cervical Spine Wo Contrast  Result Date: 07/02/2021 CLINICAL DATA:  87 year old post fall. Neck trauma (Age >= 65y) EXAM: CT CERVICAL SPINE WITHOUT CONTRAST TECHNIQUE: Multidetector CT imaging of the cervical spine was performed without intravenous contrast. Multiplanar CT image reconstructions were also  generated. COMPARISON:  None. FINDINGS: Alignment: Normal. Skull base and vertebrae: No acute fracture. Vertebral body heights are maintained. The dens and skull base are intact. Soft tissues and spinal canal: No prevertebral fluid or swelling. No visible canal hematoma. Disc levels: Degenerative disc disease is most prominent at C6-C7, to a lesser extent C5-C6 and C7-T1. Mild scattered facet hypertrophy. Upper chest: No acute or unexpected findings Other: None. IMPRESSION: Degenerative change in the cervical spine without acute fracture or subluxation. Electronically Signed   By: Narda Rutherford M.D.   On: 07/02/2021 01:14   DG Chest Port 1 View  Result Date: 07/03/2021 CLINICAL DATA:  Shortness of breath, COVID EXAM: PORTABLE CHEST 1 VIEW COMPARISON:  Chest radiograph 07/02/2021 FINDINGS: The left chest wall cardiac device and associated leads are unchanged. The cardiomediastinal silhouette is stable. There is no new focal airspace disease. Slight blunting of the left costophrenic angle is unchanged and may reflect a trace residual left pleural effusion. There is no significant right effusion. There is no pneumothorax. There is no acute osseous abnormality. IMPRESSION: Stable chest with no new focal airspace disease. Trace residual left pleural effusion is unchanged. Electronically Signed   By: Lesia Hausen M.D.   On: 07/03/2021 20:19   DG Chest Port 1 View  Result Date: 07/02/2021 CLINICAL DATA:  87 year old post fall. EXAM: PORTABLE CHEST 1 VIEW COMPARISON:  05/17/2021 FINDINGS: Left-sided pacemaker in place. The previous left pneumothorax is no longer seen. Decreased left pleural effusion from prior exam. Trace residual blunting of the costophrenic angle. No new airspace disease. No pulmonary edema. Stable heart size and mediastinal contours. No acute osseous abnormalities are seen. IMPRESSION: 1. No acute chest finding. Previous left pneumothorax is no longer seen. 2. Decreased left pleural effusion  from prior exam. Electronically Signed   By: Narda Rutherford M.D.   On: 07/02/2021 01:06   VAS [place] LOWER EXTREMITY VENOUS (DVT)  Result Date: 07/04/2021  Lower Venous DVT Study Patient Name:  Matthew Howard  Date of Exam:   07/04/2021 Medical  Rec #: [id number]         Accession #:    [id number] Date of Birth: 05-21-1919        Patient Gender: M Patient Age:   21 years Exam Location:  [facility] Procedure:      VAS [place] LOWER EXTREMITY VENOUS (DVT) Referring Phys: Bess Harvest [facility] --------------------------------------------------------------------------------  Indications: Rapidly rising D-dimer.  Comparison Study: 09/06/20 prior Performing Technologist: [place] Ponder RVS  Examination Guidelines: A complete evaluation includes B-mode imaging, spectral Doppler, color Doppler, and power Doppler as needed of all accessible portions of each vessel. Bilateral testing is considered an integral part of a complete examination. Limited examinations for reoccurring indications may be performed as noted. The reflux portion of the exam is performed with the patient in reverse Trendelenburg.  +---------+---------------+---------+-----------+----------+--------------+ RIGHT    CompressibilityPhasicitySpontaneityPropertiesThrombus Aging +---------+---------------+---------+-----------+----------+--------------+ CFV      Full           Yes      Yes                                 +---------+---------------+---------+-----------+----------+--------------+ SFJ      Full                                                        +---------+---------------+---------+-----------+----------+--------------+ FV Prox  Full                                                        +---------+---------------+---------+-----------+----------+--------------+ FV Mid   Full                                                        +---------+---------------+---------+-----------+----------+--------------+ FV  DistalFull                                                        +---------+---------------+---------+-----------+----------+--------------+ PFV      Full                                                        +---------+---------------+---------+-----------+----------+--------------+ POP      Full           Yes      Yes                                 +---------+---------------+---------+-----------+----------+--------------+ PTV      Full                                                        +---------+---------------+---------+-----------+----------+--------------+  PERO     Full                                                        +---------+---------------+---------+-----------+----------+--------------+   +---------+---------------+---------+-----------+----------+-------------------+ LEFT     CompressibilityPhasicitySpontaneityPropertiesThrombus Aging      +---------+---------------+---------+-----------+----------+-------------------+ CFV      Full           Yes      Yes                                      +---------+---------------+---------+-----------+----------+-------------------+ SFJ      Full                                                             +---------+---------------+---------+-----------+----------+-------------------+ FV Prox  Full                                                             +---------+---------------+---------+-----------+----------+-------------------+ FV Mid   Full                                                             +---------+---------------+---------+-----------+----------+-------------------+ FV DistalFull                                                             +---------+---------------+---------+-----------+----------+-------------------+ PFV      Full                                                              +---------+---------------+---------+-----------+----------+-------------------+ POP      Full           Yes      Yes                                      +---------+---------------+---------+-----------+----------+-------------------+ PTV      Full                                                             +---------+---------------+---------+-----------+----------+-------------------+  PERO                                                  Not well visualized +---------+---------------+---------+-----------+----------+-------------------+     Summary: BILATERAL: - No evidence of deep vein thrombosis seen in the lower extremities, bilaterally. -No evidence of popliteal cyst, bilaterally.   *See table(s) above for measurements and observations. Electronically signed by Waverly Ferrari MD on 07/04/2021 at 3:43:28 PM.    Final

## 2021-07-05 NOTE — Progress Notes (Signed)
SATURATION QUALIFICATIONS: (This note is used to comply with regulatory documentation for home oxygen)  Patient Saturations on Room Air at Rest =99%  Patient Saturations on Room Air while Ambulating =96%  Patient Saturations on 0 Liters of oxygen while Ambulating =96%  Please briefly explain why patient needs home oxygen: 

## 2021-07-06 ENCOUNTER — Inpatient Hospital Stay (HOSPITAL_COMMUNITY): Payer: Medicare Other

## 2021-07-06 ENCOUNTER — Other Ambulatory Visit (HOSPITAL_COMMUNITY): Payer: Self-pay

## 2021-07-06 DIAGNOSIS — U071 COVID-19: Secondary | ICD-10-CM | POA: Diagnosis not present

## 2021-07-06 DIAGNOSIS — I5031 Acute diastolic (congestive) heart failure: Secondary | ICD-10-CM

## 2021-07-06 LAB — COMPREHENSIVE METABOLIC PANEL
ALT: 75 U/L — ABNORMAL HIGH (ref 0–44)
AST: 89 U/L — ABNORMAL HIGH (ref 15–41)
Albumin: 2.9 g/dL — ABNORMAL LOW (ref 3.5–5.0)
Alkaline Phosphatase: 58 U/L (ref 38–126)
Anion gap: 12 (ref 5–15)
BUN: 29 mg/dL — ABNORMAL HIGH (ref 8–23)
CO2: 27 mmol/L (ref 22–32)
Calcium: 8.7 mg/dL — ABNORMAL LOW (ref 8.9–10.3)
Chloride: 101 mmol/L (ref 98–111)
Creatinine, Ser: 1.19 mg/dL (ref 0.61–1.24)
GFR, Estimated: 55 mL/min — ABNORMAL LOW (ref >60)
Glucose, Bld: 115 mg/dL — ABNORMAL HIGH (ref 70–99)
Potassium: 4.4 mmol/L (ref 3.5–5.1)
Sodium: 140 mmol/L (ref 135–145)
Total Bilirubin: 0.8 mg/dL (ref 0.3–1.2)
Total Protein: 5.8 g/dL — ABNORMAL LOW (ref 6.5–8.1)

## 2021-07-06 LAB — CBC WITH DIFFERENTIAL/PLATELET
Abs Immature Granulocytes: 0.05 10*3/uL (ref 0.00–0.07)
Basophils Absolute: 0 10*3/uL (ref 0.0–0.1)
Basophils Relative: 0 %
Eosinophils Absolute: 0 10*3/uL (ref 0.0–0.5)
Eosinophils Relative: 0 %
HCT: 34.8 % — ABNORMAL LOW (ref 39.0–52.0)
Hemoglobin: 10.9 g/dL — ABNORMAL LOW (ref 13.0–17.0)
Immature Granulocytes: 1 %
Lymphocytes Relative: 12 %
Lymphs Abs: 1.3 10*3/uL (ref 0.7–4.0)
MCH: 33.2 pg (ref 26.0–34.0)
MCHC: 31.3 g/dL (ref 30.0–36.0)
MCV: 106.1 fL — ABNORMAL HIGH (ref 80.0–100.0)
Monocytes Absolute: 0.8 10*3/uL (ref 0.1–1.0)
Monocytes Relative: 7 %
Neutro Abs: 8.3 10*3/uL — ABNORMAL HIGH (ref 1.7–7.7)
Neutrophils Relative %: 80 %
Platelets: 161 10*3/uL (ref 150–400)
RBC: 3.28 MIL/uL — ABNORMAL LOW (ref 4.22–5.81)
RDW: 16.3 % — ABNORMAL HIGH (ref 11.5–15.5)
WBC: 10.4 10*3/uL (ref 4.0–10.5)
nRBC: 0 % (ref 0.0–0.2)

## 2021-07-06 LAB — C-REACTIVE PROTEIN: CRP: 6.3 mg/dL — ABNORMAL HIGH (ref <1.0)

## 2021-07-06 LAB — ECHOCARDIOGRAM LIMITED
Area-P 1/2: 4.01 cm2
Height: 71 in
Weight: 2737.23 oz

## 2021-07-06 LAB — D-DIMER, QUANTITATIVE: D-Dimer, Quant: 3.6 ug/mL-FEU — ABNORMAL HIGH (ref 0.00–0.50)

## 2021-07-06 LAB — MAGNESIUM: Magnesium: 2 mg/dL (ref 1.7–2.4)

## 2021-07-06 MED ORDER — APIXABAN 2.5 MG PO TABS
2.5000 mg | ORAL_TABLET | Freq: Two times a day (BID) | ORAL | 0 refills | Status: DC
  Filled 2021-07-06: qty 14, 7d supply, fill #0

## 2021-07-06 MED ORDER — ASPIRIN 81 MG PO TBEC: 81.0000 mg | DELAYED_RELEASE_TABLET | Freq: Every morning | ORAL | 12 refills | Status: AC

## 2021-07-06 MED ORDER — DOXYCYCLINE HYCLATE 100 MG PO TABS
100.0000 mg | ORAL_TABLET | Freq: Two times a day (BID) | ORAL | 0 refills | Status: DC
  Filled 2021-07-06: qty 6, 3d supply, fill #0

## 2021-07-06 MED ORDER — APIXABAN 2.5 MG PO TABS
2.5000 mg | ORAL_TABLET | Freq: Two times a day (BID) | ORAL | Status: DC
  Administered 2021-07-06: 2.5 mg via ORAL
  Filled 2021-07-06: qty 1

## 2021-07-06 NOTE — Progress Notes (Signed)
Physical Therapy Treatment Patient Details Name: Matthew Howard MRN: [id number] DOB: [birth year] Today's Date: 07/06/2021   History of Present Illness Matthew Howard is a 87 y.o. male with medical history significant of afib; BPH; HLD; dementia; and pacemaker placement presenting with a fall.  His DIL reports that he has had URI symptoms the last couple of days - rhinorrhea and cough.  He received his COVID booster 10 days ago.  He was diagnosed with acute COVID-19 infection along with bacterial bronchitis and admitted to the hospital.    PT Comments    Pt with trunk sway causing x3 bouts of LOB needing up to minA to recover this date while ambulating with a RW within the room or negotiating stairs with 1 UE support. Educated pt and family on pt being at risk for falls, safe guarding with mobility, and use of arms to control descent with stand > sit transfers for safety purposes. Will continue to follow acutely. Current recommendations remain appropriate.   Recommendations for follow up therapy are one component of a multi-disciplinary discharge planning process, led by the attending physician.  Recommendations may be updated based on patient status, additional functional criteria and insurance authorization.  Follow Up Recommendations  Home health PT     Equipment Recommendations  None recommended by PT    Recommendations for Other Services       Precautions / Restrictions Precautions Precautions: Fall Precaution Comments: COVID precautions; HOH (hearing aides) Restrictions Weight Bearing Restrictions: No     Mobility  Bed Mobility Overal bed mobility: Modified Independent Bed Mobility: Supine to Sit     Supine to sit: HOB elevated;Modified independent (Device/Increase time)     General bed mobility comments: Pt able to come to sit without assistance using bed rails and controls.    Transfers Overall transfer level: Needs assistance Equipment used: Rolling walker (2  wheeled) Transfers: Sit to/from Stand Sit to Stand: Min guard;Min assist         General transfer comment: Pt needing minA to prevent posterior LOB upon 1st transfer to stand as he had a posterior sway, but min guard for subsequent reps, 1x from toilet with grab bars and 6x from recliner. Educated pt on controlling descent with use of UEs on arm rests of chairs.  Ambulation/Gait Ambulation/Gait assistance: Min guard;Min assist Gait Distance (Feet): 15 Feet (x2 bouts of ~15 ft each) Assistive device: Rolling walker (2 wheeled) Gait Pattern/deviations: Step-through pattern;Decreased stride length;Trunk flexed Gait velocity: decr Gait velocity interpretation: <1.31 ft/sec, indicative of household ambulator General Gait Details: Pt with slow, mildly unsteady gait with x1 LOB needing minA to recover.   Stairs Stairs: Yes Stairs assistance: Min assist;Min guard Stair Management: With walker;Step to pattern;Forwards Number of Stairs: 6 General stair comments: Step brought to room since pt is on COVID precautions. Used RW lateral to step as support to simulate handrail on one side. x2 LOB/trunk sway needing minA to maintain balance, but otherwise unsteadiness noted but min guard assist only for ascending and descending.   Wheelchair Mobility    Modified Rankin (Stroke Patients Only)       Balance Overall balance assessment: Needs assistance;History of Falls Sitting-balance support: Feet supported Sitting balance-Leahy Scale: Good     Standing balance support: Bilateral upper extremity supported;Single extremity supported;No upper extremity supported;During functional activity Standing balance-Leahy Scale: Fair Standing balance comment: Able to stand statically without UE support but benefits from at least 1 UE support and up to minA intermittently to maintain balance  with dynamic tasks/mobility.                            Cognition Arousal/Alertness:  Awake/alert Behavior During Therapy: WFL for tasks assessed/performed Overall Cognitive Status: History of cognitive impairments - at baseline                                 General Comments: Daughter reporting hx of cognitive deficits. Pt appears to have poor insight into deficits impacting his safety, desiring to be more independent. Educated pt on need to allow some assistance and guarding from family to ensure his safety, pt agreeable during session of plan made.      Exercises      General Comments General comments (skin integrity, edema, etc.): Educated pt to have someone guard/assist him, particularly when outside or on stairs for safety purposes, and to control descent to chairs with UEs.      Pertinent Vitals/Pain Pain Assessment: Faces Faces Pain Scale: No hurt Pain Intervention(s): Monitored during session    Home Living                      Prior Function            PT Goals (current goals can now be found in the care plan section) Acute Rehab PT Goals Patient Stated Goal: to go home PT Goal Formulation: With patient/family Time For Goal Achievement: 07/11/21 Potential to Achieve Goals: Good Progress towards PT goals: Progressing toward goals    Frequency    Min 3X/week      PT Plan Current plan remains appropriate    Co-evaluation              AM-PAC PT "6 Clicks" Mobility   Outcome Measure  Help needed turning from your back to your side while in a flat bed without using bedrails?: None Help needed moving from lying on your back to sitting on the side of a flat bed without using bedrails?: None Help needed moving to and from a bed to a chair (including a wheelchair)?: A Little Help needed standing up from a chair using your arms (e.g., wheelchair or bedside chair)?: A Little Help needed to walk in hospital room?: A Little Help needed climbing 3-5 steps with a railing? : A Little 6 Click Score: 20    End of Session    Activity Tolerance: Patient tolerated treatment well Patient left: in chair;with call bell/phone within reach;with family/visitor present (educated daughter on how to turn on chair alarm once done bathing pt in chair) Nurse Communication: Mobility status PT Visit Diagnosis: Unsteadiness on feet (R26.81);Other abnormalities of gait and mobility (R26.89);History of falling (Z91.81);Difficulty in walking, not elsewhere classified (R26.2)     Time: 3151-7616 PT Time Calculation (min) (ACUTE ONLY): 40 min  Charges:  $Gait Training: 23-37 mins $Therapeutic Activity: 8-22 mins                     Raymond Gurney, PT, DPT Acute Rehabilitation Services  Pager: [phone] Office: [phone]    Jewel Baize 07/06/2021, 11:28 AM

## 2021-07-06 NOTE — TOC Transition Note (Signed)
Transition of Care [facility]) - CM/SW Discharge Note   Patient Details  Name: Matthew Howard MRN: [id number] Date of Birth: 13-Sep-1919  [facility] CM/SW Contact:  Zenon Mayo, RN Phone Number: 07/06/2021, 8:41 AM   Clinical Narrative:    NCM left medicare.gov list with daughter n law at the bedside, per Tai, Diplomatic Services operational officer , she states they do not want [place] services they have an aide that comes in the pm. He will be on eliquis, will need to have MD to send to [facility] to fill.  MD states to give patient  1 month coupon, which was given.      Final next level of care: Home/Self Care Barriers to Discharge: No Barriers Identified   Patient Goals and CMS Choice Patient states their goals for this hospitalization and ongoing recovery are:: return home      Discharge Placement                       Discharge Plan and Services                  DME Agency: NA       HH Arranged: NA          Social Determinants of Health (SDOH) Interventions     Readmission Risk Interventions No flowsheet data found.

## 2021-07-06 NOTE — Discharge Instructions (Signed)
Follow with Primary MD [name], MD in 7 days   Get CBC, CMP, 2 view Chest X ray -  checked next visit within 1 week by Primary MD    Activity: As tolerated with Full fall precautions use walker/cane & assistance as needed  Disposition Home    Diet: Heart Healthy with strict 1.5 L/day total fluid restriction  Check your Weight same time everyday, if you gain over 2 pounds, or you develop in leg swelling, experience more shortness of breath or chest pain, call your Primary MD immediately. Follow Cardiac Low Salt Diet and 1.5 lit/day fluid restriction.  Special Instructions: If you have smoked or chewed Tobacco  in the last 2 yrs please stop smoking, stop any regular Alcohol  and or any Recreational drug use.  On your next visit with your primary care physician please Get Medicines reviewed and adjusted.  Please request your Prim.MD to go over all Hospital Tests and Procedure/Radiological results at the follow up, please get all Hospital records sent to your Prim MD by signing hospital release before you go home.  If you experience worsening of your admission symptoms, develop shortness of breath, life threatening emergency, suicidal or homicidal thoughts you must seek medical attention immediately by calling 911 or calling your MD immediately  if symptoms less severe.  You Must read complete instructions/literature along with all the possible adverse reactions/side effects for all the Medicines you take and that have been prescribed to you. Take any new Medicines after you have completely understood and accpet all the possible adverse reactions/side effects.       Person Under Monitoring Name: Matthew Howard  Location: [address] [place] [address]   Infection Prevention Recommendations for Individuals Confirmed to have, or Being Evaluated for, 2019 Novel Coronavirus (COVID-19) Infection Who Receive Care at Home  Individuals who are confirmed to have, or are  being evaluated for, COVID-19 should follow the prevention steps below until a healthcare provider or local or state health department says they can return to normal activities.  Stay home except to get medical care You should restrict activities outside your home, except for getting medical care. Do not go to work, school, or public areas, and do not use public transportation or taxis.  Call ahead before visiting your doctor Before your medical appointment, call the healthcare provider and tell them that you have, or are being evaluated for, COVID-19 infection. This will help the healthcare provider's office take steps to keep other people from getting infected. Ask your healthcare provider to call the local or state health department.  Monitor your symptoms Seek prompt medical attention if your illness is worsening (e.g., difficulty breathing). Before going to your medical appointment, call the healthcare provider and tell them that you have, or are being evaluated for, COVID-19 infection. Ask your healthcare provider to call the local or state health department.  Wear a facemask You should wear a facemask that covers your nose and mouth when you are in the same room with other people and when you visit a healthcare provider. People who live with or visit you should also wear a facemask while they are in the same room with you.  Separate yourself from other people in your home As much as possible, you should stay in a different room from other people in your home. Also, you should use a separate bathroom, if available.  Avoid sharing household items You should not share dishes, drinking glasses, cups, eating  utensils, towels, bedding, or other items with other people in your home. After using these items, you should wash them thoroughly with soap and water.  Cover your coughs and sneezes Cover your mouth and nose with a tissue when you cough or sneeze, or you can cough or sneeze into  your sleeve. Throw used tissues in a lined trash can, and immediately wash your hands with soap and water for at least 20 seconds or use an alcohol-based hand rub.  Wash your [employer] your hands often and thoroughly with soap and water for at least 20 seconds. You can use an alcohol-based hand sanitizer if soap and water are not available and if your hands are not visibly dirty. Avoid touching your eyes, nose, and mouth with unwashed hands.   Prevention Steps for Caregivers and Household Members of Individuals Confirmed to have, or Being Evaluated for, COVID-19 Infection Being Cared for in the Home  If you live with, or provide care at home for, a person confirmed to have, or being evaluated for, COVID-19 infection please follow these guidelines to prevent infection:  Follow healthcare provider's instructions Make sure that you understand and can help the patient follow any healthcare provider instructions for all care.  Provide for the patient's basic needs You should help the patient with basic needs in the home and provide support for getting groceries, prescriptions, and other personal needs.  Monitor the patient's symptoms If they are getting sicker, call his or her medical provider and tell them that the patient has, or is being evaluated for, COVID-19 infection. This will help the healthcare provider's office take steps to keep other people from getting infected. Ask the healthcare provider to call the local or state health department.  Limit the number of people who have contact with the patient If possible, have only one caregiver for the patient. Other household members should stay in another home or place of residence. If this is not possible, they should stay in another room, or be separated from the patient as much as possible. Use a separate bathroom, if available. Restrict visitors who do not have an essential need to be in the home.  Keep older adults, very young  children, and other sick people away from the patient Keep older adults, very young children, and those who have compromised immune systems or chronic health conditions away from the patient. This includes people with chronic heart, lung, or kidney conditions, diabetes, and cancer.  Ensure good ventilation Make sure that shared spaces in the home have good air flow, such as from an air conditioner or an opened window, weather permitting.  Wash your hands often Wash your hands often and thoroughly with soap and water for at least 20 seconds. You can use an alcohol based hand sanitizer if soap and water are not available and if your hands are not visibly dirty. Avoid touching your eyes, nose, and mouth with unwashed hands. Use disposable paper towels to dry your hands. If not available, use dedicated cloth towels and replace them when they become wet.  Wear a facemask and gloves Wear a disposable facemask at all times in the room and gloves when you touch or have contact with the patient's blood, body fluids, and/or secretions or excretions, such as sweat, saliva, sputum, nasal mucus, vomit, urine, or feces.  Ensure the mask fits over your nose and mouth tightly, and do not touch it during use. Throw out disposable facemasks and gloves after using them. Do not reuse.  Wash your hands immediately after removing your facemask and gloves. If your personal clothing becomes contaminated, carefully remove clothing and launder. Wash your hands after handling contaminated clothing. Place all used disposable facemasks, gloves, and other waste in a lined container before disposing them with other household waste. Remove gloves and wash your hands immediately after handling these items.  Do not share dishes, glasses, or other household items with the patient Avoid sharing household items. You should not share dishes, drinking glasses, cups, eating utensils, towels, bedding, or other items with a patient who  is confirmed to have, or being evaluated for, COVID-19 infection. After the person uses these items, you should wash them thoroughly with soap and water.  Wash laundry thoroughly Immediately remove and wash clothes or bedding that have blood, body fluids, and/or secretions or excretions, such as sweat, saliva, sputum, nasal mucus, vomit, urine, or feces, on them. Wear gloves when handling laundry from the patient. Read and follow directions on labels of laundry or clothing items and detergent. In general, wash and dry with the warmest temperatures recommended on the label.  Clean all areas the individual has used often Clean all touchable surfaces, such as counters, tabletops, doorknobs, bathroom fixtures, toilets, phones, keyboards, tablets, and bedside tables, every day. Also, clean any surfaces that may have blood, body fluids, and/or secretions or excretions on them. Wear gloves when cleaning surfaces the patient has come in contact with. Use a diluted bleach solution (e.g., dilute bleach with 1 part bleach and 10 parts water) or a household disinfectant with a label that says EPA-registered for coronaviruses. To make a bleach solution at home, add 1 tablespoon of bleach to 1 quart (4 cups) of water. For a larger supply, add  cup of bleach to 1 gallon (16 cups) of water. Read labels of cleaning products and follow recommendations provided on product labels. Labels contain instructions for safe and effective use of the cleaning product including precautions you should take when applying the product, such as wearing gloves or eye protection and making sure you have good ventilation during use of the product. Remove gloves and wash hands immediately after cleaning.  Monitor yourself for signs and symptoms of illness Caregivers and household members are considered close contacts, should monitor their health, and will be asked to limit movement outside of the home to the extent possible. Follow the  monitoring steps for close contacts listed on the symptom monitoring form.   ? If you have additional questions, contact your local health department or call the epidemiologist on call at [phone] (available 24/7). ? This guidance is subject to change. For the most up-to-date guidance from [facility], please refer to their website: [email or web address]

## 2021-07-06 NOTE — Discharge Summary (Signed)
Matthew Howard ZOX:[id number] DOB: Oct 06, 1919 DOA: 07/02/2021  PCP: [name], MD  Admit date: 07/02/2021  Discharge date: 07/06/2021  Admitted From: Home  Disposition:  Home   Recommendations for Outpatient Follow-up:   Follow up with PCP in 1-2 weeks  PCP Please obtain BMP/CBC, 2 view CXR in 1week,  (see Discharge instructions)   PCP Please follow up on the following pending results:    Home Health: PT   Equipment/Devices: None  Consultations: None  Discharge Condition: Stable    CODE STATUS: Full    Diet Recommendation:    Diet Order             Diet regular Room service appropriate? Yes; Fluid consistency: Thin; Fluid restriction: 1500 mL Fluid  Diet effective now                    Chief Complaint  Patient presents with   Fall     Brief history of present illness from the day of admission and additional interim summary    Matthew Howard is a 87 y.o. male with medical history significant of afib; BPH; HLD; dementia; and pacemaker placement presenting with a fall.  His DIL reports that he has had URI symptoms the last couple of days - rhinorrhea and cough.  He received his COVID booster 10 days ago.  He was diagnosed with acute COVID-19 infection along with bacterial bronchitis and admitted to the hospital.                                                                 Hospital Course    Acute COVID-19 infection with ongoing bacterial bronchitis - he has had a productive cough for the last 3 to 4 days, does have mild leukocytosis and some shortness of breath, chest x-ray is nonacute and I think he had bronchitis from Pulmonary standpoint, for which he got 7 day course of Doxy, also 3 days of Remdesivir & short course of Decadron.  He is fully vaccinated x 5.    2.  Mechanical  fall.  Small laceration back of the scalp, stable, head CT nonacute.  PT OT and HHPT.  He never lost consciousness.   3.  Chronic diastolic CHF.  EF on echocardiogram in 2020.  Currently compensated.  Minimal elevation of troponin likely due to mild demand chest pain.  Nonacute EKG.  Continue home dose aspirin, no further treatment.   4.  History of gout.  Continue allopurinol.   5.  BPH.  Continue Proscar.   6. High D dimer -negative lower extremity venous duplex, D-dimer likely elevated due to inflammation from COVID-19, TTE - stable with reduced PA pressure as compared to previous, DW Dr [place] Dragon.  Will discharge him on prophylactic Eliquis for 2 weeks (  skip ASA 2 weeks) , no tachycardia, no pleuritic chest pain or shortness of breath, stable and symptom free on RA.   Discharge diagnosis     Principal Problem:   COVID-19 Active Problems:   Benign prostatic hyperplasia (BPH) with urinary urgency   Pacemaker   Vascular dementia, uncomplicated (HCC)   Chronic diastolic heart failure (HCC)   Falls frequently   Stage 3a chronic kidney disease (HCC)   Elevated troponin    Discharge instructions    Discharge Instructions     Discharge instructions   Complete by: As directed    Follow with Primary MD [name], MD in 7 days   Get CBC, CMP, 2 view Chest X ray -  checked next visit within 1 week by Primary MD    Activity: As tolerated with Full fall precautions use walker/cane & assistance as needed  Disposition Home    Diet: Heart Healthy with strict 1.5 L/day total fluid restriction  Check your Weight same time everyday, if you gain over 2 pounds, or you develop in leg swelling, experience more shortness of breath or chest pain, call your Primary MD immediately. Follow Cardiac Low Salt Diet and 1.5 lit/day fluid restriction.  Special Instructions: If you have smoked or chewed Tobacco  in the last 2 yrs please stop smoking, stop any regular Alcohol  and or any  Recreational drug use.  On your next visit with your primary care physician please Get Medicines reviewed and adjusted.  Please request your Prim.MD to go over all Hospital Tests and Procedure/Radiological results at the follow up, please get all Hospital records sent to your Prim MD by signing hospital release before you go home.  If you experience worsening of your admission symptoms, develop shortness of breath, life threatening emergency, suicidal or homicidal thoughts you must seek medical attention immediately by calling 911 or calling your MD immediately  if symptoms less severe.  You Must read complete instructions/literature along with all the possible adverse reactions/side effects for all the Medicines you take and that have been prescribed to you. Take any new Medicines after you have completely understood and accpet all the possible adverse reactions/side effects.   Increase activity slowly   Complete by: As directed    MyChart COVID-19 home monitoring program   Complete by: Jul 06, 2021    Is the patient willing to use the MyChart Mobile App for home monitoring?: Yes   Temperature monitoring   Complete by: Jul 06, 2021    After how many days would you like to receive a notification of this patient's flowsheet entries?: 1       Discharge Medications   Allergies as of 07/06/2021       Reactions   Tape Other (See Comments)   THE PATIENT'S SKIN IS VERY DELICATE- WILL TEAR!!        Medication List     TAKE these medications    albuterol 108 (90 Base) MCG/ACT inhaler Commonly known as: VENTOLIN HFA Inhale 2 puffs into the lungs every 4 (four) hours as needed for wheezing or shortness of breath.   allopurinol 100 MG tablet Commonly known as: ZYLOPRIM TAKE 2 TABLETS BY MOUTH EVERY DAY What changed: when to take this   aspirin 81 MG EC tablet Take 1 tablet (81 mg total) by mouth in the morning. Start taking on: July 13, 2021 What changed: These instructions  start on July 13, 2021. If you are unsure what to do until then, ask  your doctor or other care provider.   colchicine 0.6 MG tablet TAKE 1 TABLET (0.6 MG TOTAL) BY MOUTH 2 (TWO) TIMES DAILY AS NEEDED. What changed: reasons to take this   Cyanocobalamin 2500 MCG Tabs Take 2,500 mcg by mouth daily.   donepezil 5 MG tablet Commonly known as: ARICEPT Take 5 mg by mouth in the morning.   doxycycline 100 MG tablet Commonly known as: VIBRA-TABS Take 1 tablet (100 mg total) by mouth every 12 (twelve) hours.   Eliquis 2.5 MG Tabs tablet Generic drug: apixaban Take 1 tablet (2.5 mg total) by mouth 2 (two) times daily.   finasteride 5 MG tablet Commonly known as: PROSCAR TAKE 1 TABLET BY MOUTH DAILY What changed: when to take this   furosemide 80 MG tablet Commonly known as: LASIX Take 1 tablet (80 mg total) by mouth 2 (two) times daily. What changed: when to take this   melatonin 5 MG Tabs Take 5 mg by mouth at bedtime.   polyethylene glycol 17 g packet Commonly known as: MIRALAX / GLYCOLAX Take 17 g by mouth in the morning.   Vitamin D3 Super Strength 50 MCG (2000 UT) Caps Generic drug: Cholecalciferol Take 2,000 Units by mouth in the morning.         Follow-up Information     [name], MD. Schedule an appointment as soon as possible for a visit in 1 week(s).   Specialties: Internal Medicine, Pediatrics Contact information: [address] [place] [address] [phone]         Jake Bathe, MD. Schedule an appointment as soon as possible for a visit in 1 week(s).   Specialty: Cardiology Contact information: [address] [place] [address] [phone]                 Major procedures and Radiology Reports - PLEASE review detailed and final reports thoroughly  -       CT HEAD WO CONTRAST ( )  Result Date: 07/02/2021 CLINICAL DATA:  Head trauma, mod-severe 87 year old post fall EXAM: CT HEAD WITHOUT  CONTRAST TECHNIQUE: Contiguous axial images were obtained from the base of the skull through the vertex without intravenous contrast. COMPARISON:  Head CT 07/14/2020 FINDINGS: Brain: Stable atrophy and chronic small vessel ischemia. No intracranial hemorrhage, mass effect, or midline shift. No hydrocephalus. The basilar cisterns are patent. No evidence of territorial infarct or acute ischemia. No extra-axial or intracranial fluid collection. Vascular: Atherosclerosis of skullbase vasculature without hyperdense vessel or abnormal calcification. Skull: No fracture or focal lesion. Sinuses/Orbits: Paranasal sinuses and mastoid air cells are clear. The visualized orbits are unremarkable. Bilateral cataract resection. Other: None. IMPRESSION: 1. No acute intracranial abnormality. No skull fracture. 2. Stable atrophy and chronic small vessel ischemia. Electronically Signed   By: Narda Rutherford M.D.   On: 07/02/2021 01:11   CT Cervical Spine Wo Contrast  Result Date: 07/02/2021 CLINICAL DATA:  87 year old post fall. Neck trauma (Age >= 65y) EXAM: CT CERVICAL SPINE WITHOUT CONTRAST TECHNIQUE: Multidetector CT imaging of the cervical spine was performed without intravenous contrast. Multiplanar CT image reconstructions were also generated. COMPARISON:  None. FINDINGS: Alignment: Normal. Skull base and vertebrae: No acute fracture. Vertebral body heights are maintained. The dens and skull base are intact. Soft tissues and spinal canal: No prevertebral fluid or swelling. No visible canal hematoma. Disc levels: Degenerative disc disease is most prominent at C6-C7, to a lesser extent C5-C6 and C7-T1. Mild scattered facet hypertrophy. Upper chest:  No acute or unexpected findings Other: None. IMPRESSION: Degenerative change in the cervical spine without acute fracture or subluxation. Electronically Signed   By: Narda Rutherford M.D.   On: 07/02/2021 01:14   DG Chest Port 1 View  Result Date: 07/03/2021 CLINICAL DATA:   Shortness of breath, COVID EXAM: PORTABLE CHEST 1 VIEW COMPARISON:  Chest radiograph 07/02/2021 FINDINGS: The left chest wall cardiac device and associated leads are unchanged. The cardiomediastinal silhouette is stable. There is no new focal airspace disease. Slight blunting of the left costophrenic angle is unchanged and may reflect a trace residual left pleural effusion. There is no significant right effusion. There is no pneumothorax. There is no acute osseous abnormality. IMPRESSION: Stable chest with no new focal airspace disease. Trace residual left pleural effusion is unchanged. Electronically Signed   By: Lesia Hausen M.D.   On: 07/03/2021 20:19   DG Chest Port 1 View  Result Date: 07/02/2021 CLINICAL DATA:  87 year old post fall. EXAM: PORTABLE CHEST 1 VIEW COMPARISON:  05/17/2021 FINDINGS: Left-sided pacemaker in place. The previous left pneumothorax is no longer seen. Decreased left pleural effusion from prior exam. Trace residual blunting of the costophrenic angle. No new airspace disease. No pulmonary edema. Stable heart size and mediastinal contours. No acute osseous abnormalities are seen. IMPRESSION: 1. No acute chest finding. Previous left pneumothorax is no longer seen. 2. Decreased left pleural effusion from prior exam. Electronically Signed   By: Narda Rutherford M.D.   On: 07/02/2021 01:06   VAS [place] LOWER EXTREMITY VENOUS (DVT)  Result Date: 07/04/2021  Lower Venous DVT Study Patient Name:  Matthew Howard  Date of Exam:   07/04/2021 Medical Rec #: [id number]         Accession #:    [id number] Date of Birth: 1919/04/15        Patient Gender: M Patient Age:   57 years Exam Location:  [facility] Procedure:      VAS [place] LOWER EXTREMITY VENOUS (DVT) Referring Phys: Bess Harvest [facility] --------------------------------------------------------------------------------  Indications: Rapidly rising D-dimer.  Comparison Study: 09/06/20 prior Performing Technologist: [place] Ponder RVS   Examination Guidelines: A complete evaluation includes B-mode imaging, spectral Doppler, color Doppler, and power Doppler as needed of all accessible portions of each vessel. Bilateral testing is considered an integral part of a complete examination. Limited examinations for reoccurring indications may be performed as noted. The reflux portion of the exam is performed with the patient in reverse Trendelenburg.  +---------+---------------+---------+-----------+----------+--------------+ RIGHT    CompressibilityPhasicitySpontaneityPropertiesThrombus Aging +---------+---------------+---------+-----------+----------+--------------+ CFV      Full           Yes      Yes                                 +---------+---------------+---------+-----------+----------+--------------+ SFJ      Full                                                        +---------+---------------+---------+-----------+----------+--------------+ FV Prox  Full                                                        +---------+---------------+---------+-----------+----------+--------------+  FV Mid   Full                                                        +---------+---------------+---------+-----------+----------+--------------+ FV DistalFull                                                        +---------+---------------+---------+-----------+----------+--------------+ PFV      Full                                                        +---------+---------------+---------+-----------+----------+--------------+ POP      Full           Yes      Yes                                 +---------+---------------+---------+-----------+----------+--------------+ PTV      Full                                                        +---------+---------------+---------+-----------+----------+--------------+ PERO     Full                                                         +---------+---------------+---------+-----------+----------+--------------+   +---------+---------------+---------+-----------+----------+-------------------+ LEFT     CompressibilityPhasicitySpontaneityPropertiesThrombus Aging      +---------+---------------+---------+-----------+----------+-------------------+ CFV      Full           Yes      Yes                                      +---------+---------------+---------+-----------+----------+-------------------+ SFJ      Full                                                             +---------+---------------+---------+-----------+----------+-------------------+ FV Prox  Full                                                             +---------+---------------+---------+-----------+----------+-------------------+ FV Mid   Full                                                             +---------+---------------+---------+-----------+----------+-------------------+  FV DistalFull                                                             +---------+---------------+---------+-----------+----------+-------------------+ PFV      Full                                                             +---------+---------------+---------+-----------+----------+-------------------+ POP      Full           Yes      Yes                                      +---------+---------------+---------+-----------+----------+-------------------+ PTV      Full                                                             +---------+---------------+---------+-----------+----------+-------------------+ PERO                                                  Not well visualized +---------+---------------+---------+-----------+----------+-------------------+     Summary: BILATERAL: - No evidence of deep vein thrombosis seen in the lower extremities, bilaterally. -No evidence of popliteal cyst, bilaterally.   *See table(s)  above for measurements and observations. Electronically signed by Waverly Ferrari MD on 07/04/2021 at 3:43:28 PM.    Final    ECHOCARDIOGRAM LIMITED  Result Date: 07/06/2021    ECHOCARDIOGRAM REPORT   Patient Name:   Matthew Howard Date of Exam: 07/06/2021 Medical Rec #:  [id number]        Height:       71.0 in Accession #:    [id number]       Weight:       171.1 lb Date of Birth:  Aug 31, 1919       BSA:          1.973 m Patient Age:    87 years         BP:           138/62 mmHg Patient Gender: M                HR:           69 bpm. Exam Location:  Inpatient Procedure: Limited Echo, Color Doppler and Cardiac Doppler                                 MODIFIED REPORT:     This report was modified by Riley Lam MD on 07/06/2021 due to  Clarification (comparison RVSP).  Indications:     I50.31 Acute diastolic (congestive) heart failure  History:         Patient has prior history of Echocardiogram examinations, most                  recent 04/26/2020. Pacemaker; Risk Factors:Dyslipidemia.  Sonographer:     Irving Burton Senior RDCS Referring Phys:  Effie Shy [facility] Diagnosing Phys: Riley Lam MD  Sonographer Comments: COVID+ at time of study IMPRESSIONS  1. Left ventricular ejection fraction, by estimation, is 50 to 55%. The left ventricle has low normal function. Left ventricular endocardial border not optimally defined to evaluate regional wall motion. Left ventricular diastolic parameters are indeterminate.  2. Right ventricular systolic function is mildly reduced. The right ventricular size is normal. There is moderately elevated pulmonary artery systolic pressure.  3. The mitral valve is grossly normal. Trivial mitral valve regurgitation.  4. Tricuspid valve regurgitation is mild to moderate.  5. The aortic valve was not well visualized. Aortic valve regurgitation is not visualized.  6. The inferior vena cava is normal in size with greater than 50% respiratory variability,  suggesting right atrial pressure of 3 mmHg. Comparison(s): A prior study was performed on 04/26/2020. Decrease in RV function from prior, RVSP is slightly improved from prior (53 mm Hg). FINDINGS  Left Ventricle: Left ventricular ejection fraction, by estimation, is 50 to 55%. The left ventricle has low normal function. Left ventricular endocardial border not optimally defined to evaluate regional wall motion. The left ventricular internal cavity  size was normal in size. There is no left ventricular hypertrophy. Abnormal (paradoxical) septal motion, consistent with left bundle branch block. Left ventricular diastolic parameters are indeterminate. Right Ventricle: The right ventricular size is normal. No increase in right ventricular wall thickness. Right ventricular systolic function is mildly reduced. There is moderately elevated pulmonary artery systolic pressure. The tricuspid regurgitant velocity is 3.28 m/s, and with an assumed right atrial pressure of 3 mmHg, the estimated right ventricular systolic pressure is 46.0 mmHg. Left Atrium: Left atrial size was normal in size. Right Atrium: Right atrial size was normal in size. Pericardium: There is no evidence of pericardial effusion. Mitral Valve: The mitral valve is grossly normal. Trivial mitral valve regurgitation. Tricuspid Valve: The tricuspid valve is grossly normal. Tricuspid valve regurgitation is mild to moderate. Aortic Valve: The aortic valve was not well visualized. Aortic valve regurgitation is not visualized. Pulmonic Valve: The pulmonic valve was not well visualized. Pulmonic valve regurgitation is not visualized. Aorta: The aortic root is normal in size and structure. Venous: The inferior vena cava is normal in size with greater than 50% respiratory variability, suggesting right atrial pressure of 3 mmHg. IAS/Shunts: The interatrial septum was not assessed. Additional Comments: A device lead is visualized.  LEFT VENTRICLE PLAX 2D LVOT diam:      2.00 cm LV SV:         45 LV SV Index:   23 LVOT Area:     3.14 cm  RIGHT VENTRICLE RV S prime:     5.22 cm/s TAPSE (M-mode): 1.1 cm AORTIC VALVE LVOT Vmax:   71.10 cm/s LVOT Vmean:  46.900 cm/s LVOT VTI:    0.143 m MITRAL VALVE               TRICUSPID VALVE MV Area (PHT): 4.01 cm    TR Peak grad:   43.0 mmHg MV Decel Time: 189 msec    TR Vmax:  328.00 cm/s MV E velocity: 81.90 cm/s MV A velocity: 33.10 cm/s  SHUNTS MV E/A ratio:  2.47        Systemic VTI:  0.14 m                            Systemic Diam: 2.00 cm Riley Lam MD Electronically signed by Riley Lam MD Signature Date/Time: 07/06/2021/12:27:31 PM    Final (Updated)       Today   Subjective    Matthew Howard today has no headache,no chest abdominal pain,no new weakness tingling or numbness, feels much better wants to go home today.     Objective   Blood pressure 138/62, pulse 68, temperature 98.1 F (36.7 C), temperature source Oral, resp. rate 18, height  (1.803 m), weight 77.6 kg, SpO2 100 %.   Intake/Output Summary (Last 24 hours) at 07/06/2021 1600 Last data filed at 07/06/2021 0810 Gross per 24 hour  Intake 240 ml  Output 850 ml  Net -610 ml    Exam  Awake Alert, No new F.N deficits, Normal affect [place].AT,PERRAL Supple Neck,No JVD, No cervical lymphadenopathy appriciated.  Symmetrical Chest wall movement, Good air movement bilaterally, CTAB RRR,No Gallops,Rubs or new Murmurs, No Parasternal Heave +ve B.Sounds, Abd Soft, Non tender, No organomegaly appriciated, No rebound -guarding or rigidity. No Cyanosis, Clubbing or edema, No new Rash or bruise   Data Review   CBC w Diff:  Lab Results  Component Value Date   WBC 10.4 07/06/2021   HGB 10.9 (L) 07/06/2021   HGB 11.6 (L) 05/04/2021   HCT 34.8 (L) 07/06/2021   HCT 35.5 (L) 05/04/2021   PLT 161 07/06/2021   PLT 186 05/04/2021   LYMPHOPCT 12 07/06/2021   MONOPCT 7 07/06/2021   EOSPCT 0 07/06/2021   BASOPCT 0 07/06/2021     CMP:  Lab Results  Component Value Date   NA 140 07/06/2021   NA 145 (H) 05/04/2021   K 4.4 07/06/2021   CL 101 07/06/2021   CO2 27 07/06/2021   BUN 29 (H) 07/06/2021   BUN 27 05/04/2021   CREATININE 1.19 07/06/2021   PROT 5.8 (L) 07/06/2021   PROT 7.0 05/04/2021   ALBUMIN 2.9 (L) 07/06/2021   ALBUMIN 4.1 05/04/2021   BILITOT 0.8 07/06/2021   BILITOT 0.7 05/04/2021   ALKPHOS 58 07/06/2021   AST 89 (H) 07/06/2021   ALT 75 (H) 07/06/2021  .   Total Time in preparing paper work, data evaluation and todays exam - 35 minutes  Susa Raring M.D on 07/06/2021 at 4:00 PM  [facility]

## 2021-07-06 NOTE — Progress Notes (Signed)
Focus of session on educating pt and daughter in law in energy conservation strategies and home safety/fall prevention. Both were receptive to information.    07/06/21 1200  OT Visit Information  Last OT Received On 07/06/21  Assistance Needed +1  History of Present Illness Matthew Howard is a 87 y.o. male with medical history significant of afib; BPH; HLD; dementia; and pacemaker placement presenting with a fall.  His DIL reports that he has had URI symptoms the last couple of days - rhinorrhea and cough.  He received his COVID booster 10 days ago.  He was diagnosed with acute COVID-19 infection along with bacterial bronchitis and admitted to the hospital.  Precautions  Precautions Fall  Precaution Comments COVID precautions; HOH (hearing aides)  Pain Assessment  Pain Assessment Faces  Faces Pain Scale 0  Cognition  Arousal/Alertness Awake/alert  Behavior During Therapy Flat affect  Overall Cognitive Status History of cognitive impairments - at baseline  General Comments decreased awareness of deficits, daughter in law to provide 24 hour supervision  ADL  General ADL Comments Educated in energy conservation strategies and provided written handout to reinforce.  Bed Mobility  General bed mobility comments in chair  Balance  Overall balance assessment Needs assistance;History of Falls  Sitting-balance support Feet supported  Sitting balance-Leahy Scale Good  Standing balance support Bilateral upper extremity supported;Single extremity supported;No upper extremity supported;During functional activity  Standing balance-Leahy Scale Fair  OT - End of Session  Activity Tolerance Patient tolerated treatment well  Patient left in chair;with call bell/phone within reach;with family/visitor present;with chair alarm set  Nurse Communication Other (comment) (pt to d/c after echo)  OT Assessment/Plan  OT Plan Discharge plan needs to be updated  OT Visit Diagnosis Unsteadiness on feet  (R26.81);Repeated falls (R29.6);Muscle weakness (generalized) (M62.81);History of falling (Z91.81)  OT Frequency (ACUTE ONLY) Min 2X/week  Follow Up Recommendations No OT follow up;Supervision/Assistance - 24 hour (family declining HH)  OT Equipment 3 in 1 bedside commode  AM-PAC OT "6 Clicks" Daily Activity Outcome Measure (Version 2)  Help from another person eating meals? 4  Help from another person taking care of personal grooming? 3  Help from another person toileting, which includes using toliet, bedpan, or urinal? 3  Help from another person bathing (including washing, rinsing, drying)? 3  Help from another person to put on and taking off regular upper body clothing? 3  Help from another person to put on and taking off regular lower body clothing? 3  6 Click Score 19  Progressive Mobility  What is the highest level of mobility based on the progressive mobility assessment? Level 4 (Walks with assist in room) - Balance while marching in place and cannot step forward and back - Complete  Mobility Ambulated with assistance in room  OT Goal Progression  Progress towards OT goals Progressing toward goals  Acute Rehab OT Goals  Patient Stated Goal to go home  OT Goal Formulation With patient/family  Time For Goal Achievement 07/18/21  Potential to Achieve Goals Fair  OT Time Calculation  OT Start Time (ACUTE ONLY) 1011  OT Stop Time (ACUTE ONLY) 1033  OT Time Calculation (min) 22 min  OT General Charges  $OT Visit 1 Visit  OT Treatments  $Self Care/Home Management  8-22 mins  Martie Round, OTR/L Acute Rehabilitation Services Pager: [phone] Office: [phone]

## 2021-07-06 NOTE — Progress Notes (Signed)
Echocardiogram 2D Echocardiogram has been performed.  Warren Lacy Heywood Tokunaga RDCS 07/06/2021, 11:38 AM

## 2021-07-10 ENCOUNTER — Telehealth: Payer: Self-pay | Admitting: Internal Medicine

## 2021-07-10 NOTE — Telephone Encounter (Signed)
Pt daughter called to schedule a HFU from him having Covid.  She said that she wanted a call back to discuss him having a bad cough and him having a chest xray. She wanted to wait to schedule til she heard back

## 2021-07-10 NOTE — Telephone Encounter (Signed)
Pt daughter asking for a call,says pt is home from the hospital and still having a bad cough

## 2021-07-10 NOTE — Telephone Encounter (Signed)
Tried son and DIL's numbers and both went to voice mail. I will try again in the morning

## 2021-07-10 NOTE — Telephone Encounter (Signed)
Left a message for Matthew Howard to call the office or send a MyChart message about her concern with his cough. Asked of they sent him home with anything for the cough. Also, I believe a Chest X-ray can be done after a certain amount of time.

## 2021-07-11 ENCOUNTER — Telehealth: Payer: Self-pay

## 2021-07-11 MED ORDER — DOXYCYCLINE HYCLATE 100 MG PO TABS: 100.0000 mg | ORAL_TABLET | Freq: Two times a day (BID) | ORAL | 0 refills | Status: DC

## 2021-07-11 NOTE — Telephone Encounter (Signed)
I sent the Doxy to [facility] as requested by Dr Vara Guardian. Matthew Howard was speaking to DIL. She will make her aware we have sent that.

## 2021-07-11 NOTE — Telephone Encounter (Signed)
Pt daughter called in stated Pt has a UTI wants to see Dr. Alphonsus Sias today .advise her there no appointments stated can something be call in . Please Advise  [phone]

## 2021-07-11 NOTE — Telephone Encounter (Signed)
Transition Care Management Unsuccessful Follow-up Telephone Call  Date of discharge and from where:  07/06/21 from [facility]  Attempts:  1st Attempt  Reason for unsuccessful TCM follow-up call:  Left voice message    Kathyrn Sheriff, RN, MSN, BSN, CCM [facility] Care Management Coordinator [phone]

## 2021-07-11 NOTE — Telephone Encounter (Signed)
Spoke to DIL, [employer]. She states the hospital told her he needed a Chest X-ray 1 week after d/c. Still coughing a lot. Coughs up yellow sputum. Was giving him Robitussin DM. Wonders if Mucinex would be better. He was sent home on Doxycycline for 3 days. So he had a total of 7 day course. Has had some improvement but as noted above, he is still coughs a lot.  He was given Eliquis for 1 week. Is he supposed to continue that? Not sleeping well. States he is voiding a lot more. Giving him the Lasix 80mg  daily.

## 2021-07-11 NOTE — Telephone Encounter (Signed)
I will add this to the 2nd note that was opened yesterday afternoon after this one.

## 2021-07-11 NOTE — Telephone Encounter (Signed)
Pt daughter Bernadene Bell return call . Would like a call back # [phone] .  Has medication concern .  Would like for Pt to get a chest Xray . Please Advise

## 2021-07-11 NOTE — Telephone Encounter (Signed)
July 10, 2021 Me     2:11 PM Note Left a message for DIL Gavin Pound to call the office or send a MyChart message about her concern with his cough. Asked of they sent him home with anything for the cough. Also, I believe a Chest X-ray can be done after a certain amount of time.

## 2021-07-11 NOTE — Addendum Note (Signed)
Addended by: Eual Fines on: 07/11/2021 03:19 PM   Modules accepted: Orders

## 2021-07-11 NOTE — Telephone Encounter (Addendum)
    [name], MD to Eual Fines, CMA     1:05 PM They can try the mucinex but it usually produces more sputum--that might not help  Continue the lasix daily  [name], MD to Eual Fines, CMA     1:05 PM Extend the doxycycline for 5 more days -- 100mg  bid  The eliquis was a preventative--he should finish what he has and then change back to an aspirin (81mg ) daily  Set up OV for when I get back    Spoke with  and gave instructions from Dr from earlier. I spoke with Gavin Pound CMA and she said if pt will only see Dr Alphonsus Sias to schedule appt with Dr Carollee Herter 07/12/21 at 12:15. Alphonsus Sias said since pts lungs are not clear she appreciates doxycycline sent to [facility] per Dr 09/11/21 instruction. Pt is urinating q 15' and voiding small amts. No fever or burning or pain upon urination.  Deborah scheduled appt with Dr Gavin Pound in office on 07/12/21 at 12:15. UC & ED precautions given and Alphonsus Sias voiced understanding. Sending note to Dr 09/11/21 and Gavin Pound CMA.

## 2021-07-12 ENCOUNTER — Encounter: Payer: Self-pay | Admitting: Internal Medicine

## 2021-07-12 ENCOUNTER — Ambulatory Visit (INDEPENDENT_AMBULATORY_CARE_PROVIDER_SITE_OTHER): Payer: Medicare Other | Admitting: Internal Medicine

## 2021-07-12 ENCOUNTER — Other Ambulatory Visit: Payer: Self-pay

## 2021-07-12 ENCOUNTER — Telehealth: Payer: Self-pay

## 2021-07-12 DIAGNOSIS — N401 Enlarged prostate with lower urinary tract symptoms: Secondary | ICD-10-CM

## 2021-07-12 DIAGNOSIS — R3915 Urgency of urination: Secondary | ICD-10-CM

## 2021-07-12 DIAGNOSIS — I5032 Chronic diastolic (congestive) heart failure: Secondary | ICD-10-CM

## 2021-07-12 DIAGNOSIS — U071 COVID-19: Secondary | ICD-10-CM | POA: Diagnosis not present

## 2021-07-12 DIAGNOSIS — R350 Frequency of micturition: Secondary | ICD-10-CM

## 2021-07-12 LAB — POC URINALSYSI DIPSTICK (AUTOMATED)
Bilirubin, UA: NEGATIVE
Glucose, UA: NEGATIVE
Ketones, UA: NEGATIVE
Nitrite, UA: POSITIVE
Protein, UA: POSITIVE — AB
Spec Grav, UA: 1.01 (ref 1.010–1.025)
Urobilinogen, UA: 0.2 E.U./dL
pH, UA: 7.5 (ref 5.0–8.0)

## 2021-07-12 MED ORDER — TAMSULOSIN HCL 0.4 MG PO CAPS: 0.4000 mg | ORAL_CAPSULE | Freq: Every day | ORAL | 3 refills | Status: AC

## 2021-07-12 MED ORDER — LEVOFLOXACIN 250 MG PO TABS: 250.0000 mg | ORAL_TABLET | Freq: Every day | ORAL | 0 refills | Status: DC

## 2021-07-12 NOTE — Patient Instructions (Signed)
Please stop the doxycycline and give the first dose of levaquin right away

## 2021-07-12 NOTE — Addendum Note (Signed)
Addended by: Eual Fines on: 07/12/2021 02:09 PM   Modules accepted: Orders

## 2021-07-12 NOTE — Progress Notes (Signed)
Subjective:    Patient ID: Matthew Howard, male    DOB: [birth year], 87 y.o.   MRN: [id number]  HPI Here with DIL due to urinary symptoms This visit occurred during the SARS-CoV-2 public health emergency.  Safety protocols were in place, including screening questions prior to the visit, additional usage of staff PPE, and extensive cleaning of exam room while observing appropriate contact time as indicated for disinfecting solutions.   Recent hospitalization for COVID Reviewed records--got remdesevir IV Some cough---hard at times. Will occasionally bring something up--DIL states it is a lot. Now thinner--with tinge of yellow/green  Now he is having to pee every 10-15 minutes Frequent stools also--loose at times (had been constipated upon getting home--but now cleared out and going a lot) No dysuria No hematuria Not much quantity DIL did do home test---very strong leuks and nitrates Continues on the proscar---tamsulosin stopped  Reviewed labs in hospital--GFR 55  DIL has been giving 80mg  daily of furosemide Today she just gave 40mg  No edema Weight is stable  Current Outpatient Medications on File Prior to Visit  Medication Sig Dispense Refill   albuterol (VENTOLIN HFA) 108 (90 Base) MCG/ACT inhaler Inhale 2 puffs into the lungs every 4 (four) hours as needed for wheezing or shortness of breath. 8 g 0   allopurinol (ZYLOPRIM) 100 MG tablet TAKE 2 TABLETS BY MOUTH EVERY DAY (Patient taking differently: Take 200 mg by mouth in the morning.) 180 tablet 3   apixaban (ELIQUIS) 2.5 MG TABS tablet Take 1 tablet (2.5 mg total) by mouth 2 (two) times daily. 14 tablet 0   [START ON 07/13/2021] aspirin 81 MG EC tablet Take 1 tablet (81 mg total) by mouth in the morning. 30 tablet 12   colchicine 0.6 MG tablet TAKE 1 TABLET (0.6 MG TOTAL) BY MOUTH 2 (TWO) TIMES DAILY AS NEEDED. (Patient taking differently: Take 0.6 mg by mouth 2 (two) times daily as needed (for gout flares).) 60 tablet 0    Cyanocobalamin 2500 MCG TABS Take 2,500 mcg by mouth daily.      donepezil (ARICEPT) 5 MG tablet Take 5 mg by mouth in the morning.     doxycycline (VIBRA-TABS) 100 MG tablet Take 1 tablet (100 mg total) by mouth every 12 (twelve) hours. 10 tablet 0   finasteride (PROSCAR) 5 MG tablet TAKE 1 TABLET BY MOUTH DAILY (Patient taking differently: Take 5 mg by mouth in the morning.) 90 tablet 3   furosemide (LASIX) 80 MG tablet Take 1 tablet (80 mg total) by mouth 2 (two) times daily. (Patient taking differently: Take 80 mg by mouth in the morning.) 180 tablet 1   melatonin 5 MG TABS Take 5 mg by mouth at bedtime.     polyethylene glycol (MIRALAX / GLYCOLAX) 17 g packet Take 17 g by mouth in the morning.     VITAMIN D3 SUPER STRENGTH 50 MCG (2000 UT) CAPS Take 2,000 Units by mouth in the morning.     No current facility-administered medications on file prior to visit.    Allergies  Allergen Reactions   Tape Other (See Comments)    THE PATIENT'S SKIN IS VERY DELICATE- WILL TEAR!!    Past Medical History:  Diagnosis Date   Advance directive discussed with patient 07/15/2015   Atrial fibrillation (HCC) 01/23/2011   Benign prostatic hyperplasia (BPH) with urinary urgency 09/11/2007   Qualifier: Diagnosis of  By: 01/25/2011 MD, 14/01/2007    Dementia [facility])    Gout    Hyperlipidemia  Left inguinal hernia 03/29/2020   Mobitz type II atrioventricular block 01/25/2014   Pacemaker 04/27/2014   Pedal edema 09/10/2016   Routine general medical examination at a health care facility 07/01/2013   Scrotal right inuinal hernia 08/13/2019   Symptomatic bradycardia    s/p dual chamber PPM implantation April 2015 [facility])    Past Surgical History:  Procedure Laterality Date   CATARACT EXTRACTION     PACEMAKER INSERTION  01-25-2014   STJ Assurity dual chamber pacemaker implanted by Dr [name] for symptomatic bradycardia   PERMANENT PACEMAKER INSERTION N/A 01/25/2014   Procedure: PERMANENT  PACEMAKER INSERTION;  Surgeon: Marinus Maw, MD;  Location: [facility] CATH LAB;  Service: Cardiovascular;  Laterality: N/A;   TONSILLECTOMY      Family History  Problem Relation Age of Onset   Cancer Mother    Throat cancer Father     Social History   Socioeconomic History   Marital status: Widowed    Spouse name: Clavin Ruhlman   Number of children: 4   Years of education: Not on file   Highest education level: Not on file  Occupational History   Occupation: retired    Comment: [employer]  Tobacco Use   Smoking status: Never   Smokeless tobacco: Never  Vaping Use   Vaping Use: Never used  Substance and Sexual Activity   Alcohol use: Yes    Comment: rare etoh use   Drug use: No   Sexual activity: Not on file  Other Topics Concern   Not on file  Social History Narrative   Widowed 4/15   No living will   Health care power of attorney is son [name]. Daughter [name] is financial POA    He feels he is ready for DNR---form done 08/13/19 (but he isn't sure he will put it up)   No tube feeds if cognitively unaware   Social Determinants of Health   Financial Resource Strain: Not on file  Food Insecurity: Not on file  Transportation Needs: Not on file  Physical Activity: Not on file  Stress: Not on file  Social Connections: Not on file  Intimate Partner Violence: Not on file   Review of Systems No fever--but some chills No N/V Eating fairly well    Objective:   Physical Exam Constitutional:      General: He is not in acute distress. Cardiovascular:     Rate and Rhythm: Normal rate and regular rhythm.     Heart sounds: No murmur heard.   No gallop.  Pulmonary:     Effort: Pulmonary effort is normal.     Breath sounds: No rales.     Comments: Very slight expiratory wheeze but not tight Abdominal:     Palpations: Abdomen is soft.     Tenderness: There is no abdominal tenderness.  Musculoskeletal:     Cervical back: Neck supple.     Right lower leg: No edema.      Left lower leg: No edema.  Neurological:     Mental Status: He is alert.           Assessment & Plan:

## 2021-07-12 NOTE — Assessment & Plan Note (Signed)
Restart tamsulosin--watch for dizziness Will need to go back to Dr Annabell Howells if ongoing problems

## 2021-07-12 NOTE — Telephone Encounter (Signed)
Noted  

## 2021-07-12 NOTE — Assessment & Plan Note (Signed)
This still seems compensated Will continue furosemide 80mg  daily depending on his weight

## 2021-07-12 NOTE — Assessment & Plan Note (Signed)
Urinalysis shows blood and leukocytes I suspect more urinary retention off the tamsulosin---will restart and monitor for dizziness Will send culture [facility] Rx with levaquin 250mg  daily for 5 days (for respiratory infection as well)

## 2021-07-12 NOTE — Assessment & Plan Note (Signed)
Seems to be improving Some degree of bronchitis and some SOB---but CXR didn't really show pneumonia  Will stop the doxy 5 days of low dose levaquin--discussed black box warning

## 2021-07-12 NOTE — Telephone Encounter (Signed)
Transition Care Management Follow-up Telephone Call Spoke with son Matthew Howard DPR. Patient at PCP appointment. Date of discharge and from where: 07/06/21 from [facility] How have you been since you were released from the hospital? [facility]" Any questions or concerns? No  Items Reviewed: Did the pt receive and understand the discharge instructions provided? Yes  Medications obtained and verified? Yes  Other? No  Any new allergies since your discharge? No  Dietary orders reviewed? Yes Do you have support at home? Yes   Home Care and Equipment/Supplies: Were home health services ordered? no If so, what is the name of the agency? Not applicable  Has the agency set up a time to come to the patient's home? not applicable Were any new equipment or medical supplies ordered?  No What is the name of the medical supply agency? Not applicable Were you able to get the supplies/equipment? not applicable Do you have any questions related to the use of the equipment or supplies? No  Functional Questionnaire: (I = Independent and D = Dependent) ADLs: D  Bathing/Dressing- D  Meal Prep- D  Eating- I  Maintaining continence- I  Transferring/Ambulation- D  Managing Meds- D  Follow up appointments reviewed:  PCP Hospital f/u appt confirmed? Yes  Scheduled to see Dr. Alphonsus Sias on 07/12/21 @ [facility] f/u appt confirmed? No  family has called to try to arrange a follow up appointment and states will arrange.  Are transportation arrangements needed? No  If their condition worsens, is the pt aware to call PCP or go to the Emergency Dept.? Yes Was the patient provided with contact information for the PCP's office or ED? Yes Was to pt encouraged to call back with questions or concerns? Yes    Kathyrn Sheriff, RN, MSN, BSN, CCM [facility] Care Management Coordinator [phone]

## 2021-07-14 ENCOUNTER — Telehealth: Payer: Self-pay | Admitting: *Deleted

## 2021-07-14 ENCOUNTER — Telehealth: Payer: Self-pay | Admitting: Internal Medicine

## 2021-07-14 LAB — URINE CULTURE
MICRO NUMBER:: 12464471
SPECIMEN QUALITY:: ADEQUATE

## 2021-07-14 NOTE — Telephone Encounter (Signed)
Transition Care Management Follow-up Telephone Call Date of discharge and from where:  07/06/2021   [facility] How have you been since you were released from the hospital? "Doing well"   spoke with patient's son due to patient is 87 years old and cannot answer the questions Any questions or concerns? No  Items Reviewed: Did the pt receive and understand the discharge instructions provided? Yes  Medications obtained and verified? Yes  Other? No  Any new allergies since your discharge? No  Dietary orders reviewed? Yes    heart healthy with 1.5 L/day fluid restrictions Do you have support at home? Yes   Home Care and Equipment/Supplies: Were home health services ordered? No, per son- pt/ family did not want home health If so, what is the name of the agency? N/a  Has the agency set up a time to come to the patient's home? not applicable Were any new equipment or medical supplies ordered?  No What is the name of the medical supply agency? N/a Were you able to get the supplies/equipment? not applicable Do you have any questions related to the use of the equipment or supplies? N/a  Functional Questionnaire: (I = Independent and D = Dependent) ADLs: D  Bathing/Dressing- D  Meal Prep- D  Eating- D  Maintaining continence- D  Transferring/Ambulation- D  Managing Meds- D  Follow up appointments reviewed:  PCP Hospital f/u appt confirmed? Yes  Saw Dr. Alphonsus Sias on 07/12/2021  Specialist Hospital f/u appt confirmed? Yes  Scheduled to see Dr. Anne Fu on 09/06/2021 Are transportation arrangements needed? No  If their condition worsens, is the pt aware to call PCP or go to the Emergency Dept.? Yes Was the patient provided with contact information for the PCP's office or ED? Yes Was to pt encouraged to call back with questions or concerns? Yes  Irving Shows [facility], BSN RN Case Manager [phone]

## 2021-07-14 NOTE — Telephone Encounter (Signed)
Spoke to [employer]. See Lab Note.

## 2021-07-14 NOTE — Telephone Encounter (Signed)
Pt daughter  called wanting results of the urine culture

## 2021-07-24 ENCOUNTER — Ambulatory Visit (INDEPENDENT_AMBULATORY_CARE_PROVIDER_SITE_OTHER): Payer: Medicare Other

## 2021-07-24 DIAGNOSIS — I441 Atrioventricular block, second degree: Secondary | ICD-10-CM

## 2021-07-25 ENCOUNTER — Other Ambulatory Visit (HOSPITAL_COMMUNITY): Payer: Self-pay

## 2021-07-25 ENCOUNTER — Telehealth (HOSPITAL_COMMUNITY): Payer: Self-pay

## 2021-07-25 LAB — CUP PACEART REMOTE DEVICE CHECK
Battery Remaining Longevity: 20 mo
Battery Remaining Percentage: 18 %
Battery Voltage: 2.89 V
Brady Statistic AP VP Percent: 46 %
Brady Statistic AP VS Percent: 1 %
Brady Statistic AS VP Percent: 53 %
Brady Statistic AS VS Percent: 1 %
Brady Statistic RA Percent Paced: 5.4 %
Brady Statistic RV Percent Paced: 98 %
Date Time Interrogation Session: 20221017151523
Implantable Lead Implant Date: 20150420
Implantable Lead Implant Date: 20150420
Implantable Lead Location: 753859
Implantable Lead Location: 753860
Implantable Pulse Generator Implant Date: 20150420
Lead Channel Impedance Value: 400 Ohm
Lead Channel Impedance Value: 460 Ohm
Lead Channel Pacing Threshold Amplitude: 0.5 V
Lead Channel Pacing Threshold Amplitude: 1 V
Lead Channel Pacing Threshold Pulse Width: 0.4 ms
Lead Channel Pacing Threshold Pulse Width: 0.4 ms
Lead Channel Sensing Intrinsic Amplitude: 10.5 mV
Lead Channel Sensing Intrinsic Amplitude: 2.4 mV
Lead Channel Setting Pacing Amplitude: 2 V
Lead Channel Setting Pacing Amplitude: 2.5 V
Lead Channel Setting Pacing Pulse Width: 0.4 ms
Lead Channel Setting Sensing Sensitivity: 2 mV
Pulse Gen Model: 2240
Pulse Gen Serial Number: 3013717

## 2021-07-25 NOTE — Telephone Encounter (Signed)
Pharmacy Transitions of Care Follow-up Telephone Call  Date of discharge: 07/06/21  Discharge Diagnosis: afib  How have you been since you were released from the hospital?    Medication changes made at discharge:  - START: eliquis  - STOPPED:   - CHANGED:   Medication changes verified by the patient?  (Yes/No)    Medication Accessibility:  Home Pharmacy:    Was the patient provided with refills on discharged medications? no   Have all prescriptions been transferred from [facility] to home pharmacy? no   Is the patient able to afford medications?  Notable copays:  Eligible patient assistance:     Medication Review: (  APIXABAN (ELIQUIS)  Apixaban 10 mg BID initiated on . Will switch to apixaban 5 mg BID after 7 days   - Discussed importance of taking medication around the same time everyday  - Reviewed potential DDIs with patient  - Advised patient of medications to avoid (NSAIDs, ASA)  - Educated that Tylenol (acetaminophen) will be the preferred analgesic to prevent risk of bleeding  - Emphasized importance of monitoring for signs and symptoms of bleeding (abnormal bruising, prolonged bleeding, nose bleeds, bleeding from gums, discolored urine, black tarry stools)  - Advised patient to alert all providers of anticoagulation therapy prior to starting a new medication or having a procedure   RFollow-up Appointments:  PCP Hospital f/u appt confirmed? yes  Pt has been to his follow up appts already.  If their condition worsens, is the pt aware to call PCP or go to the Emergency Dept.? yes  Final Patient Assessment: Patient's daughter is a Teacher, early years/pre and there are no questions on medication or otherwise. Patient has dementia. Patient has been to his follow up appts.

## 2021-07-26 ENCOUNTER — Telehealth: Payer: Self-pay | Admitting: Cardiology

## 2021-07-27 ENCOUNTER — Encounter: Payer: Self-pay | Admitting: Cardiology

## 2021-07-27 ENCOUNTER — Other Ambulatory Visit: Payer: Self-pay

## 2021-07-27 ENCOUNTER — Ambulatory Visit: Payer: Medicare Other | Admitting: Cardiology

## 2021-07-27 DIAGNOSIS — I5032 Chronic diastolic (congestive) heart failure: Secondary | ICD-10-CM | POA: Diagnosis not present

## 2021-07-27 DIAGNOSIS — Z950 Presence of cardiac pacemaker: Secondary | ICD-10-CM

## 2021-07-27 MED ORDER — FUROSEMIDE 40 MG PO TABS: 40.0000 mg | ORAL_TABLET | Freq: Every day | ORAL | 3 refills | Status: AC

## 2021-07-27 NOTE — Assessment & Plan Note (Signed)
Followed by Dr. [name], not MRI compatible.  Doing well.

## 2021-07-27 NOTE — Assessment & Plan Note (Signed)
Very stable currently.  I agree with Dr. Alphonsus Sias decreasing his Lasix to 80 mg a day in fact I think would be reasonable to try 40 mg a day at this point.  Obviously if his weight starts to increase, he can always go back to the 80 mg a day.  We do not want to over diurese given prior syncopal type episodes.

## 2021-07-27 NOTE — Patient Instructions (Signed)
Medication Instructions:  Please decrease your Furosemide to 40 mg a day. Continue all other medications as listed.  *If you need a refill on your cardiac medications before your next appointment, please call your pharmacy*  Follow-Up: At [facility], you and your health needs are our priority.  As part of our continuing mission to provide you with exceptional heart care, we have created designated Provider Care Teams.  These Care Teams include your primary Cardiologist (physician) and Advanced Practice Providers (APPs -  Physician Assistants and Nurse Practitioners) who all work together to provide you with the care you need, when you need it.  We recommend signing up for the patient portal called "MyChart".  Sign up information is provided on this After Visit Summary.  MyChart is used to connect with patients for Virtual Visits (Telemedicine).  Patients are able to view lab/test results, encounter notes, upcoming appointments, etc.  Non-urgent messages can be sent to your provider as well.   To learn more about what you can do with MyChart, go to [email or web address].    Your next appointment:   1 year(s)  The format for your next appointment:   In Person  Provider:   [name], MD   Thank you for choosing [facility]!!

## 2021-07-27 NOTE — Progress Notes (Signed)
Cardiology Office Note:    Date:  07/27/2021   ID:  Matthew Howard, DOB 08/17/1919, MRN [id number]  PCP:  [name], MD   [facility] Providers Cardiologist:  [name], MD     Referring MD: [name], MD    History of Present Illness:    Matthew Howard is a 87 y.o. male here for post hospital follow-up, shortness of breath.  Hospitalization on 07/02/2021 with COVID-19, received IV remdesivir.Marland Kitchen  Hospitalization on 05/05/2021 with pneumothorax.  Had a mechanical fall small laceration on the back of the scalp head CT was negative never lost consciousness.  EF on echocardiogram in 2020 normal.  Mild troponin elevation likely secondary to demand ischemia in the setting of COVID infection.  D-dimer was elevated.  He was placed on prophylactic Eliquis for 2 weeks told to skip his aspirin for 2 weeks, no tachycardia noted.  He was last seen in our clinic on 05/04/2021 by DOD, Dr. Eldridge Dace.  Dr. [name] and been following pacemaker atrial fibrillation.  Pacemaker is not MRI compatible.  He had a syncopal episode in January 2022 that could have been related to overdiuresis.  His daughter is a Teacher, early years/pre.  Previously got dizzy at the [employer] felt better after drinking water.  Following day had diarrhea fell down in his bathroom.  He was trying to change himself due to incontinence.  He also had gout treated with colchicine in the past.  Has chronic kidney disease chronic diastolic heart failure.  Today he is here with his daughter.  She had several questions.  She asked if he could get a urinalysis, I deferred this to Dr. Alphonsus Sias.  She asked if it would be okay to get the flu shot, originally I said yes, then she asked if it would be okay this shortly after his COVID infection.  I asked her to consider the opinion of Dr. Alphonsus Sias in the situation.  She was quite frustrated.  I did try to explain to her that the scope of this visit would try to stick to the cardiac issues at  hand.  I think it would be reasonable to decrease his Lasix as well to 40 mg as long as we continue with his daily weights.  If his weights do increase, it is not unreasonable to increase back to the 80 mg.  Past Medical History:  Diagnosis Date   Advance directive discussed with patient 07/15/2015   Atrial fibrillation (HCC) 01/23/2011   Benign prostatic hyperplasia (BPH) with urinary urgency 09/11/2007   Qualifier: Diagnosis of  By: Alphonsus Sias MD, Ronnette Hila    Dementia [facility])    Gout    Hyperlipidemia    Left inguinal hernia 03/29/2020   Mobitz type II atrioventricular block 01/25/2014   Pacemaker 04/27/2014   Pedal edema 09/10/2016   Routine general medical examination at a health care facility 07/01/2013   Scrotal right inuinal hernia 08/13/2019   Symptomatic bradycardia    s/p dual chamber PPM implantation April 2015 [facility])    Past Surgical History:  Procedure Laterality Date   CATARACT EXTRACTION     PACEMAKER INSERTION  01-25-2014   STJ Assurity dual chamber pacemaker implanted by Dr [name] for symptomatic bradycardia   PERMANENT PACEMAKER INSERTION N/A 01/25/2014   Procedure: PERMANENT PACEMAKER INSERTION;  Surgeon: Marinus Maw, MD;  Location: [facility] CATH LAB;  Service: Cardiovascular;  Laterality: N/A;   TONSILLECTOMY      Current Medications: Current Meds  Medication Sig  albuterol (VENTOLIN HFA) 108 (90 Base) MCG/ACT inhaler Inhale 2 puffs into the lungs every 4 (four) hours as needed for wheezing or shortness of breath.   allopurinol (ZYLOPRIM) 100 MG tablet TAKE 2 TABLETS BY MOUTH EVERY DAY   aspirin 81 MG EC tablet Take 1 tablet (81 mg total) by mouth in the morning.   colchicine 0.6 MG tablet TAKE 1 TABLET (0.6 MG TOTAL) BY MOUTH 2 (TWO) TIMES DAILY AS NEEDED.   Cyanocobalamin 2500 MCG TABS Take 2,500 mcg by mouth daily.    donepezil (ARICEPT) 5 MG tablet Take 5 mg by mouth in the morning.   finasteride (PROSCAR) 5 MG tablet TAKE 1 TABLET BY MOUTH DAILY    furosemide (LASIX) 40 MG tablet Take 1 tablet (40 mg total) by mouth daily.   melatonin 5 MG TABS Take 5 mg by mouth at bedtime.   polyethylene glycol (MIRALAX / GLYCOLAX) 17 g packet Take 17 g by mouth in the morning.   tamsulosin (FLOMAX) 0.4 MG CAPS capsule Take 1 capsule (0.4 mg total) by mouth daily.   VITAMIN D3 SUPER STRENGTH 50 MCG (2000 UT) CAPS Take 2,000 Units by mouth in the morning.   [DISCONTINUED] furosemide (LASIX) 80 MG tablet Take 1 tablet (80 mg total) by mouth 2 (two) times daily. (Patient taking differently: Take 80 mg by mouth daily.)     Allergies:   Tape   Social History   Socioeconomic History   Marital status: Widowed    Spouse name: Kade Demicco   Number of children: 4   Years of education: Not on file   Highest education level: Not on file  Occupational History   Occupation: retired    Comment: [employer]  Tobacco Use   Smoking status: Never   Smokeless tobacco: Never  Vaping Use   Vaping Use: Never used  Substance and Sexual Activity   Alcohol use: Yes    Comment: rare etoh use   Drug use: No   Sexual activity: Not on file  Other Topics Concern   Not on file  Social History Narrative   Widowed 4/15   No living will   Health care power of attorney is son [name]. Daughter [name] is financial POA    He feels he is ready for DNR---form done 08/13/19 (but he isn't sure he will put it up)   No tube feeds if cognitively unaware   Social Determinants of Health   Financial Resource Strain: Not on file  Food Insecurity: Not on file  Transportation Needs: Not on file  Physical Activity: Not on file  Stress: Not on file  Social Connections: Not on file     Family History: The patient's family history includes Cancer in his mother; Throat cancer in his father.  ROS:   Please see the history of present illness.     All other systems reviewed and are negative.  EKGs/Labs/Other Studies Reviewed:    The following studies were reviewed  today: Hospital records reviewed, lab work reviewed  Recent Labs: 05/04/2021: NT-Pro BNP 1,477 05/05/2021: B Natriuretic Peptide 175.5 07/06/2021: ALT 75; BUN 29; Creatinine, Ser 1.19; Hemoglobin 10.9; Magnesium 2.0; Platelets 161; Potassium 4.4; Sodium 140  Recent Lipid Panel    Component Value Date/Time   CHOL 141 11/01/2008 1534   TRIG 104 11/01/2008 1534   HDL 43.4 11/01/2008 1534   CHOLHDL 3.2 CALC 11/01/2008 1534   VLDL 21 11/01/2008 1534   LDLCALC 77 11/01/2008 1534     Risk Assessment/Calculations:  Physical Exam:    VS:  BP (!) 120/40 (BP Location: Left Arm, Patient Position: Sitting, Cuff Size: Normal)   Pulse 70   Ht 5\' 11"  (1.803 m)   Wt 169 lb (76.7 kg)   SpO2 97%   BMI 23.57 kg/m     Wt Readings from Last 3 Encounters:  07/27/21 169 lb (76.7 kg)  07/12/21 164 lb (74.4 kg)  07/06/21 171 lb 1.2 oz (77.6 kg)     GEN:  Well nourished, well developed in no acute distress HEENT: Normal NECK: No JVD; No carotid bruits LYMPHATICS: No lymphadenopathy CARDIAC: RRR, no murmurs, rubs, gallops RESPIRATORY:  Clear to auscultation without rales, wheezing or rhonchi  ABDOMEN: Soft, non-tender, non-distended MUSCULOSKELETAL:  No edema; No deformity  SKIN: Warm and dry NEUROLOGIC:  Alert and oriented x 3 PSYCHIATRIC:  Normal affect   ASSESSMENT:    1. Pacemaker   2. Chronic diastolic heart failure (HCC)    PLAN:    In order of problems listed above:    Pacemaker Followed by Dr. 07/08/21, not MRI compatible.  Doing well.  Chronic diastolic heart failure (HCC) Very stable currently.  I agree with Dr. [name] decreasing his Lasix to 80 mg a day in fact I think would be reasonable to try 40 mg a day at this point.  Obviously if his weight starts to increase, he can always go back to the 80 mg a day.  We do not want to over diurese given prior syncopal type episodes.   She has several good questions, about the need to repeat urinalysis the request for  urinalysis, question about flu vaccine, question about repeating lab work, questions about possible leukemia.  She had questions about his urinary incontinence, tamsulosin.  I asked her to make sure she addresses these with Dr. Alphonsus Sias and his team.    Medication Adjustments/Labs and Tests Ordered: Current medicines are reviewed at length with the patient today.  Concerns regarding medicines are outlined above.  No orders of the defined types were placed in this encounter.  Meds ordered this encounter  Medications   furosemide (LASIX) 40 MG tablet    Sig: Take 1 tablet (40 mg total) by mouth daily.    Dispense:  90 tablet    Refill:  3     Patient Instructions  Medication Instructions:  Please decrease your Furosemide to 40 mg a day. Continue all other medications as listed.  *If you need a refill on your cardiac medications before your next appointment, please call your pharmacy*  Follow-Up: At [facility], you and your health needs are our priority.  As part of our continuing mission to provide you with exceptional heart care, we have created designated Provider Care Teams.  These Care Teams include your primary Cardiologist (physician) and Advanced Practice Providers (APPs -  Physician Assistants and Nurse Practitioners) who all work together to provide you with the care you need, when you need it.  We recommend signing up for the patient portal called "MyChart".  Sign up information is provided on this After Visit Summary.  MyChart is used to connect with patients for Virtual Visits (Telemedicine).  Patients are able to view lab/test results, encounter notes, upcoming appointments, etc.  Non-urgent messages can be sent to your provider as well.   To learn more about what you can do with MyChart, go to [facility].    Your next appointment:   1 year(s)  The format for your next appointment:   In Person  Provider:   [name], MD   Thank you for choosing [facility]!!     Signed, [name], MD  07/27/2021 5:09 PM    [facility]

## 2021-08-02 NOTE — Progress Notes (Signed)
Remote pacemaker transmission.   

## 2021-08-08 ENCOUNTER — Other Ambulatory Visit: Payer: Self-pay

## 2021-08-08 ENCOUNTER — Encounter: Payer: Self-pay | Admitting: Internal Medicine

## 2021-08-08 ENCOUNTER — Ambulatory Visit: Payer: Medicare Other | Admitting: Internal Medicine

## 2021-08-08 VITALS — BP 122/70 | HR 64 | Ht 71.0 in | Wt 169.6 lb

## 2021-08-08 DIAGNOSIS — I441 Atrioventricular block, second degree: Secondary | ICD-10-CM

## 2021-08-08 DIAGNOSIS — Z950 Presence of cardiac pacemaker: Secondary | ICD-10-CM | POA: Diagnosis not present

## 2021-08-08 DIAGNOSIS — I480 Paroxysmal atrial fibrillation: Secondary | ICD-10-CM

## 2021-08-08 NOTE — Progress Notes (Signed)
HPI [name] returns today for ongoing evaluation and management of complete heart block and sinus node dysfunction, status post pacemaker insertion.  He has a history of chronic dyspnea with exertion though this has improved.  He has atrial fibrillation.  In the past he has refused systemic anticoagulation.  In addition there is been concerned about his advanced age.  He still eats too much salt though his daughter is working on this.  However he denies much in the way of heart failure symptoms. He had Covid and was in the hospital for 5 days. Allergies  Allergen Reactions   Tape Other (See Comments)    THE PATIENT'S SKIN IS VERY DELICATE- WILL TEAR!!     Current Outpatient Medications  Medication Sig Dispense Refill   albuterol (VENTOLIN HFA) 108 (90 Base) MCG/ACT inhaler Inhale 2 puffs into the lungs every 4 (four) hours as needed for wheezing or shortness of breath. 8 g 0   allopurinol (ZYLOPRIM) 100 MG tablet TAKE 2 TABLETS BY MOUTH EVERY DAY 180 tablet 3   aspirin 81 MG EC tablet Take 1 tablet (81 mg total) by mouth in the morning. 30 tablet 12   colchicine 0.6 MG tablet TAKE 1 TABLET (0.6 MG TOTAL) BY MOUTH 2 (TWO) TIMES DAILY AS NEEDED. 60 tablet 0   Cyanocobalamin 2500 MCG TABS Take 2,500 mcg by mouth daily.      donepezil (ARICEPT) 5 MG tablet Take 5 mg by mouth in the morning.     finasteride (PROSCAR) 5 MG tablet TAKE 1 TABLET BY MOUTH DAILY 90 tablet 3   furosemide (LASIX) 40 MG tablet Take 1 tablet (40 mg total) by mouth daily. 90 tablet 3   melatonin 5 MG TABS Take 5 mg by mouth at bedtime.     polyethylene glycol (MIRALAX / GLYCOLAX) 17 g packet Take 17 g by mouth in the morning.     tamsulosin (FLOMAX) 0.4 MG CAPS capsule Take 1 capsule (0.4 mg total) by mouth daily. 90 capsule 3   VITAMIN D3 SUPER STRENGTH 50 MCG (2000 UT) CAPS Take 2,000 Units by mouth in the morning.     No current facility-administered medications for this visit.     Past Medical History:   Diagnosis Date   Advance directive discussed with patient 07/15/2015   Atrial fibrillation (HCC) 01/23/2011   Benign prostatic hyperplasia (BPH) with urinary urgency 09/11/2007   Qualifier: Diagnosis of  By: Alphonsus Sias MD, Ronnette Hila    Dementia [facility])    Gout    Hyperlipidemia    Left inguinal hernia 03/29/2020   Mobitz type II atrioventricular block 01/25/2014   Pacemaker 04/27/2014   Pedal edema 09/10/2016   Routine general medical examination at a health care facility 07/01/2013   Scrotal right inuinal hernia 08/13/2019   Symptomatic bradycardia    s/p dual chamber PPM implantation April 2015 [facility])    ROS:   All systems reviewed and negative except as noted in the HPI.   Past Surgical History:  Procedure Laterality Date   CATARACT EXTRACTION     PACEMAKER INSERTION  01-25-2014   STJ Assurity dual chamber pacemaker implanted by Dr [name] for symptomatic bradycardia   PERMANENT PACEMAKER INSERTION N/A 01/25/2014   Procedure: PERMANENT PACEMAKER INSERTION;  Surgeon: Marinus Maw, MD;  Location: [facility] CATH LAB;  Service: Cardiovascular;  Laterality: N/A;   TONSILLECTOMY       Family History  Problem Relation Age of Onset   Cancer  Mother    Throat cancer Father      Social History   Socioeconomic History   Marital status: Widowed    Spouse name: Aadhav Uhlig   Number of children: 4   Years of education: Not on file   Highest education level: Not on file  Occupational History   Occupation: retired    Comment: [employer]  Tobacco Use   Smoking status: Never   Smokeless tobacco: Never  Vaping Use   Vaping Use: Never used  Substance and Sexual Activity   Alcohol use: Yes    Comment: rare etoh use   Drug use: No   Sexual activity: Not on file  Other Topics Concern   Not on file  Social History Narrative   Widowed 4/15   No living will   Health care power of attorney is son [name]. Daughter [name] is financial POA    He feels he is ready for  DNR---form done 08/13/19 (but he isn't sure he will put it up)   No tube feeds if cognitively unaware   Social Determinants of Health   Financial Resource Strain: Not on file  Food Insecurity: Not on file  Transportation Needs: Not on file  Physical Activity: Not on file  Stress: Not on file  Social Connections: Not on file  Intimate Partner Violence: Not on file     BP 122/70   Pulse 64   Ht 5\' 11"  (1.803 m)   Wt 169 lb 9.6 oz (76.9 kg)   SpO2 90%   BMI 23.65 kg/m   Physical Exam:  Well appearing NAD HEENT: Unremarkable Neck:  No JVD, no thyromegally Lymphatics:  No adenopathy Back:  No CVA tenderness Lungs:  Clear with no wheezes HEART:  Regular rate rhythm, no murmurs, no rubs, no clicks Abd:  soft, positive bowel sounds, no organomegally, no rebound, no guarding Ext:  2 plus pulses, no edema, no cyanosis, no clubbing Skin:  No rashes no nodules Neuro:  CN II through XII intact, motor grossly intact  DEVICE  Normal device function.  See PaceArt for details.   Assess/Plan:  1.  Persistent atrial fibrillation -the patient is asymptomatic.  I discussed the option of stopping aspirin and trying an oral anticoagulant.  I recommended this because of his risk of stroke and relatively low risk of bleeding, but he refuses. 2.  Complete heart block -he had a ventricular escape of 35 bpm today.  He is asymptomatic status post pacemaker insertion 3.  Pacemaker -interrogation of his [employer] dual-chamber pacemaker demonstrates normal device function. 4. Chronic diastolic heart failure -he appears euvolemic and I told his daughter to take and extra lasix if the weight goes above 170 lb.    , MD

## 2021-08-08 NOTE — Patient Instructions (Addendum)
Medication Instructions:  Your physician recommends that you continue on your current medications as directed. Please refer to the Current Medication list given to you today.  Labwork: None ordered.  Testing/Procedures: None ordered.  Follow-Up: Your physician wants you to follow-up in: one year with Lewayne Bunting, MD   Remote monitoring is used to monitor your Pacemaker from home. This monitoring reduces the number of office visits required to check your device to one time per year. It allows [place] to keep an eye on the functioning of your device to ensure it is working properly. You are scheduled for a device check from home on 10/23/20. You may send your transmission at any time that day. If you have a wireless device, the transmission will be sent automatically. After your physician reviews your transmission, you will receive a postcard with your next transmission date.  Any Other Special Instructions Will Be Listed Below (If Applicable).  If you need a refill on your cardiac medications before your next appointment, please call your pharmacy.

## 2021-08-30 ENCOUNTER — Encounter (HOSPITAL_COMMUNITY): Payer: Self-pay

## 2021-08-30 ENCOUNTER — Emergency Department (HOSPITAL_COMMUNITY)
Admission: EM | Admit: 2021-08-30 | Discharge: 2021-08-30 | Disposition: A | Discharge status: Home / Self Care | Payer: Medicare Other | Attending: Emergency Medicine | Admitting: Emergency Medicine

## 2021-08-30 ENCOUNTER — Other Ambulatory Visit: Payer: Self-pay

## 2021-08-30 ENCOUNTER — Emergency Department (HOSPITAL_COMMUNITY): Payer: Medicare Other

## 2021-08-30 DIAGNOSIS — Z950 Presence of cardiac pacemaker: Secondary | ICD-10-CM | POA: Diagnosis not present

## 2021-08-30 DIAGNOSIS — S0181XA Laceration without foreign body of other part of head, initial encounter: Secondary | ICD-10-CM

## 2021-08-30 DIAGNOSIS — W19XXXA Unspecified fall, initial encounter: Secondary | ICD-10-CM | POA: Diagnosis not present

## 2021-08-30 DIAGNOSIS — I5032 Chronic diastolic (congestive) heart failure: Secondary | ICD-10-CM | POA: Diagnosis not present

## 2021-08-30 DIAGNOSIS — Z79899 Other long term (current) drug therapy: Secondary | ICD-10-CM | POA: Diagnosis not present

## 2021-08-30 DIAGNOSIS — S01112A Laceration without foreign body of left eyelid and periocular area, initial encounter: Secondary | ICD-10-CM | POA: Insufficient documentation

## 2021-08-30 DIAGNOSIS — S0993XA Unspecified injury of face, initial encounter: Secondary | ICD-10-CM | POA: Diagnosis present

## 2021-08-30 DIAGNOSIS — Y9301 Activity, walking, marching and hiking: Secondary | ICD-10-CM | POA: Diagnosis not present

## 2021-08-30 DIAGNOSIS — Z7982 Long term (current) use of aspirin: Secondary | ICD-10-CM | POA: Diagnosis not present

## 2021-08-30 DIAGNOSIS — Z8616 Personal history of COVID-19: Secondary | ICD-10-CM | POA: Diagnosis not present

## 2021-08-30 DIAGNOSIS — N1831 Chronic kidney disease, stage 3a: Secondary | ICD-10-CM | POA: Diagnosis not present

## 2021-08-30 DIAGNOSIS — Z23 Encounter for immunization: Secondary | ICD-10-CM | POA: Insufficient documentation

## 2021-08-30 DIAGNOSIS — Y92512 Supermarket, store or market as the place of occurrence of the external cause: Secondary | ICD-10-CM | POA: Insufficient documentation

## 2021-08-30 DIAGNOSIS — F0390 Unspecified dementia without behavioral disturbance: Secondary | ICD-10-CM | POA: Diagnosis not present

## 2021-08-30 LAB — BASIC METABOLIC PANEL
Anion gap: 9 (ref 5–15)
BUN: 30 mg/dL — ABNORMAL HIGH (ref 8–23)
CO2: 27 mmol/L (ref 22–32)
Calcium: 8.8 mg/dL — ABNORMAL LOW (ref 8.9–10.3)
Chloride: 102 mmol/L (ref 98–111)
Creatinine, Ser: 1.17 mg/dL (ref 0.61–1.24)
GFR, Estimated: 56 mL/min — ABNORMAL LOW (ref >60)
Glucose, Bld: 106 mg/dL — ABNORMAL HIGH (ref 70–99)
Potassium: 4.4 mmol/L (ref 3.5–5.1)
Sodium: 138 mmol/L (ref 135–145)

## 2021-08-30 LAB — CBC
HCT: 35.2 % — ABNORMAL LOW (ref 39.0–52.0)
Hemoglobin: 11 g/dL — ABNORMAL LOW (ref 13.0–17.0)
MCH: 33.6 pg (ref 26.0–34.0)
MCHC: 31.3 g/dL (ref 30.0–36.0)
MCV: 107.6 fL — ABNORMAL HIGH (ref 80.0–100.0)
Platelets: 154 10*3/uL (ref 150–400)
RBC: 3.27 MIL/uL — ABNORMAL LOW (ref 4.22–5.81)
RDW: 16.2 % — ABNORMAL HIGH (ref 11.5–15.5)
WBC: 7.2 10*3/uL (ref 4.0–10.5)
nRBC: 0 % (ref 0.0–0.2)

## 2021-08-30 MED ORDER — LIDOCAINE-EPINEPHRINE (PF) 2 %-1:200000 IJ SOLN
10.0000 mL | Freq: Once | INTRAMUSCULAR | Status: DC
Start: 2021-08-30 — End: 2021-08-31

## 2021-08-30 MED ORDER — LIDOCAINE-EPINEPHRINE (PF) 2 %-1:200000 IJ SOLN
20.0000 mL | Freq: Once | INTRAMUSCULAR | Status: AC
  Administered 2021-08-30: 20 mL
  Filled 2021-08-30: qty 20

## 2021-08-30 MED ORDER — TETANUS-DIPHTH-ACELL PERTUSSIS 5-2.5-18.5 LF-MCG/0.5 IM SUSY
0.5000 mL | PREFILLED_SYRINGE | Freq: Once | INTRAMUSCULAR | Status: AC
  Administered 2021-08-30: 0.5 mL via INTRAMUSCULAR
  Filled 2021-08-30: qty 0.5

## 2021-08-30 NOTE — ED Provider Notes (Signed)
Forehead laceration repaired by me per request of Dr. Lynelle Doctor.  Marland Kitchen.Laceration Repair  Date/Time: 08/30/2021 10:31 PM Performed by: Fayrene Helper, PA-C Authorized by: Fayrene Helper, PA-C   Consent:    Consent obtained:  Verbal   Consent given by:  Patient   Risks discussed:  Infection, need for additional repair, pain, poor cosmetic result and poor wound healing   Alternatives discussed:  No treatment and delayed treatment Universal protocol:    Procedure explained and questions answered to patient or proxy's satisfaction: yes     Relevant documents present and verified: yes     Test results available: yes     Imaging studies available: yes     Required blood products, implants, devices, and special equipment available: yes     Site/side marked: yes     Immediately prior to procedure, a time out was called: yes     Patient identity confirmed:  Verbally with patient Anesthesia:    Anesthesia method:  Local infiltration   Local anesthetic:  Lidocaine 1% WITH epi Laceration details:    Location:  Face   Face location:  Forehead   Length (cm):  4 (stellate shape laceration)   Depth (mm):  3 Pre-procedure details:    Preparation:  Patient was prepped and draped in usual sterile fashion and imaging obtained to evaluate for foreign bodies Exploration:    Limited defect created (wound extended): no     Hemostasis achieved with:  Epinephrine   Imaging outcome: foreign body not noted     Wound exploration: wound explored through full range of motion and entire depth of wound visualized     Contaminated: no   Treatment:    Area cleansed with:  Povidone-iodine and saline   Amount of cleaning:  Standard   Irrigation solution:  Sterile saline   Irrigation method:  Pressure wash   Visualized foreign bodies/material removed: no     Debridement:  None   Undermining:  None Skin repair:    Repair method:  Sutures   Suture size:  5-0   Suture material:  Prolene   Suture technique:  Simple  interrupted   Number of sutures:  6 Approximation:    Approximation:  Close Repair type:    Repair type:  Intermediate Post-procedure details:    Dressing:  Non-adherent dressing   Procedure completion:  Tolerated well, no immediate complications    Fayrene Helper, PA-C 08/30/21 2233    Linwood Dibbles, MD 09/03/21 [phone]

## 2021-08-30 NOTE — ED Notes (Signed)
Provider at bedside performing wound repair

## 2021-08-30 NOTE — ED Notes (Signed)
Provider at bedside

## 2021-08-30 NOTE — ED Notes (Signed)
Pt had a witness fall. Noted abrasions to lt hand and bruising to lt elbow, bruising to bilat knees. Laceration above lt eye with dressing in place at this time

## 2021-08-30 NOTE — ED Triage Notes (Signed)
Pt had a fall mechanical fall tonight, hit head, unknown LOC, hematoma to top of head, lac to forehead

## 2021-08-30 NOTE — ED Provider Notes (Signed)
Emergency Medicine Provider Triage Evaluation Note  Matthew Howard , a 87 y.o. male  was evaluated in triage.  Pt complains of mechanical fall while getting out of a car pta. Head injury. Unknown loc. lac  Review of Systems  Positive: Head injury Negative: loc  Physical Exam  BP (!) 135/53 (BP Location: Right Arm)   Pulse 69   Temp 97.7 F (36.5 C) (Oral)   Resp 17   SpO2 98%  Gen:   Awake, no distress   Resp:  Normal effort  MSK:   Moves extremities without difficulty  Other:  laceration to the left eyebrow, clear speech, no facial droop, 5/5 strenght to bue/ble  Medical Decision Making  Medically screening exam initiated at 7:54 PM.  Appropriate orders placed.  Matthew Howard was informed that the remainder of the evaluation will be completed by another provider, this initial triage assessment does not replace that evaluation, and the importance of remaining in the ED until their evaluation is complete.     Matthew Howard 08/30/21 1955    [name], MD 08/30/21 2137

## 2021-08-30 NOTE — ED Provider Notes (Signed)
[facility] EMERGENCY DEPARTMENT Provider Note   CSN: [id number] Arrival date & time: 08/30/21  1757     History Chief Complaint  Patient presents with   Matthew Howard is a 87 y.o. male.   Fall   Patient presents to the emergency room for evaluation after a fall.  Patient has a history of gait instability and frequent falls.  He was walking with a family member to the store.  They were opening the door and the patient ended up falling hitting his head on the ground.  Patient struck his head on concrete and sustained a laceration.  Unclear if there is loss of consciousness but there is a laceration above his left eyebrow.  Patient himself denies any complaints.  He is not having trouble with headache.  No neck pain.  No trouble with chest pain or shortness of breath.  No numbness or weakness  Past Medical History:  Diagnosis Date   Advance directive discussed with patient 07/15/2015   Atrial fibrillation ([place]) 01/23/2011   Benign prostatic hyperplasia (BPH) with urinary urgency 09/11/2007   Qualifier: Diagnosis of  By: Silvio Pate MD, [facility]    Dementia [facility])    Gout    Hyperlipidemia    Left inguinal hernia 03/29/2020   Mobitz type II atrioventricular block 01/25/2014   Pacemaker 04/27/2014   Pedal edema 09/10/2016   Routine general medical examination at a health care facility 07/01/2013   Scrotal right inuinal hernia 08/13/2019   Symptomatic bradycardia    s/p dual chamber PPM implantation April 2015 ([place])    Patient Active Problem List   Diagnosis Date Noted   COVID-19 virus infection 07/02/2021   Elevated troponin 07/02/2021   Falls frequently 06/01/2021   Stage 3a chronic kidney disease ([place]) 06/01/2021   Pneumothorax 05/05/2021   Gout of left knee 10/13/2020   Left leg pain 09/12/2020   Vascular dementia, uncomplicated ([place]) 0000000   Chronic diastolic heart failure ([place]) 06/21/2020   Urinary frequency 06/15/2020    Fatigue 06/07/2020   Symptomatic bradycardia 05/30/2020   Left inguinal hernia 03/29/2020   Scrotal right inuinal hernia 08/13/2019   Pedal edema 09/10/2016   Advance directive discussed with patient 07/15/2015   Pacemaker 04/27/2014   Mobitz type II atrioventricular block 01/25/2014   Routine general medical examination at a health care facility 07/01/2013   Atrial fibrillation ([place]) 01/23/2011   Gout 09/11/2007   Benign prostatic hyperplasia (BPH) with urinary urgency 09/11/2007    Past Surgical History:  Procedure Laterality Date   CATARACT EXTRACTION     PACEMAKER INSERTION  01-25-2014   STJ Assurity dual chamber pacemaker implanted by Dr Lovena Le for symptomatic bradycardia   PERMANENT PACEMAKER INSERTION N/A 01/25/2014   Procedure: PERMANENT PACEMAKER INSERTION;  Surgeon: Evans Lance, MD;  [facility] C[facility] LAB;  Service: Cardiovascular;  Laterality: N/A;   TONSILLECTOMY         Family History  Problem Relation Age of Onset   Cancer Mother    Throat cancer Father     Social History   Tobacco Use   Smoking status: Never   Smokeless tobacco: Never  Vaping Use   Vaping Use: Never used  Substance Use Topics   Alcohol use: Yes    Comment: rare etoh use   Drug use: No    Home Medications Prior to Admission medications   Medication Sig Start Date End Date Taking? Authorizing Provider  albuterol (VENTOLIN HFA) 108 (  90 Base) MCG/ACT inhaler Inhale 2 puffs into the lungs every 4 (four) hours as needed for wheezing or shortness of breath. 05/06/21   Glade Lloyd, MD  allopurinol (ZYLOPRIM) 100 MG tablet TAKE 2 TABLETS BY MOUTH EVERY DAY 06/14/21   [name], MD  aspirin 81 MG EC tablet Take 1 tablet (81 mg total) by mouth in the morning. 07/13/21   Leroy Sea, MD  colchicine 0.6 MG tablet TAKE 1 TABLET (0.6 MG TOTAL) BY MOUTH 2 (TWO) TIMES DAILY AS NEEDED. 10/29/20   [name], MD  Cyanocobalamin 2500 MCG TABS Take 2,500 mcg by mouth daily.      [provider]  donepezil (ARICEPT) 5 MG tablet Take 5 mg by mouth in the morning.    [provider]  finasteride (PROSCAR) 5 MG tablet TAKE 1 TABLET BY MOUTH DAILY 09/12/16   [name], MD  furosemide (LASIX) 40 MG tablet Take 1 tablet (40 mg total) by mouth daily. 07/27/21   Jake Bathe, MD  melatonin 5 MG TABS Take 5 mg by mouth at bedtime.    [provider]  polyethylene glycol (MIRALAX / GLYCOLAX) 17 g packet Take 17 g by mouth in the morning.    [provider]  tamsulosin (FLOMAX) 0.4 MG CAPS capsule Take 1 capsule (0.4 mg total) by mouth daily. 07/12/21   [name], MD  VITAMIN D3 SUPER STRENGTH 50 MCG (2000 UT) CAPS Take 2,000 Units by mouth in the morning.    [provider]    Allergies    Tape  Review of Systems   Review of Systems  All other systems reviewed and are negative.  Physical Exam Updated Vital Signs BP (!) 144/62 (BP Location: Left Arm)   Pulse 70   Temp 98.2 F (36.8 C) (Oral)   Resp 16   Ht 1.829 m (6')   Wt 78.9 kg   SpO2 100%   BMI 23.60 kg/m   Physical Exam Vitals and nursing note reviewed.  Constitutional:      General: He is not in acute distress.    Appearance: He is well-developed.  HENT:     Head: Normocephalic.     Comments: Irregular laceration above the left eyebrow    Right Ear: External ear normal.     Left Ear: External ear normal.  Eyes:     General: No scleral icterus.       Right eye: No discharge.        Left eye: No discharge.     Conjunctiva/sclera: Conjunctivae normal.  Neck:     Trachea: No tracheal deviation.  Cardiovascular:     Rate and Rhythm: Normal rate and regular rhythm.  Pulmonary:     Effort: Pulmonary effort is normal. No respiratory distress.     Breath sounds: Normal breath sounds. No stridor. No wheezing or rales.  Abdominal:     General: Bowel sounds are normal. There is no distension.     Palpations: Abdomen is soft.     Tenderness:  There is no abdominal tenderness. There is no guarding or rebound.  Musculoskeletal:        General: No tenderness or deformity.     Cervical back: Neck supple.     Comments: No tenderness palpation in upper extremities or lower extremities,  Skin:    General: Skin is warm and dry.     Findings: No rash.  Neurological:     General: No focal  deficit present.     Mental Status: He is alert.     Cranial Nerves: No cranial nerve deficit (no facial droop, extraocular movements intact, no slurred speech).     Sensory: No sensory deficit.     Motor: No abnormal muscle tone or seizure activity.     Coordination: Coordination normal.     Comments: Able to move arms and legs without difficulty, no facial droop, speech is clear  Psychiatric:        Mood and Affect: Mood normal.    ED Results / Procedures / Treatments   Labs (all labs ordered are listed, but only abnormal results are displayed) Labs Reviewed  CBC - Abnormal; Notable for the following components:      Result Value   RBC 3.27 (*)    Hemoglobin 11.0 (*)    HCT 35.2 (*)    MCV 107.6 (*)    RDW 16.2 (*)    All other components within normal limits  BASIC METABOLIC PANEL - Abnormal; Notable for the following components:   Glucose, Bld 106 (*)    BUN 30 (*)    Calcium 8.8 (*)    GFR, Estimated 56 (*)    All other components within normal limits    EKG None  Radiology CT Head Wo Contrast  Result Date: 08/30/2021 CLINICAL DATA:  Status post fall EXAM: CT HEAD WITHOUT CONTRAST CT MAXILLOFACIAL WITHOUT CONTRAST CT CERVICAL SPINE WITHOUT CONTRAST TECHNIQUE: Multidetector CT imaging of the head, cervical spine, and maxillofacial structures were performed using the standard protocol without intravenous contrast. Multiplanar CT image reconstructions of the cervical spine and maxillofacial structures were also generated. COMPARISON:  CT head 07/02/2021 FINDINGS: CT HEAD FINDINGS BRAIN: BRAIN Cerebral ventricle sizes are concordant  with the degree of cerebral volume loss. Patchy and confluent areas of decreased attenuation are noted throughout the deep and periventricular white matter of the cerebral hemispheres bilaterally, compatible with chronic microvascular ischemic disease. No evidence of large-territorial acute infarction. No parenchymal hemorrhage. No mass lesion. No extra-axial collection. No mass effect or midline shift. No hydrocephalus. Basilar cisterns are patent. Vascular: No hyperdense vessel. Atherosclerotic calcifications are present within the cavernous internal carotid arteries. Skull: No acute fracture or focal lesion. Other: None. CT MAXILLOFACIAL FINDINGS Osseous: No fracture or mandibular dislocation. No destructive process. Sinuses/Orbits: Paranasal sinuses and mastoid air cells are clear. Bilateral lens replacement. Otherwise orbits are unremarkable. Soft tissues: Negative. CT CERVICAL SPINE FINDINGS Alignment: Normal. Skull base and vertebrae: Multilevel degenerative changes of the spine. No acute fracture. No aggressive appearing focal osseous lesion or focal pathologic process. Soft tissues and spinal canal: No prevertebral fluid or swelling. No visible canal hematoma. Upper chest: Left pleural effusion. Other: None. IMPRESSION: 1. No acute intracranial abnormality. 2. No acute displaced facial fracture. 3. No acute displaced fracture or traumatic listhesis of the cervical spine. 4. Left pleural effusion.  Correlate with chest x-ray. Electronically Signed   By: Iven Finn M.D.   On: 08/30/2021 20:42   CT Cervical Spine Wo Contrast  Result Date: 08/30/2021 CLINICAL DATA:  Status post fall EXAM: CT HEAD WITHOUT CONTRAST CT MAXILLOFACIAL WITHOUT CONTRAST CT CERVICAL SPINE WITHOUT CONTRAST TECHNIQUE: Multidetector CT imaging of the head, cervical spine, and maxillofacial structures were performed using the standard protocol without intravenous contrast. Multiplanar CT image reconstructions of the cervical  spine and maxillofacial structures were also generated. COMPARISON:  CT head 07/02/2021 FINDINGS: CT HEAD FINDINGS BRAIN: BRAIN Cerebral ventricle sizes are concordant with the degree of  cerebral volume loss. Patchy and confluent areas of decreased attenuation are noted throughout the deep and periventricular white matter of the cerebral hemispheres bilaterally, compatible with chronic microvascular ischemic disease. No evidence of large-territorial acute infarction. No parenchymal hemorrhage. No mass lesion. No extra-axial collection. No mass effect or midline shift. No hydrocephalus. Basilar cisterns are patent. Vascular: No hyperdense vessel. Atherosclerotic calcifications are present within the cavernous internal carotid arteries. Skull: No acute fracture or focal lesion. Other: None. CT MAXILLOFACIAL FINDINGS Osseous: No fracture or mandibular dislocation. No destructive process. Sinuses/Orbits: Paranasal sinuses and mastoid air cells are clear. Bilateral lens replacement. Otherwise orbits are unremarkable. Soft tissues: Negative. CT CERVICAL SPINE FINDINGS Alignment: Normal. Skull base and vertebrae: Multilevel degenerative changes of the spine. No acute fracture. No aggressive appearing focal osseous lesion or focal pathologic process. Soft tissues and spinal canal: No prevertebral fluid or swelling. No visible canal hematoma. Upper chest: Left pleural effusion. Other: None. IMPRESSION: 1. No acute intracranial abnormality. 2. No acute displaced facial fracture. 3. No acute displaced fracture or traumatic listhesis of the cervical spine. 4. Left pleural effusion.  Correlate with chest x-ray. Electronically Signed   By: Iven Finn M.D.   On: 08/30/2021 20:42   CT Maxillofacial Wo Contrast  Result Date: 08/30/2021 CLINICAL DATA:  Status post fall EXAM: CT HEAD WITHOUT CONTRAST CT MAXILLOFACIAL WITHOUT CONTRAST CT CERVICAL SPINE WITHOUT CONTRAST TECHNIQUE: Multidetector CT imaging of the head, cervical  spine, and maxillofacial structures were performed using the standard protocol without intravenous contrast. Multiplanar CT image reconstructions of the cervical spine and maxillofacial structures were also generated. COMPARISON:  CT head 07/02/2021 FINDINGS: CT HEAD FINDINGS BRAIN: BRAIN Cerebral ventricle sizes are concordant with the degree of cerebral volume loss. Patchy and confluent areas of decreased attenuation are noted throughout the deep and periventricular white matter of the cerebral hemispheres bilaterally, compatible with chronic microvascular ischemic disease. No evidence of large-territorial acute infarction. No parenchymal hemorrhage. No mass lesion. No extra-axial collection. No mass effect or midline shift. No hydrocephalus. Basilar cisterns are patent. Vascular: No hyperdense vessel. Atherosclerotic calcifications are present within the cavernous internal carotid arteries. Skull: No acute fracture or focal lesion. Other: None. CT MAXILLOFACIAL FINDINGS Osseous: No fracture or mandibular dislocation. No destructive process. Sinuses/Orbits: Paranasal sinuses and mastoid air cells are clear. Bilateral lens replacement. Otherwise orbits are unremarkable. Soft tissues: Negative. CT CERVICAL SPINE FINDINGS Alignment: Normal. Skull base and vertebrae: Multilevel degenerative changes of the spine. No acute fracture. No aggressive appearing focal osseous lesion or focal pathologic process. Soft tissues and spinal canal: No prevertebral fluid or swelling. No visible canal hematoma. Upper chest: Left pleural effusion. Other: None. IMPRESSION: 1. No acute intracranial abnormality. 2. No acute displaced facial fracture. 3. No acute displaced fracture or traumatic listhesis of the cervical spine. 4. Left pleural effusion.  Correlate with chest x-ray. Electronically Signed   By: Iven Finn M.D.   On: 08/30/2021 20:42    Procedures Procedures   Medications Ordered in ED Medications   lidocaine-EPINEPHrine (XYLOCAINE W/EPI) 2 %-1:200000 (PF) injection 10 mL (10 mLs Infiltration Not Given 08/30/21 2154)  Tdap (BOOSTRIX) injection 0.5 mL (0.5 mLs Intramuscular Given 08/30/21 2143)  lidocaine-EPINEPHrine (XYLOCAINE W/EPI) 2 %-1:200000 (PF) injection 20 mL (20 mLs Infiltration Given 08/30/21 2154)    ED Course  I have reviewed the triage vital signs and the nursing notes.  Pertinent labs & imaging results that were available during my care of the patient were reviewed by me and considered in my  medical decision making (see chart for details).  Clinical Course as of 08/30/21 2232  Wed Aug 30, 2021  2135 CT of his head C-spine and maxillofacial without acute fractures [JK]  2152 CBC shows stable anemia [JK]  2152 Electrolyte panel without significant abnormalities [JK]    Clinical Course User Index [JK] Dorie Rank, MD   MDM Rules/Calculators/A&P                           Patient presented to the ED for evaluation of a fall.  Patient had a laceration of the face.  CT scan was performed to evaluate for possible subarachnoid hemorrhage or subdural.  CT without acute findings.  No facial bone fractures.  No cervical spine fracture.  CBC metabolic panel performed.  No signs of severe dehydration or anemia.  A suspect patient had a mechanical fall.  Stable for discharge in the care of family Final Clinical Impression(s) / ED Diagnoses Final diagnoses:  Fall, initial encounter  Facial laceration, initial encounter    Rx / DC Orders ED Discharge Orders     None        Dorie Rank, MD 08/30/21 2232

## 2021-08-30 NOTE — Discharge Instructions (Addendum)
Apply antibiotic ointment to the wound, return to the ED for vomiting confusion severe headache

## 2021-09-05 ENCOUNTER — Encounter: Payer: Self-pay | Admitting: Family Medicine

## 2021-09-05 ENCOUNTER — Ambulatory Visit: Payer: Medicare Other | Admitting: Family Medicine

## 2021-09-05 ENCOUNTER — Other Ambulatory Visit: Payer: Self-pay

## 2021-09-05 VITALS — BP 130/58 | HR 72 | Temp 97.9°F | Ht 71.0 in | Wt 170.0 lb

## 2021-09-05 DIAGNOSIS — S0181XD Laceration without foreign body of other part of head, subsequent encounter: Secondary | ICD-10-CM

## 2021-09-05 DIAGNOSIS — W19XXXD Unspecified fall, subsequent encounter: Secondary | ICD-10-CM

## 2021-09-05 DIAGNOSIS — W19XXXA Unspecified fall, initial encounter: Secondary | ICD-10-CM | POA: Insufficient documentation

## 2021-09-05 DIAGNOSIS — S0181XA Laceration without foreign body of other part of head, initial encounter: Secondary | ICD-10-CM | POA: Insufficient documentation

## 2021-09-05 NOTE — Assessment & Plan Note (Signed)
Well healing. No sig of infection.  No S/S of intracranial bleed.   Procedure note: Cleaned area with alcohol. 6 interrupted sutures removed with minimal bleeding. 3 steri strips placed. No complications.

## 2021-09-05 NOTE — Progress Notes (Signed)
Patient ID: Matthew Howard, male    DOB: 1919/07/20, 87 y.o.   MRN: [id number]  This visit was conducted in person.  Pulse 72   Temp 97.9 F (36.6 C) (Temporal)   Ht 5\' 11"  (1.803 m)   Wt 170 lb (77.1 kg)   SpO2 99%   BMI 23.71 kg/m    CC:  Chief Complaint  Patient presents with   Suture / Staple Removal    Subjective:   HPI: Matthew Howard is a 87 y.o. male  patient of Dr. 110 presenting on 09/05/2021 for Suture / Staple Removal  Reviewed ER visit from 08/30/2021  Patient has a history of gait instability and frequent falls.  He was walking with a family member to the store.  They were opening the door and the patient ended up falling hitting his head on the ground.  Patient struck his head on concrete and sustained a laceration.  Unclear if there is loss of consciousness  CT scan was performed to evaluate for possible subarachnoid hemorrhage or subdural.  CT without acute findings.  No facial bone fractures.  No cervical spine fracture.  CBC metabolic panel performed.  No signs of severe dehydration or anemia.   Forehead laceration 6 prolene simple interrupted sutures placed    Today patient presents with   daughter in law.   He denies head pain, no dizziness. No Chest  pain.  He has healing abrasion on left elbow.   BP Readings from Last 3 Encounters:  09/05/21 (!) 130/58  08/30/21 (!) 144/62  08/08/21 122/70    Relevant past medical, surgical, family and social history reviewed and updated as indicated. Interim medical history since our last visit reviewed. Allergies and medications reviewed and updated. Outpatient Medications Prior to Visit  Medication Sig Dispense Refill   albuterol (VENTOLIN HFA) 108 (90 Base) MCG/ACT inhaler Inhale 2 puffs into the lungs every 4 (four) hours as needed for wheezing or shortness of breath. 8 g 0   allopurinol (ZYLOPRIM) 100 MG tablet TAKE 2 TABLETS BY MOUTH EVERY DAY 180 tablet 3   aspirin 81 MG EC tablet Take 1  tablet (81 mg total) by mouth in the morning. 30 tablet 12   colchicine 0.6 MG tablet TAKE 1 TABLET (0.6 MG TOTAL) BY MOUTH 2 (TWO) TIMES DAILY AS NEEDED. 60 tablet 0   Cyanocobalamin 2500 MCG TABS Take 2,500 mcg by mouth daily.      donepezil (ARICEPT) 5 MG tablet Take 5 mg by mouth in the morning.     finasteride (PROSCAR) 5 MG tablet TAKE 1 TABLET BY MOUTH DAILY 90 tablet 3   furosemide (LASIX) 40 MG tablet Take 1 tablet (40 mg total) by mouth daily. 90 tablet 3   melatonin 5 MG TABS Take 5 mg by mouth at bedtime.     polyethylene glycol (MIRALAX / GLYCOLAX) 17 g packet Take 17 g by mouth in the morning.     tamsulosin (FLOMAX) 0.4 MG CAPS capsule Take 1 capsule (0.4 mg total) by mouth daily. 90 capsule 3   VITAMIN D3 SUPER STRENGTH 50 MCG (2000 UT) CAPS Take 2,000 Units by mouth in the morning.     No facility-administered medications prior to visit.     Per HPI unless specifically indicated in ROS section below Review of Systems  Constitutional:  Negative for fatigue and fever.  HENT:  Negative for ear pain.   Eyes:  Negative for pain.  Respiratory:  Negative for cough  and shortness of breath.   Cardiovascular:  Negative for chest pain, palpitations and leg swelling.  Gastrointestinal:  Negative for abdominal pain.  Genitourinary:  Negative for dysuria.  Musculoskeletal:  Negative for arthralgias.  Neurological:  Negative for syncope, light-headedness and headaches.  Psychiatric/Behavioral:  Negative for dysphoric mood.   Objective:  Pulse 72   Temp 97.9 F (36.6 C) (Temporal)   Ht 5\' 11"  (1.803 m)   Wt 170 lb (77.1 kg)   SpO2 99%   BMI 23.71 kg/m   Wt Readings from Last 3 Encounters:  09/05/21 170 lb (77.1 kg)  08/30/21 174 lb (78.9 kg)  08/08/21 169 lb 9.6 oz (76.9 kg)      Physical Exam Constitutional:      Appearance: He is well-developed.  HENT:     Head: Normocephalic.     Right Ear: Hearing normal.     Left Ear: Hearing normal.     Nose: Nose normal.   Neck:     Thyroid: No thyroid mass or thyromegaly.     Vascular: No carotid bruit.     Trachea: Trachea normal.  Cardiovascular:     Rate and Rhythm: Normal rate and regular rhythm.     Pulses: Normal pulses.     Heart sounds: Heart sounds not distant. No murmur heard.   No friction rub. No gallop.     Comments: No peripheral edema Pulmonary:     Effort: Pulmonary effort is normal. No respiratory distress.     Breath sounds: Normal breath sounds.  Skin:    General: Skin is warm and dry.     Findings: No rash.  Neurological:     Mental Status: He is alert. Mental status is at baseline. He is confused.  Psychiatric:        Speech: He is noncommunicative.        Behavior: Behavior normal.        Thought Content: Thought content normal.      Results for orders placed or performed during the hospital encounter of 08/30/21  CBC  Result Value Ref Range   WBC 7.2 4.0 - 10.5 K/uL   RBC 3.27 (L) 4.22 - 5.81 MIL/uL   Hemoglobin 11.0 (L) 13.0 - 17.0 g/dL   HCT 35.2 (L) 39.0 - 52.0 %   MCV 107.6 (H) 80.0 - 100.0 fL   MCH 33.6 26.0 - 34.0 pg   MCHC 31.3 30.0 - 36.0 g/dL   RDW 16.2 (H) 11.5 - 15.5 %   Platelets 154 150 - 400 K/uL   nRBC 0.0 0.0 - 0.2 %  Basic metabolic panel  Result Value Ref Range   Sodium 138 135 - 145 mmol/L   Potassium 4.4 3.5 - 5.1 mmol/L   Chloride 102 98 - 111 mmol/L   CO2 27 22 - 32 mmol/L   Glucose, Bld 106 (H) 70 - 99 mg/dL   BUN 30 (H) 8 - 23 mg/dL   Creatinine, Ser 1.17 0.61 - 1.24 mg/dL   Calcium 8.8 (L) 8.9 - 10.3 mg/dL   GFR, Estimated 56 (L) >60 mL/min   Anion gap 9 5 - 15    This visit occurred during the SARS-CoV-2 public health emergency.  Safety protocols were in place, including screening questions prior to the visit, additional usage of staff PPE, and extensive cleaning of exam room while observing appropriate contact time as indicated for disinfecting solutions.   COVID 19 screen:  No recent travel or known exposure to COVID19 The  patient denies respiratory symptoms of COVID 19 at this time. The importance of social distancing was discussed today.   Assessment and Plan    Problem List Items Addressed This Visit     Accidental fall - Primary     No clear cause of fall other than balance, age and weakness.      Facial laceration    Well healing. No sig of infection.  No S/S of intracranial bleed.   Procedure note: Cleaned area with alcohol. 6 interrupted sutures removed with minimal bleeding. 3 steri strips placed. No complications.        Eliezer Lofts, MD

## 2021-09-05 NOTE — Assessment & Plan Note (Signed)
No clear cause of fall other than balance, age and weakness.

## 2021-09-06 ENCOUNTER — Ambulatory Visit: Payer: Medicare Other | Admitting: Cardiology

## 2021-09-11 ENCOUNTER — Other Ambulatory Visit: Payer: Self-pay | Admitting: Interventional Cardiology

## 2021-10-23 ENCOUNTER — Ambulatory Visit (INDEPENDENT_AMBULATORY_CARE_PROVIDER_SITE_OTHER): Payer: Medicare Other

## 2021-10-23 DIAGNOSIS — I441 Atrioventricular block, second degree: Secondary | ICD-10-CM | POA: Diagnosis not present

## 2021-10-24 LAB — CUP PACEART REMOTE DEVICE CHECK
Battery Remaining Longevity: 17 mo
Battery Remaining Percentage: 15 %
Battery Voltage: 2.87 V
Brady Statistic AP VP Percent: 0 %
Brady Statistic AP VS Percent: 0 %
Brady Statistic AS VP Percent: 85 %
Brady Statistic AS VS Percent: 13 %
Brady Statistic RA Percent Paced: 0 %
Brady Statistic RV Percent Paced: 97 %
Date Time Interrogation Session: 20230116105853
Implantable Lead Implant Date: 20150420
Implantable Lead Implant Date: 20150420
Implantable Lead Location: 753859
Implantable Lead Location: 753860
Implantable Pulse Generator Implant Date: 20150420
Lead Channel Impedance Value: 440 Ohm
Lead Channel Impedance Value: 480 Ohm
Lead Channel Pacing Threshold Amplitude: 0.5 V
Lead Channel Pacing Threshold Amplitude: 1 V
Lead Channel Pacing Threshold Pulse Width: 0.4 ms
Lead Channel Pacing Threshold Pulse Width: 0.4 ms
Lead Channel Sensing Intrinsic Amplitude: 11.5 mV
Lead Channel Sensing Intrinsic Amplitude: 2.2 mV
Lead Channel Setting Pacing Amplitude: 2 V
Lead Channel Setting Pacing Amplitude: 2.5 V
Lead Channel Setting Pacing Pulse Width: 0.4 ms
Lead Channel Setting Sensing Sensitivity: 2 mV
Pulse Gen Model: 2240
Pulse Gen Serial Number: 3013717

## 2021-11-06 NOTE — Progress Notes (Signed)
Remote pacemaker transmission.   

## 2021-11-20 ENCOUNTER — Other Ambulatory Visit: Payer: Self-pay | Admitting: Interventional Cardiology

## 2021-11-20 ENCOUNTER — Other Ambulatory Visit: Payer: Self-pay | Admitting: Cardiology

## 2022-01-22 ENCOUNTER — Ambulatory Visit (INDEPENDENT_AMBULATORY_CARE_PROVIDER_SITE_OTHER): Payer: Medicare Other

## 2022-01-22 DIAGNOSIS — I441 Atrioventricular block, second degree: Secondary | ICD-10-CM | POA: Diagnosis not present

## 2022-01-23 LAB — CUP PACEART REMOTE DEVICE CHECK
Battery Remaining Longevity: 14 mo
Battery Remaining Percentage: 12 %
Battery Voltage: 2.86 V
Brady Statistic AP VP Percent: 40 %
Brady Statistic AP VS Percent: 1 %
Brady Statistic AS VP Percent: 59 %
Brady Statistic AS VS Percent: 1 %
Brady Statistic RA Percent Paced: 1 %
Brady Statistic RV Percent Paced: 97 %
Date Time Interrogation Session: 20230417163747
Implantable Lead Implant Date: 20150420
Implantable Lead Implant Date: 20150420
Implantable Lead Location: 753859
Implantable Lead Location: 753860
Implantable Pulse Generator Implant Date: 20150420
Lead Channel Impedance Value: 410 Ohm
Lead Channel Impedance Value: 460 Ohm
Lead Channel Pacing Threshold Amplitude: 0.5 V
Lead Channel Pacing Threshold Amplitude: 1 V
Lead Channel Pacing Threshold Pulse Width: 0.4 ms
Lead Channel Pacing Threshold Pulse Width: 0.4 ms
Lead Channel Sensing Intrinsic Amplitude: 1.6 mV
Lead Channel Sensing Intrinsic Amplitude: 9.3 mV
Lead Channel Setting Pacing Amplitude: 2 V
Lead Channel Setting Pacing Amplitude: 2.5 V
Lead Channel Setting Pacing Pulse Width: 0.4 ms
Lead Channel Setting Sensing Sensitivity: 2 mV
Pulse Gen Model: 2240
Pulse Gen Serial Number: 3013717

## 2022-02-01 ENCOUNTER — Encounter: Payer: Self-pay | Admitting: Internal Medicine

## 2022-02-01 ENCOUNTER — Ambulatory Visit: Payer: Medicare Other | Admitting: Internal Medicine

## 2022-02-01 VITALS — BP 110/60 | HR 56 | Temp 97.2°F | Ht 71.0 in | Wt 170.8 lb

## 2022-02-01 DIAGNOSIS — I480 Paroxysmal atrial fibrillation: Secondary | ICD-10-CM | POA: Diagnosis not present

## 2022-02-01 DIAGNOSIS — N1831 Chronic kidney disease, stage 3a: Secondary | ICD-10-CM

## 2022-02-01 DIAGNOSIS — M1000 Idiopathic gout, unspecified site: Secondary | ICD-10-CM

## 2022-02-01 DIAGNOSIS — F0150 Vascular dementia without behavioral disturbance: Secondary | ICD-10-CM

## 2022-02-01 DIAGNOSIS — I5032 Chronic diastolic (congestive) heart failure: Secondary | ICD-10-CM

## 2022-02-01 MED ORDER — DONEPEZIL HCL 5 MG PO TABS: 5.0000 mg | ORAL_TABLET | Freq: Every morning | ORAL | 3 refills | Status: AC

## 2022-02-01 MED ORDER — ALLOPURINOL 300 MG PO TABS: 300.0000 mg | ORAL_TABLET | Freq: Every day | ORAL | 3 refills | Status: AC

## 2022-02-01 NOTE — Assessment & Plan Note (Signed)
Has had frequent attacks which a few days of colchicine helps ?Last uric acid was 6.5----will increase the allopurinol from 200 to 300 mg ?

## 2022-02-01 NOTE — Assessment & Plan Note (Signed)
GFR stable ?No ACEI?ARB due to fainting spells ?

## 2022-02-01 NOTE — Progress Notes (Signed)
? ?Subjective:  ? ? Patient ID: Matthew Howard, male    DOB: 09/29/1919, 88 y.o.   MRN: [id number] ? ?HPI ?Here with daughter in law due to several concerns ? ?Doing pretty well ?Did faint in the shower 2-3 weeks ago--this happens every 2 months or so ?He will bend down and knees sag---and he goes down (even if they get him out, he will pass out) ?He doesn't remember these events ?Small shower---so no room to put chair in ? ?He also gets some wheezing when bending over---like after being in shower ?DIL notes "puffing" but he denies sig SOB ?Does seem to breath fast after exertion like showering ?Currently DIL gives furosemide 80 daily--then she adds a second dose of 40mg  if her weight is up over 171# ?When on daily lasix 160 daily---tended to get dehydrated ? ?Does get gout every few weeks ?DIL gives colchicine with effectiveness ? ?GFR still in 50's ? ?Current Outpatient Medications on File Prior to Visit  ?Medication Sig Dispense Refill  ? allopurinol (ZYLOPRIM) 100 MG tablet TAKE 2 TABLETS BY MOUTH EVERY DAY 180 tablet 3  ? aspirin 81 MG EC tablet Take 1 tablet (81 mg total) by mouth in the morning. 30 tablet 12  ? cetirizine (ZYRTEC) 10 MG tablet Take 10 mg by mouth daily.    ? colchicine 0.6 MG tablet TAKE 1 TABLET (0.6 MG TOTAL) BY MOUTH 2 (TWO) TIMES DAILY AS NEEDED. 60 tablet 0  ? Cyanocobalamin 2500 MCG TABS Take 2,500 mcg by mouth daily.     ? donepezil (ARICEPT) 5 MG tablet Take 5 mg by mouth in the morning.    ? finasteride (PROSCAR) 5 MG tablet TAKE 1 TABLET BY MOUTH DAILY 90 tablet 3  ? furosemide (LASIX) 40 MG tablet Take 1 tablet (40 mg total) by mouth daily. 90 tablet 3  ? melatonin 5 MG TABS Take 5 mg by mouth at bedtime.    ? polyethylene glycol (MIRALAX / GLYCOLAX) 17 g packet Take 17 g by mouth in the morning.    ? tamsulosin (FLOMAX) 0.4 MG CAPS capsule Take 1 capsule (0.4 mg total) by mouth daily. 90 capsule 3  ? VITAMIN D3 SUPER STRENGTH 50 MCG (2000 UT) CAPS Take 2,000 Units by mouth in  the morning.    ? albuterol (VENTOLIN HFA) 108 (90 Base) MCG/ACT inhaler Inhale 2 puffs into the lungs every 4 (four) hours as needed for wheezing or shortness of breath. (Patient not taking: Reported on 02/01/2022) 8 g 0  ? ?No current facility-administered medications on file prior to visit.  ? ? ?Allergies  ?Allergen Reactions  ? Tape Other (See Comments)  ?  THE PATIENT'S SKIN IS VERY DELICATE- WILL TEAR!!  ? ? ?Past Medical History:  ?Diagnosis Date  ? Advance directive discussed with patient 07/15/2015  ? Atrial fibrillation ([place]) 01/23/2011  ? Benign prostatic hyperplasia (BPH) with urinary urgency 09/11/2007  ? Qualifier: Diagnosis of  By: Silvio Pate MD, [facility]   ? Dementia ([place])   ? Gout   ? Hyperlipidemia   ? Left inguinal hernia 03/29/2020  ? Mobitz type II atrioventricular block 01/25/2014  ? Pacemaker 04/27/2014  ? Pedal edema 09/10/2016  ? Routine general medical examination at a health care facility 07/01/2013  ? Scrotal right inuinal hernia 08/13/2019  ? Symptomatic bradycardia   ? s/p dual chamber PPM implantation April 2015 ([place])  ? ? ?Past Surgical History:  ?Procedure Laterality Date  ? CATARACT EXTRACTION    ?  PACEMAKER INSERTION  01-25-2014  ? STJ Assurity dual chamber pacemaker implanted by Dr Lovena Le for symptomatic bradycardia  ? PERMANENT PACEMAKER INSERTION N/A 01/25/2014  ? Procedure: PERMANENT PACEMAKER INSERTION;  Surgeon: Evans Lance, MD;  Location: [facility] CATH LAB;  Service: Cardiovascular;  Laterality: N/A;  ? TONSILLECTOMY    ? ? ?Family History  ?Problem Relation Age of Onset  ? Cancer Mother   ? Throat cancer Father   ? ? ?Social History  ? ?Socioeconomic History  ? Marital status: Widowed  ?  Spouse name: Kahari Mould  ? Number of children: 4  ? Years of education: Not on file  ? Highest education level: Not on file  ?Occupational History  ? Occupation: retired  ?  Comment: [employer]  ?Tobacco Use  ? Smoking status: Never  ? Smokeless tobacco: Never  ?Vaping Use  ?  Vaping Use: Never used  ?Substance and Sexual Activity  ? Alcohol use: Yes  ?  Comment: rare etoh use  ? Drug use: No  ? Sexual activity: Not on file  ?Other Topics Concern  ? Not on file  ?Social History Narrative  ? Widowed 4/15  ? No living will  ? Health care power of attorney is son Rush Landmark. Daughter Shirlean Mylar is financial POA   ? He feels he is ready for DNR---form done 08/13/19 (but he isn't sure he will put it up)  ? No tube feeds if cognitively unaware  ? ?Social Determinants of Health  ? ?Financial Resource Strain: Not on file  ?Food Insecurity: Not on file  ?Transportation Needs: Not on file  ?Physical Activity: Not on file  ?Stress: Not on file  ?Social Connections: Not on file  ?Intimate Partner Violence: Not on file  ? ?Review of Systems ?Sleeps in bed but with 4 pillows---no PND ?Does pretty well with melatonin ?Eating well ?Urine can be dark at times---no dysuria ?   ?Objective:  ? Physical Exam ?Constitutional:   ?   Appearance: He is well-developed.  ?   Interventions: He is not intubated. ?Cardiovascular:  ?   Rate and Rhythm: Normal rate and regular rhythm.  ?   Heart sounds: No murmur heard. ?  No gallop.  ?Pulmonary:  ?   Effort: Pulmonary effort is normal. He is not intubated.  ?   Breath sounds: No wheezing or rales.  ?   Comments: Slightly decreased breath sounds but clear ?Musculoskeletal:  ?   Cervical back: Neck supple.  ?   Right lower leg: No edema.  ?   Left lower leg: No edema.  ?Lymphadenopathy:  ?   Cervical: No cervical adenopathy.  ?Neurological:  ?   Mental Status: He is alert.  ?Psychiatric:     ?   Mood and Affect: Mood normal.     ?   Behavior: Behavior normal.  ?  ? ? ? ? ?   ?Assessment & Plan:  ? ?

## 2022-02-01 NOTE — Assessment & Plan Note (Signed)
Stable cognition and function ?Donepezil 5mg  apparently really helped him ?

## 2022-02-01 NOTE — Assessment & Plan Note (Signed)
Compensated on furosemide 80mg   ?He does get an extra 40 if his weight is up ? ?

## 2022-02-01 NOTE — Assessment & Plan Note (Signed)
Regular now ?On ASA only due to falls ?

## 2022-02-07 ENCOUNTER — Telehealth: Payer: Self-pay | Admitting: Internal Medicine

## 2022-02-07 ENCOUNTER — Emergency Department (HOSPITAL_COMMUNITY)
Admission: EM | Admit: 2022-02-07 | Discharge: 2022-02-07 | Disposition: A | Discharge status: Home / Self Care | Payer: Medicare Other | Attending: Emergency Medicine | Admitting: Emergency Medicine

## 2022-02-07 ENCOUNTER — Encounter (HOSPITAL_COMMUNITY): Payer: Self-pay

## 2022-02-07 DIAGNOSIS — Z7982 Long term (current) use of aspirin: Secondary | ICD-10-CM | POA: Diagnosis not present

## 2022-02-07 DIAGNOSIS — R55 Syncope and collapse: Secondary | ICD-10-CM | POA: Insufficient documentation

## 2022-02-07 DIAGNOSIS — F0390 Unspecified dementia without behavioral disturbance: Secondary | ICD-10-CM | POA: Diagnosis not present

## 2022-02-07 LAB — COMPREHENSIVE METABOLIC PANEL
ALT: 12 U/L (ref 0–44)
AST: 24 U/L (ref 15–41)
Albumin: 3.3 g/dL — ABNORMAL LOW (ref 3.5–5.0)
Alkaline Phosphatase: 64 U/L (ref 38–126)
Anion gap: 11 (ref 5–15)
BUN: 26 mg/dL — ABNORMAL HIGH (ref 8–23)
CO2: 29 mmol/L (ref 22–32)
Calcium: 8.7 mg/dL — ABNORMAL LOW (ref 8.9–10.3)
Chloride: 98 mmol/L (ref 98–111)
Creatinine, Ser: 1.23 mg/dL (ref 0.61–1.24)
GFR, Estimated: 52 mL/min — ABNORMAL LOW (ref >60)
Glucose, Bld: 177 mg/dL — ABNORMAL HIGH (ref 70–99)
Potassium: 3.8 mmol/L (ref 3.5–5.1)
Sodium: 138 mmol/L (ref 135–145)
Total Bilirubin: 0.6 mg/dL (ref 0.3–1.2)
Total Protein: 6.8 g/dL (ref 6.5–8.1)

## 2022-02-07 LAB — CBC WITH DIFFERENTIAL/PLATELET
Abs Immature Granulocytes: 0.04 10*3/uL (ref 0.00–0.07)
Basophils Absolute: 0 10*3/uL (ref 0.0–0.1)
Basophils Relative: 0 %
Eosinophils Absolute: 0 10*3/uL (ref 0.0–0.5)
Eosinophils Relative: 0 %
HCT: 35.1 % — ABNORMAL LOW (ref 39.0–52.0)
Hemoglobin: 10.9 g/dL — ABNORMAL LOW (ref 13.0–17.0)
Immature Granulocytes: 0 %
Lymphocytes Relative: 17 %
Lymphs Abs: 1.5 10*3/uL (ref 0.7–4.0)
MCH: 33.4 pg (ref 26.0–34.0)
MCHC: 31.1 g/dL (ref 30.0–36.0)
MCV: 107.7 fL — ABNORMAL HIGH (ref 80.0–100.0)
Monocytes Absolute: 0.8 10*3/uL (ref 0.1–1.0)
Monocytes Relative: 9 %
Neutro Abs: 6.7 10*3/uL (ref 1.7–7.7)
Neutrophils Relative %: 74 %
Platelets: 167 10*3/uL (ref 150–400)
RBC: 3.26 MIL/uL — ABNORMAL LOW (ref 4.22–5.81)
RDW: 16.1 % — ABNORMAL HIGH (ref 11.5–15.5)
WBC: 9.1 10*3/uL (ref 4.0–10.5)
nRBC: 0 % (ref 0.0–0.2)

## 2022-02-07 LAB — URINALYSIS, ROUTINE W REFLEX MICROSCOPIC
Bilirubin Urine: NEGATIVE
Glucose, UA: NEGATIVE mg/dL
Hgb urine dipstick: NEGATIVE
Ketones, ur: NEGATIVE mg/dL
Leukocytes,Ua: NEGATIVE
Nitrite: NEGATIVE
Protein, ur: NEGATIVE mg/dL
Specific Gravity, Urine: 1.01 (ref 1.005–1.030)
pH: 5 (ref 5.0–8.0)

## 2022-02-07 LAB — AMMONIA: Ammonia: 19 umol/L (ref 9–35)

## 2022-02-07 LAB — URIC ACID: Uric Acid, Serum: 5.3 mg/dL (ref 3.7–8.6)

## 2022-02-07 NOTE — Telephone Encounter (Signed)
?  1. Has your device fired? no ? ?2. Is you device beeping? no ? ?3. Are you experiencing draining or swelling at device site? no ? ?4. Are you calling to see if we received your device transmission?  ? ?5. Have you passed out? He passed out 2 times this morning, he is in the ER at this time- The daughter said they need you to look at monitor and see what happen this morning about  7:15 or 7:20 and again 9:00- She said he was unconscious also,urinated and  had a bowel movement on himself. She said it happen again  about 2 weeks ago  ? ? ? ?Please route to Device Clinic Pool ? ?

## 2022-02-07 NOTE — ED Provider Notes (Signed)
?[facility] EMERGENCY DEPARTMENT ?Provider Note ? ? ?CSN: [id number] ?Arrival date & time: 02/07/22  1330 ? ?  ? ?History ? ?Chief Complaint  ?Patient presents with  ? Near Syncope  ? ? ?Matthew Howard is a 88 y.o. male. ? ? ?Near Syncope ?Patient with near syncopal episode.  Happened today.  Happened while in the shower.  Has had previous episodes of similar symptoms.  Reportedly is happened about 10 times in the last year.  States this is the first time however he had bowel incontinence with it.  He was taking a shower and began to look pale.  Passed out.  Did urinate initially and then later had more fecal incontinence.  Was confused but later came back to normal.  No injury from the fall.  Was caught by his son.  Has had more episodes like this.  Diastolic blood pressure reported to run low.  Does have a history of dementia. ? ?  ? ?Home Medications ?Prior to Admission medications   ?Medication Sig Start Date End Date Taking? Authorizing Provider  ?albuterol (VENTOLIN HFA) 108 (90 Base) MCG/ACT inhaler Inhale 2 puffs into the lungs every 4 (four) hours as needed for wheezing or shortness of breath. ?Patient not taking: Reported on 02/01/2022 05/06/21   Glade Lloyd, MD  ?allopurinol (ZYLOPRIM) 300 MG tablet Take 1 tablet (300 mg total) by mouth daily. 02/01/22   [name], MD  ?aspirin 81 MG EC tablet Take 1 tablet (81 mg total) by mouth in the morning. 07/13/21   Leroy Sea, MD  ?cetirizine (ZYRTEC) 10 MG tablet Take 10 mg by mouth daily.    [provider]  ?colchicine 0.6 MG tablet TAKE 1 TABLET (0.6 MG TOTAL) BY MOUTH 2 (TWO) TIMES DAILY AS NEEDED. 10/29/20   [name], MD  ?Cyanocobalamin 2500 MCG TABS Take 2,500 mcg by mouth daily.     [provider]  ?donepezil (ARICEPT) 5 MG tablet Take 1 tablet (5 mg total) by mouth in the morning. 02/01/22   [name], MD  ?finasteride (PROSCAR) 5 MG tablet TAKE 1 TABLET BY MOUTH DAILY 09/12/16   [name], MD  ?furosemide (LASIX) 40 MG tablet Take 1 tablet (40 mg total) by mouth daily. 07/27/21   Jake Bathe, MD  ?melatonin 5 MG TABS Take 5 mg by mouth at bedtime.    [provider]  ?polyethylene glycol (MIRALAX / GLYCOLAX) 17 g packet Take 17 g by mouth in the morning.    [provider]  ?tamsulosin (FLOMAX) 0.4 MG CAPS capsule Take 1 capsule (0.4 mg total) by mouth daily. 07/12/21   [name], MD  ?VITAMIN D3 SUPER STRENGTH 50 MCG (2000 UT) CAPS Take 2,000 Units by mouth in the morning.    [provider]  ?   ? ?Allergies    ?Tape   ? ?Review of Systems   ?Review of Systems  ?Cardiovascular:  Positive for near-syncope.  ?Neurological:  Positive for syncope.  ? ?Physical Exam ?Updated Vital Signs ?BP (!) 134/58   Pulse 69   Temp 97.9 ?F (36.6 ?C) (Oral)   Resp 19   Ht 5\' 11"  (1.803 m)   Wt 77.1 kg   SpO2 98%   BMI 23.71 kg/m?  ?Physical Exam ?Vitals and nursing note reviewed.  ?Constitutional:   ?   Appearance: Normal appearance.  ?HENT:  ?   Head: Atraumatic.  ?Cardiovascular:  ?   Rate  and Rhythm: Regular rhythm.  ?Pulmonary:  ?   Effort: Pulmonary effort is normal.  ?Musculoskeletal:     ?   General: No tenderness.  ?Skin: ?   General: Skin is warm.  ?Neurological:  ?   Mental Status: Mental status is at baseline.  ? ? ?ED Results / Procedures / Treatments   ?Labs ?(all labs ordered are listed, but only abnormal results are displayed) ?Labs Reviewed  ?CBC WITH DIFFERENTIAL/PLATELET - Abnormal; Notable for the following components:  ?    Result Value  ? RBC 3.26 (*)   ? Hemoglobin 10.9 (*)   ? HCT 35.1 (*)   ? MCV 107.7 (*)   ? RDW 16.1 (*)   ? All other components within normal limits  ?COMPREHENSIVE METABOLIC PANEL - Abnormal; Notable for the following components:  ? Glucose, Bld 177 (*)   ? BUN 26 (*)   ? Calcium 8.7 (*)   ? Albumin 3.3 (*)   ? GFR, Estimated 52 (*)   ? All other components within normal limits  ?URINALYSIS, ROUTINE W REFLEX MICROSCOPIC   ?AMMONIA  ?URIC ACID  ?CBG MONITORING, ED  ? ? ?EKG ?EKG Interpretation ? ?Date/Time:  Wednesday Feb 07 2022 16:22:09 EDT ?Ventricular Rate:  70 ?PR Interval:  315 ?QRS Duration: 164 ?QT Interval:  476 ?QTC Calculation: 514 ?R Axis:   -76 ?Text Interpretation: Electronic ventricular pacemaker Nonspecific IVCD with LAD LVH with secondary repolarization abnormality Inferior infarct, old Anterior infarct, old Confirmed by Benjiman Core [phone]) on 02/07/2022 5:07:34 PM ? ?Radiology ?No results found. ? ?Procedures ?Procedures  ? ? ?Medications Ordered in ED ?Medications - No data to display ? ?ED Course/ Medical Decision Making/ A&P ?  ?                        ?Medical Decision Making ? ?Patient with syncopal episode.  Happened in the bathroom.  Fell.  Has a urine and fecal incontinence which is somewhat unusual for him.  Interrogated pacemaker and did find that he had a atrial flutter/fib has been somewhat chronic although it has had some episodes of the rate going fast.  Lab work reassuring.  Lab work near baseline including mildly elevated creatinine.  Feels better.  No dizziness with standing.  Discussed with patient and his daughter-in-law.  Not on blood thinners but does have the A-fib.  Has refused blood thinners in the past.  Potentially could be a little symptomatic with the A-fib and can follow-up with his electrophysiologist as cardiologist.  Reportedly has low blood pressures at times and patient's daughter-in-law asked about maybe starting on midodrine.  Also can discuss with cardiologist about this.  Will discharge home. ? ? ? ? ? ? ? ?Final Clinical Impression(s) / ED Diagnoses ?Final diagnoses:  ?Near syncope  ? ? ?Rx / DC Orders ?ED Discharge Orders   ? ? None  ? ?  ? ? ?  ?Benjiman Core, MD ?02/08/22 0005 ? ?

## 2022-02-07 NOTE — Progress Notes (Signed)
Remote pacemaker transmission.   

## 2022-02-07 NOTE — ED Notes (Signed)
ED Provider at bedside. 

## 2022-02-07 NOTE — Telephone Encounter (Signed)
Attempted to call daughter, Caton Popowski (on [place]) back. Per DPR may leave detailed message on given number. Informed Ms. Madaline Guthrie that a transmission has not been received since 01/22/22 and if she is concerned that it may be arhythmia related she could discuss with the ED provider for possible device interrogation while in the ED. Left device clinic contact for additional questions or concerns.  ?

## 2022-02-07 NOTE — Discharge Instructions (Signed)
Follow-up with cardiology to see if he would need adjustment of his medication or further evaluation of the atrial fibrillation ?

## 2022-02-07 NOTE — ED Provider Triage Note (Signed)
Emergency Medicine Provider Triage Evaluation Note ? ?[name] , a 88 y.o. male  was evaluated in triage.  Pt complains of syncopal episode.  His daughter-in-law states that the patient was in the shower and suffered a syncopal episode, with the patient's son catching the patient before he fell to the ground.  Patient was reportedly unconscious for approximately 3 minutes.  The patient was incontinent during the episode with urinary incontinence.  Shortly thereafter patient was incontinent fecally.  Patient has equal grip strength, is conscious alert and oriented to person place and event, no immediate sign of stroke.  Patient does have history of previous syncopal episodes.  Patient has pacemaker.  Patient denies any symptoms at this time.  Patient is here at the urging of his daughter-in-law ? ?Review of Systems  ?Positive: Syncopal Episode, incontinence ?Negative: Pain ? ?Physical Exam  ?BP (!) 139/59 (BP Location: Right Arm)   Pulse 75   Temp 97.9 ?F (36.6 ?C) (Oral)   Resp 18   Ht 5\' 11"  (1.803 m)   Wt 77.1 kg   SpO2 97%   BMI 23.71 kg/m?  ?Gen:   Awake, no distress   ?Resp:  Normal effort  ?MSK:   Moves extremities without difficulty  ?Other:   ? ?Medical Decision Making  ?Medically screening exam initiated at 2:01 PM.  Appropriate orders placed.  [name] was informed that the remainder of the evaluation will be completed by another provider, this initial triage assessment does not replace that evaluation, and the importance of remaining in the ED until their evaluation is complete. ? ? ?  ?Dorothyann Peng, PA-C ?02/07/22 1403 ? ?

## 2022-02-07 NOTE — ED Notes (Signed)
RN interrogated pts pacemaker, [employer].  ?

## 2022-02-07 NOTE — ED Triage Notes (Signed)
Triage assisted by pt's daughter in law due to pt's hx of dementia. Family states that pt was getting out of the shower and he had near syncope episode where he was assisted to the ground. No head injury/LOC. Pt then became incontinent of bowel and bladder. Pt has poor recollection of events.  ?

## 2022-02-14 ENCOUNTER — Telehealth: Payer: Self-pay

## 2022-02-14 NOTE — Telephone Encounter (Signed)
Spoke to pt's son. 

## 2022-02-14 NOTE — Telephone Encounter (Signed)
Pt's daughter had some questions regarding Pt's pacemaker after recent hospitalization. ? ?Discussed questions. ? ?Per daughter, Pt keeps his remote monitor in the kitchen.  Advised that Pt needs to keep that next to his bed because his device is checked nightly.  Daughter states she will move monitor. ? ?Per daughter Pt is having presyncopal episodes that seem to relate to when he is showering.  Advised to send a remote transmission after these episodes and to call device clinic directly.  Direct # given. ? ?Daughter indicates understanding of education provided.  She will call device clinic with any further questions. ?

## 2022-02-14 NOTE — Telephone Encounter (Signed)
Spoke to pt's dil. She said his pacemaker was set at 100 bpm. They adjusted to 70 bpm. She was asking if he should go see cardiology. Thinks the ER adjusted his pacemaker without consulting cardiology, first. York Spaniel he is in Afib all the time and is not on anything for it. She said she has tried to call Dr Anne Fu office but says no one answers.  ?

## 2022-02-20 NOTE — Telephone Encounter (Signed)
Per device team (EP) this has been addressed.   See telephone note by Melina Copa 5/15.   ?

## 2022-03-16 ENCOUNTER — Ambulatory Visit: Payer: Medicare Other | Admitting: Nurse Practitioner

## 2022-03-27 ENCOUNTER — Telehealth: Payer: Self-pay | Admitting: Internal Medicine

## 2022-03-27 NOTE — Telephone Encounter (Signed)
Caller Name: Gavin Pound Reppond(Daughter in law)  Pts daughter in law called for a refill for finasteride (PROSCAR) 5 MG tablet, I let her know that it shows it hasnt been filled by [place] since 2017, she said maybe it was filled by his urologist and said she would call them because he still has been taking it

## 2022-04-23 ENCOUNTER — Ambulatory Visit (INDEPENDENT_AMBULATORY_CARE_PROVIDER_SITE_OTHER): Payer: Medicare Other

## 2022-04-23 DIAGNOSIS — I441 Atrioventricular block, second degree: Secondary | ICD-10-CM | POA: Diagnosis not present

## 2022-04-24 LAB — CUP PACEART REMOTE DEVICE CHECK
Battery Remaining Longevity: 13 mo
Battery Remaining Percentage: 12 %
Battery Voltage: 2.86 V
Brady Statistic AP VP Percent: 0 %
Brady Statistic AP VS Percent: 0 %
Brady Statistic AS VP Percent: 0 %
Brady Statistic AS VS Percent: 0 %
Brady Statistic RA Percent Paced: 0 %
Brady Statistic RV Percent Paced: 97 %
Date Time Interrogation Session: 20230717111741
Implantable Lead Implant Date: 20150420
Implantable Lead Implant Date: 20150420
Implantable Lead Location: 753859
Implantable Lead Location: 753860
Implantable Pulse Generator Implant Date: 20150420
Lead Channel Impedance Value: 440 Ohm
Lead Channel Impedance Value: 480 Ohm
Lead Channel Pacing Threshold Amplitude: 0.5 V
Lead Channel Pacing Threshold Amplitude: 1 V
Lead Channel Pacing Threshold Pulse Width: 0.4 ms
Lead Channel Pacing Threshold Pulse Width: 0.4 ms
Lead Channel Sensing Intrinsic Amplitude: 2.3 mV
Lead Channel Sensing Intrinsic Amplitude: 9.6 mV
Lead Channel Setting Pacing Amplitude: 2 V
Lead Channel Setting Pacing Amplitude: 2.5 V
Lead Channel Setting Pacing Pulse Width: 0.4 ms
Lead Channel Setting Sensing Sensitivity: 2 mV
Pulse Gen Model: 2240
Pulse Gen Serial Number: 3013717

## 2022-05-22 ENCOUNTER — Encounter: Payer: Self-pay | Admitting: Internal Medicine

## 2022-05-23 NOTE — Telephone Encounter (Signed)
He is already scheduled for an appointment here on 05-25-22. This is a Friday. The problem with that would be having it read Monday morning do later than the time it was placed. We can discuss it at the visit that day.

## 2022-05-24 NOTE — Progress Notes (Signed)
Remote pacemaker transmission.   

## 2022-05-25 ENCOUNTER — Encounter: Payer: Self-pay | Admitting: Internal Medicine

## 2022-05-25 ENCOUNTER — Ambulatory Visit: Payer: Medicare Other | Admitting: Internal Medicine

## 2022-05-25 VITALS — BP 124/68 | HR 70 | Temp 97.7°F | Ht 71.0 in | Wt 168.0 lb

## 2022-05-25 DIAGNOSIS — R3915 Urgency of urination: Secondary | ICD-10-CM

## 2022-05-25 DIAGNOSIS — I5032 Chronic diastolic (congestive) heart failure: Secondary | ICD-10-CM | POA: Diagnosis not present

## 2022-05-25 DIAGNOSIS — I480 Paroxysmal atrial fibrillation: Secondary | ICD-10-CM | POA: Diagnosis not present

## 2022-05-25 DIAGNOSIS — Z111 Encounter for screening for respiratory tuberculosis: Secondary | ICD-10-CM | POA: Diagnosis not present

## 2022-05-25 DIAGNOSIS — F0150 Vascular dementia without behavioral disturbance: Secondary | ICD-10-CM

## 2022-05-25 DIAGNOSIS — N401 Enlarged prostate with lower urinary tract symptoms: Secondary | ICD-10-CM

## 2022-05-25 NOTE — Assessment & Plan Note (Signed)
Now with increased incontinence now Finasteride 5/tamsulosin 0.4mg  daily

## 2022-05-25 NOTE — Assessment & Plan Note (Signed)
Regular now Is on ASA only 81mg 

## 2022-05-25 NOTE — Progress Notes (Signed)
Subjective:    Patient ID: Matthew Howard, male    DOB: 1919-02-13, 88 y.o.   MRN: [id number]  HPI Here with daughter in law for FL-2 and to review several concerns  Moving to [facility] in [place] This is memory care  DIL notes increasing urinary incontinence---wetting 3 pull-ups a day Occasional fecal incontinence No dysuria or hematuria Discussed that this is fairly typical with dementia  Chronic DOE --may be worse this morning (she was rushing him) Weight up slightly---does get the furosemide 80 daily He does use salt No chest pain DIL giving him water before a shower--helps prevent syncope in shower  Help with ADLs still Some trouble with stress/change---discussed upcoming move  Current Outpatient Medications on File Prior to Visit  Medication Sig Dispense Refill   acetaminophen (TYLENOL) 500 MG tablet Take 750 mg by mouth 2 (two) times daily as needed.     albuterol (VENTOLIN HFA) 108 (90 Base) MCG/ACT inhaler Inhale 2 puffs into the lungs every 4 (four) hours as needed for wheezing or shortness of breath. 8 g 0   allopurinol (ZYLOPRIM) 300 MG tablet Take 1 tablet (300 mg total) by mouth daily. 90 tablet 3   aspirin 81 MG EC tablet Take 1 tablet (81 mg total) by mouth in the morning. 30 tablet 12   cetirizine (ZYRTEC) 10 MG tablet Take 10 mg by mouth daily.     colchicine 0.6 MG tablet TAKE 1 TABLET (0.6 MG TOTAL) BY MOUTH 2 (TWO) TIMES DAILY AS NEEDED. (Patient taking differently: Take 0.6 mg by mouth 2 (two) times daily as needed (For Gout Flare-up. See orders).) 60 tablet 0   Cyanocobalamin 2500 MCG TABS Take 2,500 mcg by mouth daily.      donepezil (ARICEPT) 5 MG tablet Take 1 tablet (5 mg total) by mouth in the morning. 90 tablet 3   finasteride (PROSCAR) 5 MG tablet TAKE 1 TABLET BY MOUTH DAILY 90 tablet 3   furosemide (LASIX) 40 MG tablet Take 1 tablet (40 mg total) by mouth daily. (Patient taking differently: Take 40 mg by mouth daily. As needed if weight is  over 170 or 3lb weight gain in 1-2 days) 90 tablet 3   melatonin 5 MG TABS Take 5 mg by mouth at bedtime.     polyethylene glycol (MIRALAX / GLYCOLAX) 17 g packet Take 17 g by mouth in the morning.     tamsulosin (FLOMAX) 0.4 MG CAPS capsule Take 1 capsule (0.4 mg total) by mouth daily. 90 capsule 3   VITAMIN D3 SUPER STRENGTH 50 MCG (2000 UT) CAPS Take 2,000 Units by mouth in the morning.     No current facility-administered medications on file prior to visit.    Allergies  Allergen Reactions   Tape Other (See Comments)    THE PATIENT'S SKIN IS VERY DELICATE- WILL TEAR!!    Past Medical History:  Diagnosis Date   Advance directive discussed with patient 07/15/2015   Atrial fibrillation ([facility]) 01/23/2011   Benign prostatic hyperplasia (BPH) with urinary urgency 09/11/2007   Qualifier: Diagnosis of  By: Alphonsus Sias MD, Ronnette Hila    Dementia [facility])    Gout    Hyperlipidemia    Left inguinal hernia 03/29/2020   Mobitz type II atrioventricular block 01/25/2014   Pacemaker 04/27/2014   Pedal edema 09/10/2016   Routine general medical examination at a health care facility 07/01/2013   Scrotal right inuinal hernia 08/13/2019   Symptomatic bradycardia    s/p dual chamber PPM  implantation April 2015 [facility] [employer])    Past Surgical History:  Procedure Laterality Date   CATARACT EXTRACTION     PACEMAKER INSERTION  01-25-2014   STJ Assurity dual chamber pacemaker implanted by Dr [name] for symptomatic bradycardia   PERMANENT PACEMAKER INSERTION N/A 01/25/2014   Procedure: PERMANENT PACEMAKER INSERTION;  Surgeon: Marinus Maw, MD;  Location: [facility] CATH LAB;  Service: Cardiovascular;  Laterality: N/A;   TONSILLECTOMY      Family History  Problem Relation Age of Onset   Cancer Mother    Throat cancer Father     Social History   Socioeconomic History   Marital status: Widowed    Spouse name: Jairo Bellew   Number of children: 4   Years of education: Not on file   Highest education  level: Not on file  Occupational History   Occupation: retired    Comment: [employer]  Tobacco Use   Smoking status: Never   Smokeless tobacco: Never  Vaping Use   Vaping Use: Never used  Substance and Sexual Activity   Alcohol use: Yes    Comment: rare etoh use   Drug use: No   Sexual activity: Not on file  Other Topics Concern   Not on file  Social History Narrative   Widowed 4/15   No living will   Health care power of attorney is son [name]. Daughter [name] is financial POA    He feels he is ready for DNR---form done 08/13/19 (but he isn't sure he will put it up)   No tube feeds if cognitively unaware   Social Determinants of Health   Financial Resource Strain: Not on file  Food Insecurity: Not on file  Transportation Needs: Not on file  Physical Activity: Not on file  Stress: Not on file  Social Connections: Not on file  Intimate Partner Violence: Not on file   Review of Systems Appetite is good Sleeps okay--in bed. Sleeps on 4 pillows. No PND    Objective:   Physical Exam Constitutional:      Appearance: He is well-developed.  Cardiovascular:     Rate and Rhythm: Normal rate and regular rhythm.     Heart sounds: No murmur heard.    No gallop.  Pulmonary:     Breath sounds: Normal breath sounds. No wheezing or rales.  Musculoskeletal:     Cervical back: Neck supple.     Right lower leg: No edema.     Left lower leg: No edema.  Lymphadenopathy:     Cervical: No cervical adenopathy.  Neurological:     Mental Status: He is alert.  Psychiatric:        Mood and Affect: Mood normal.        Behavior: Behavior normal.            Assessment & Plan:

## 2022-05-25 NOTE — Addendum Note (Signed)
Addended by: Eual Fines on: 05/25/2022 12:00 PM   Modules accepted: Orders

## 2022-05-25 NOTE — Assessment & Plan Note (Addendum)
Appropriate to move into memory care On donepezil 5mg  daily FL-2 done

## 2022-05-25 NOTE — Assessment & Plan Note (Signed)
Compensated with furosemide 80mg  daily Can add second dose (40 or 80) at lunch if weight is up

## 2022-05-28 LAB — TB SKIN TEST: TB Skin Test: NEGATIVE

## 2022-06-04 ENCOUNTER — Emergency Department (HOSPITAL_COMMUNITY)
Admission: EM | Admit: 2022-06-04 | Discharge: 2022-06-04 | Disposition: A | Discharge status: Home / Self Care | Payer: Medicare Other | Attending: Emergency Medicine | Admitting: Emergency Medicine

## 2022-06-04 ENCOUNTER — Emergency Department (HOSPITAL_COMMUNITY): Payer: Medicare Other

## 2022-06-04 ENCOUNTER — Other Ambulatory Visit: Payer: Self-pay

## 2022-06-04 ENCOUNTER — Encounter (HOSPITAL_COMMUNITY): Payer: Self-pay

## 2022-06-04 DIAGNOSIS — R059 Cough, unspecified: Secondary | ICD-10-CM | POA: Insufficient documentation

## 2022-06-04 DIAGNOSIS — F0390 Unspecified dementia without behavioral disturbance: Secondary | ICD-10-CM | POA: Insufficient documentation

## 2022-06-04 DIAGNOSIS — I4891 Unspecified atrial fibrillation: Secondary | ICD-10-CM | POA: Diagnosis not present

## 2022-06-04 DIAGNOSIS — Z7982 Long term (current) use of aspirin: Secondary | ICD-10-CM | POA: Diagnosis not present

## 2022-06-04 DIAGNOSIS — R531 Weakness: Secondary | ICD-10-CM | POA: Insufficient documentation

## 2022-06-04 LAB — COMPREHENSIVE METABOLIC PANEL
ALT: 12 U/L (ref 0–44)
AST: 20 U/L (ref 15–41)
Albumin: 2.6 g/dL — ABNORMAL LOW (ref 3.5–5.0)
Alkaline Phosphatase: 58 U/L (ref 38–126)
Anion gap: 6 (ref 5–15)
BUN: 19 mg/dL (ref 8–23)
CO2: 30 mmol/L (ref 22–32)
Calcium: 8 mg/dL — ABNORMAL LOW (ref 8.9–10.3)
Chloride: 105 mmol/L (ref 98–111)
Creatinine, Ser: 1.03 mg/dL (ref 0.61–1.24)
GFR, Estimated: >60 mL/min (ref >60)
Glucose, Bld: 90 mg/dL (ref 70–99)
Potassium: 3.8 mmol/L (ref 3.5–5.1)
Sodium: 141 mmol/L (ref 135–145)
Total Bilirubin: 0.5 mg/dL (ref 0.3–1.2)
Total Protein: 6 g/dL — ABNORMAL LOW (ref 6.5–8.1)

## 2022-06-04 LAB — CBC WITH DIFFERENTIAL/PLATELET
Abs Immature Granulocytes: 0.04 10*3/uL (ref 0.00–0.07)
Basophils Absolute: 0 10*3/uL (ref 0.0–0.1)
Basophils Relative: 0 %
Eosinophils Absolute: 0.2 10*3/uL (ref 0.0–0.5)
Eosinophils Relative: 3 %
HCT: 33.4 % — ABNORMAL LOW (ref 39.0–52.0)
Hemoglobin: 10 g/dL — ABNORMAL LOW (ref 13.0–17.0)
Immature Granulocytes: 1 %
Lymphocytes Relative: 14 %
Lymphs Abs: 1.1 10*3/uL (ref 0.7–4.0)
MCH: 31.4 pg (ref 26.0–34.0)
MCHC: 29.9 g/dL — ABNORMAL LOW (ref 30.0–36.0)
MCV: 105 fL — ABNORMAL HIGH (ref 80.0–100.0)
Monocytes Absolute: 1.2 10*3/uL — ABNORMAL HIGH (ref 0.1–1.0)
Monocytes Relative: 15 %
Neutro Abs: 5.5 10*3/uL (ref 1.7–7.7)
Neutrophils Relative %: 67 %
Platelets: 211 10*3/uL (ref 150–400)
RBC: 3.18 MIL/uL — ABNORMAL LOW (ref 4.22–5.81)
RDW: 17.4 % — ABNORMAL HIGH (ref 11.5–15.5)
WBC: 8 10*3/uL (ref 4.0–10.5)
nRBC: 0 % (ref 0.0–0.2)

## 2022-06-04 LAB — LACTIC ACID, PLASMA: Lactic Acid, Venous: 1.6 mmol/L (ref 0.5–1.9)

## 2022-06-04 LAB — PROTIME-INR
INR: 1.1 (ref 0.8–1.2)
Prothrombin Time: 14.2 seconds (ref 11.4–15.2)

## 2022-06-04 MED ORDER — SODIUM CHLORIDE 0.9 % IV BOLUS
500.0000 mL | Freq: Once | INTRAVENOUS | Status: AC
  Administered 2022-06-04: 500 mL via INTRAVENOUS

## 2022-06-04 NOTE — ED Triage Notes (Signed)
Pt arrived to ED via EMS from home w/ several complaints. Pt has a home health provider who comes to see him. Home health called EMS d/t bright red blood found in his depend. Per report pt hasn't voided in 3 days and has a retention issue. Pt unable to verbalizes when he last urinated. Pt noted to be A&Ox3 (disoriented to time). Home health provider at bedside and reports he voided this morning. Home health provider also reports pt was sitting in a chair and noted that pt had more labored breathing. EMS reports fine crackles in the lower lobes. EMS placed pt on 2L O2 via [place] d/t work of breathing which pt denies feeling shob. Home health provider reports pt is normally A&Ox4. VSS w/ EMS

## 2022-06-04 NOTE — ED Provider Notes (Signed)
[facility] EMERGENCY DEPARTMENT Provider Note   CSN: [id number] Arrival date & time: 06/04/22  3016     History  Chief Complaint  Patient presents with   Multiple Complaints    Matthew Howard is a 88 y.o. male.  HPI Patient with dementia.  Brought in by caregiver.  Reportedly had decreased urination.  Blood in his diaper.  May have been more confused.  Patient has some dementia.  Without complaints at this time. Patient's daughter later arrives.  States he is at his baseline and they probably would not have brought him back in.  Has had occasional cough.   Past Medical History:  Diagnosis Date   Advance directive discussed with patient 07/15/2015   Atrial fibrillation (HCC) 01/23/2011   Benign prostatic hyperplasia (BPH) with urinary urgency 09/11/2007   Qualifier: Diagnosis of  By: Alphonsus Sias MD, Ronnette Hila    Dementia [facility])    Gout    Hyperlipidemia    Left inguinal hernia 03/29/2020   Mobitz type II atrioventricular block 01/25/2014   Pacemaker 04/27/2014   Pedal edema 09/10/2016   Routine general medical examination at a health care facility 07/01/2013   Scrotal right inuinal hernia 08/13/2019   Symptomatic bradycardia    s/p dual chamber PPM implantation April 2015 [facility])    Home Medications Prior to Admission medications   Medication Sig Start Date End Date Taking? Authorizing Provider  acetaminophen (TYLENOL) 500 MG tablet Take 1,000 mg by mouth daily as needed (gout pain).   Yes [provider]  allopurinol (ZYLOPRIM) 300 MG tablet Take 1 tablet (300 mg total) by mouth daily. 02/01/22  Yes [name], MD  aspirin 81 MG EC tablet Take 1 tablet (81 mg total) by mouth in the morning. 07/13/21  Yes Leroy Sea, MD  cetirizine (ZYRTEC) 10 MG tablet Take 10 mg by mouth daily.   Yes [provider]  colchicine 0.6 MG tablet TAKE 1 TABLET (0.6 MG TOTAL) BY MOUTH 2 (TWO) TIMES DAILY AS NEEDED. Patient taking  differently: Take 0.6 mg by mouth 2 (two) times daily as needed (For Gout Flare-up). 10/29/20  Yes [name], MD  Cyanocobalamin 2500 MCG TABS Take 2,500 mcg by mouth daily.    Yes [provider]  donepezil (ARICEPT) 5 MG tablet Take 1 tablet (5 mg total) by mouth in the morning. 02/01/22  Yes [name], MD  finasteride (PROSCAR) 5 MG tablet TAKE 1 TABLET BY MOUTH DAILY Patient taking differently: Take 5 mg by mouth daily. 09/12/16  Yes [name], MD  furosemide (LASIX) 80 MG tablet Take 80 mg by mouth daily.   Yes [provider]  melatonin 5 MG TABS Take 5 mg by mouth at bedtime.   Yes [provider]  polyethylene glycol (MIRALAX / GLYCOLAX) 17 g packet Take 17 g by mouth in the morning.   Yes [provider]  tamsulosin (FLOMAX) 0.4 MG CAPS capsule Take 1 capsule (0.4 mg total) by mouth daily. 07/12/21  Yes [name], MD  VITAMIN D3 SUPER STRENGTH 50 MCG (2000 UT) CAPS Take 2,000 Units by mouth in the morning.   Yes [provider]  albuterol (VENTOLIN HFA) 108 (90 Base) MCG/ACT inhaler Inhale 2 puffs into the lungs every 4 (four) hours as needed for wheezing or shortness of breath. Patient not taking: Reported on 06/04/2022 05/06/21   Glade Lloyd, MD  furosemide (LASIX) 40 MG tablet Take 1 tablet (40  mg total) by mouth daily. Patient not taking: Reported on 06/04/2022 07/27/21   Jake Bathe, MD      Allergies    Tape    Review of Systems   Review of Systems  Physical Exam Updated Vital Signs BP (!) 137/58   Pulse 69   Temp 98.2 F (36.8 C) (Oral)   Resp (!) 24   Ht 5\' 11"  (1.803 m)   Wt 72.6 kg   SpO2 97%   BMI 22.32 kg/m  Physical Exam Vitals and nursing note reviewed.  HENT:     Head: Normocephalic.  Cardiovascular:     Rate and Rhythm: Regular rhythm.  Pulmonary:     Comments: Mildly harsh breath sounds. Abdominal:     Palpations: There is no mass.  Genitourinary:    Comments: Chronic  inguinal hernia. Musculoskeletal:     Cervical back: Neck supple.  Neurological:     Mental Status: He is alert. Mental status is at baseline.     ED Results / Procedures / Treatments   Labs (all labs ordered are listed, but only abnormal results are displayed) Labs Reviewed  CBC WITH DIFFERENTIAL/PLATELET - Abnormal; Notable for the following components:      Result Value   RBC 3.18 (*)    Hemoglobin 10.0 (*)    HCT 33.4 (*)    MCV 105.0 (*)    MCHC 29.9 (*)    RDW 17.4 (*)    Monocytes Absolute 1.2 (*)    All other components within normal limits  COMPREHENSIVE METABOLIC PANEL - Abnormal; Notable for the following components:   Calcium 8.0 (*)    Total Protein 6.0 (*)    Albumin 2.6 (*)    All other components within normal limits  CULTURE, BLOOD (ROUTINE X 2)  CULTURE, BLOOD (ROUTINE X 2)  LACTIC ACID, PLASMA  PROTIME-INR  LACTIC ACID, PLASMA  URINALYSIS, ROUTINE W REFLEX MICROSCOPIC  POC OCCULT BLOOD, ED    EKG None  Radiology DG Chest 2 View  Result Date: 06/04/2022 CLINICAL DATA:  Weakness.  Sepsis suspected. EXAM: CHEST - 2 VIEW COMPARISON:  07/03/2021 FINDINGS: There is a left chest wall pacer device with lead in the right atrial appendage and right ventricle. Stable cardiomediastinal contours. Aortic atherosclerosis. There is a new moderate left pleural effusion. Decreased aeration to the left midlung and left base may represent atelectasis and or airspace disease. Pulmonary vascular congestion noted. IMPRESSION: 1. New moderate left pleural effusion with decreased aeration to the left midlung and left base. 2. Pulmonary vascular congestion. Electronically Signed   By: 07/05/2021 M.D.   On: 06/04/2022 10:28    Procedures Procedures    Medications Ordered in ED Medications  sodium chloride 0.9 % bolus 500 mL (0 mLs Intravenous Stopped 06/04/22 1239)    ED Course/ Medical Decision Making/ A&P                           Medical Decision Making Amount  and/or Complexity of Data Reviewed Labs: ordered. Radiology: ordered.   Patient brought in for blood in the stool and potential urinary symptoms.  Lab work near baseline.  Mild anemia but near where he has been.  Creatinine at baseline.  Chest x-ray nonspecific changes potentially effusion.  Patient's daughter later arrives.  States that he seems to be at his baseline they would not bring him in.  With comorbidities and patient's age we are limiting further work-up.  Do not feel we need to check Hemoccult.  States he would not really do anything for it.  States he got recently checked for UTI.  There eager to go home I think it is reasonable for him.  We know there could be something that we are not picking up such as urinary tract infection but they feel he would do much better at home.  Will discharge with outpatient follow-up as needed        Final Clinical Impression(s) / ED Diagnoses Final diagnoses:  Weakness    Rx / DC Orders ED Discharge Orders     None         Benjiman Core, MD 06/04/22 1517

## 2022-06-05 ENCOUNTER — Telehealth: Payer: Self-pay

## 2022-06-05 ENCOUNTER — Telehealth: Payer: Self-pay | Admitting: Internal Medicine

## 2022-06-05 ENCOUNTER — Ambulatory Visit: Payer: Medicare Other

## 2022-06-05 ENCOUNTER — Ambulatory Visit (INDEPENDENT_AMBULATORY_CARE_PROVIDER_SITE_OTHER): Payer: Medicare Other

## 2022-06-05 DIAGNOSIS — Z111 Encounter for screening for respiratory tuberculosis: Secondary | ICD-10-CM | POA: Diagnosis not present

## 2022-06-05 NOTE — Telephone Encounter (Signed)
Pt's DIL, Gavin Pound, brought pt in for 2nd TB Skin test. She brought a note from the caregiver who was with the pt when he went to the ER yesterday. I made a copy of it. She wanted Dr Alphonsus Sias to be aware. She also asked for a copy of all the labs that were done at the ER. I printed those out for her. Letter given to Dr Alphonsus Sias.

## 2022-06-05 NOTE — Progress Notes (Signed)
PPD Placement note [name], 88 y.o. male is here today for placement of PPD test Reason for PPD test: Placement in Memory care Pt taken PPD test before: yes Verified in allergy area and with patient that they are not allergic to the products PPD is made of (Phenol or Tween). Yes Is patient taking any oral or IV steroid medication now or have they taken it in the last month? no Has the patient ever received the BCG vaccine?: no Has the patient been in recent contact with anyone known or suspected of having active TB disease?: no      Date of exposure (if applicable):       Name of person they were exposed to (if applicable):  Patient's Country of origin?: [place] O: Alert and oriented in NAD. P:  PPD placed on 06/05/2022 at 1220pm  Patient advised to return for reading within 48-72 hours.

## 2022-06-05 NOTE — Telephone Encounter (Signed)
Daughter called in asking could Dr Alphonsus Sias view the xray results from the hospital,and let her know if patient needs to do any follow ups or if  his lasix needs to be increased? She would like a phone call to discuss xray results.

## 2022-06-06 ENCOUNTER — Telehealth: Payer: Self-pay

## 2022-06-06 NOTE — Telephone Encounter (Signed)
Note reviewed. Nothing new or striking---illustrates why he is ready to go to assisted living

## 2022-06-06 NOTE — Telephone Encounter (Signed)
Patient daughter called in stating she is going to send in a transmission because patient has been having lots of sob and wants to know if anything abnormal is seen on the transmission

## 2022-06-06 NOTE — Telephone Encounter (Signed)
LVM on patients daughter cell informing her that his transmission was normal, upon review of chart from Dr. Vassie Moselle office called and let the daughter in law informed that patients x-ray showed increased of fluid in chest this is likely contributing to patients SOB, left message stating she could call Dr. Alphonsus Sias office.

## 2022-06-06 NOTE — Telephone Encounter (Signed)
Spoke to DIL. She will add the extra 80 mg daily and keep an eye on his weight.

## 2022-06-06 NOTE — Telephone Encounter (Signed)
Spoke to DIL. She said he is already on 80mg  daily. 40mg  is as needed. How should he take the 40 mg. A few days ago he had increased drooling and a cough. Not as much now.

## 2022-06-08 LAB — TB SKIN TEST
Induration: 0 mm
TB Skin Test: NEGATIVE

## 2022-06-09 LAB — CULTURE, BLOOD (ROUTINE X 2): Culture: NO GROWTH

## 2022-07-23 ENCOUNTER — Ambulatory Visit (INDEPENDENT_AMBULATORY_CARE_PROVIDER_SITE_OTHER): Payer: Medicare Other

## 2022-07-23 DIAGNOSIS — I441 Atrioventricular block, second degree: Secondary | ICD-10-CM

## 2022-07-24 LAB — CUP PACEART REMOTE DEVICE CHECK
Battery Remaining Longevity: 12 mo
Battery Remaining Percentage: 11 %
Battery Voltage: 2.84 V
Brady Statistic AP VP Percent: 0 %
Brady Statistic AP VS Percent: 0 %
Brady Statistic AS VP Percent: 0 %
Brady Statistic AS VS Percent: 0 %
Brady Statistic RA Percent Paced: 0 %
Brady Statistic RV Percent Paced: 97 %
Date Time Interrogation Session: 20231016020013
Implantable Lead Implant Date: 20150420
Implantable Lead Implant Date: 20150420
Implantable Lead Location: 753859
Implantable Lead Location: 753860
Implantable Pulse Generator Implant Date: 20150420
Lead Channel Impedance Value: 400 Ohm
Lead Channel Impedance Value: 460 Ohm
Lead Channel Pacing Threshold Amplitude: 0.5 V
Lead Channel Pacing Threshold Amplitude: 1 V
Lead Channel Pacing Threshold Pulse Width: 0.4 ms
Lead Channel Pacing Threshold Pulse Width: 0.4 ms
Lead Channel Sensing Intrinsic Amplitude: 2.3 mV
Lead Channel Sensing Intrinsic Amplitude: 4 mV
Lead Channel Setting Pacing Amplitude: 2 V
Lead Channel Setting Pacing Amplitude: 2.5 V
Lead Channel Setting Pacing Pulse Width: 0.4 ms
Lead Channel Setting Sensing Sensitivity: 2 mV
Pulse Gen Model: 2240
Pulse Gen Serial Number: 3013717

## 2022-08-13 NOTE — Progress Notes (Signed)
Remote pacemaker transmission.   

## 2022-08-16 ENCOUNTER — Telehealth: Payer: Self-pay

## 2022-08-16 NOTE — Telephone Encounter (Signed)
Per Daughter Matthew Howard pt is back at ALF and [facility] house calls will follow pt- no TCM done

## 2022-09-14 IMAGING — DX DG CHEST 2V
2 series · 2 of 2 positions shown · non-contrast
Comparison: 05/04/2021.

CLINICAL DATA: SOB; left sided pneumo that was dx yesterday; repeat
cxr

EXAM:
CHEST - 2 VIEW

[x chest ap]
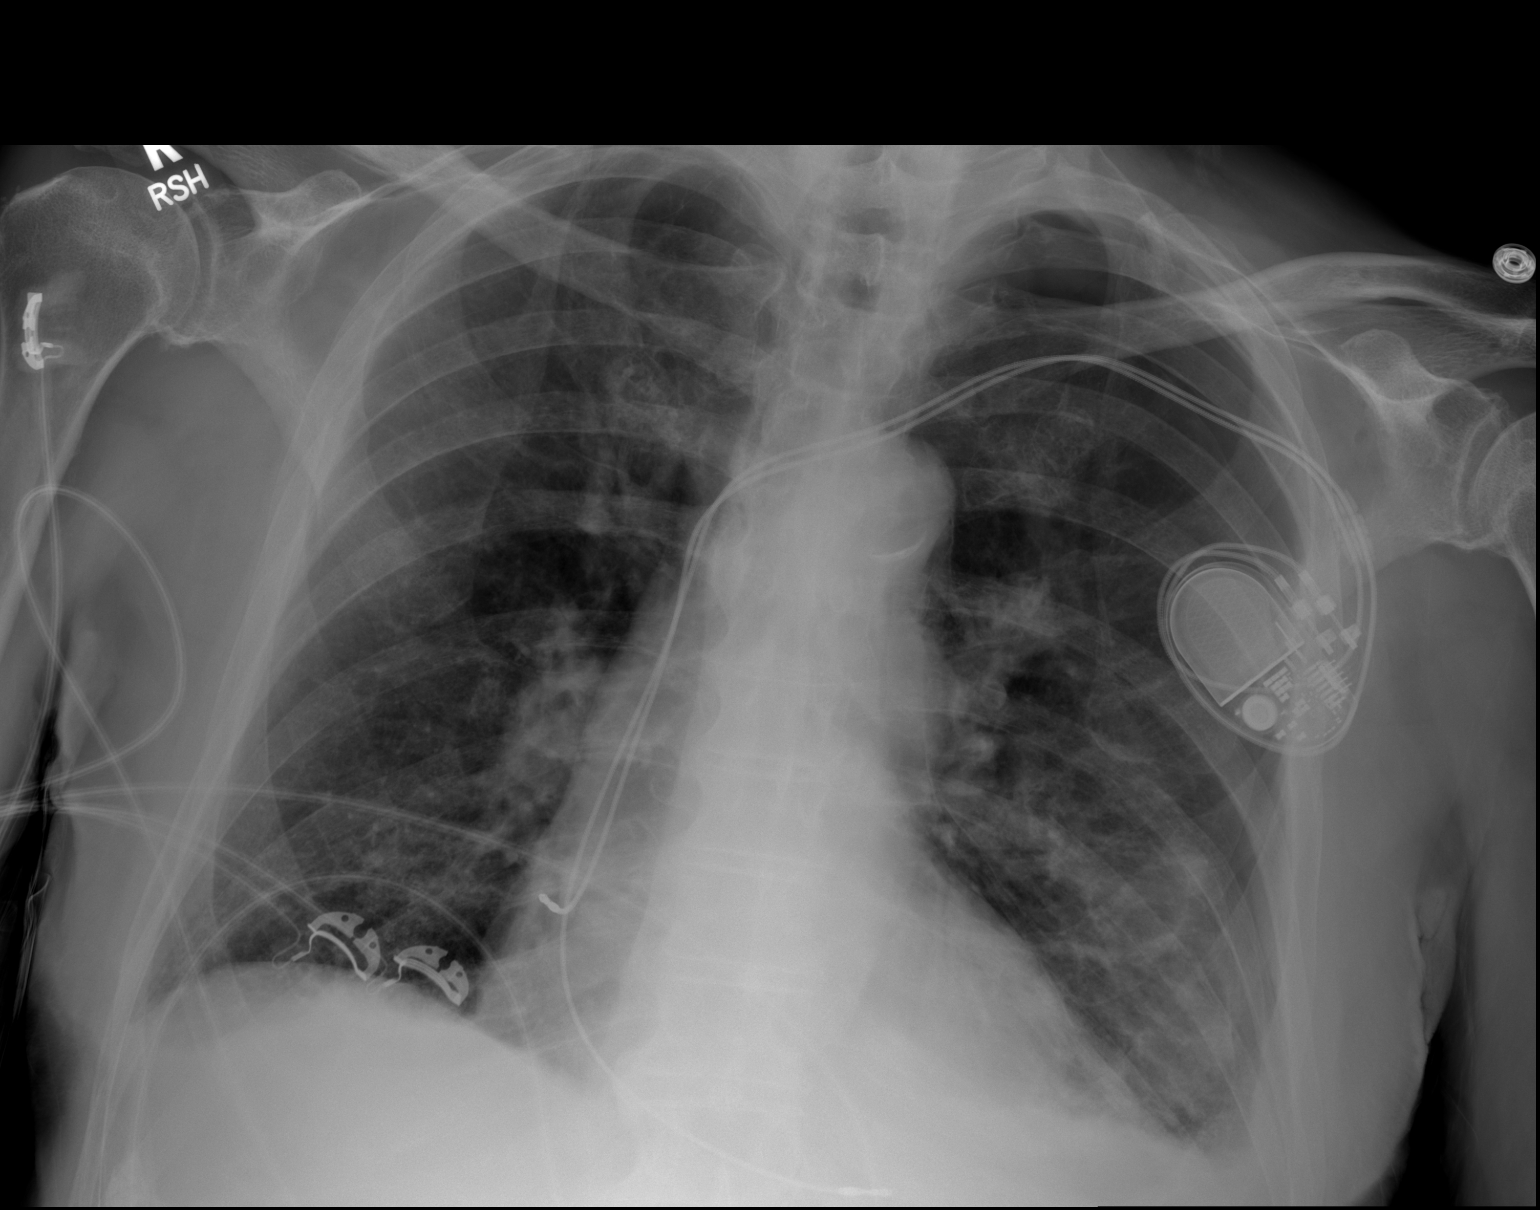

[w chest lat]
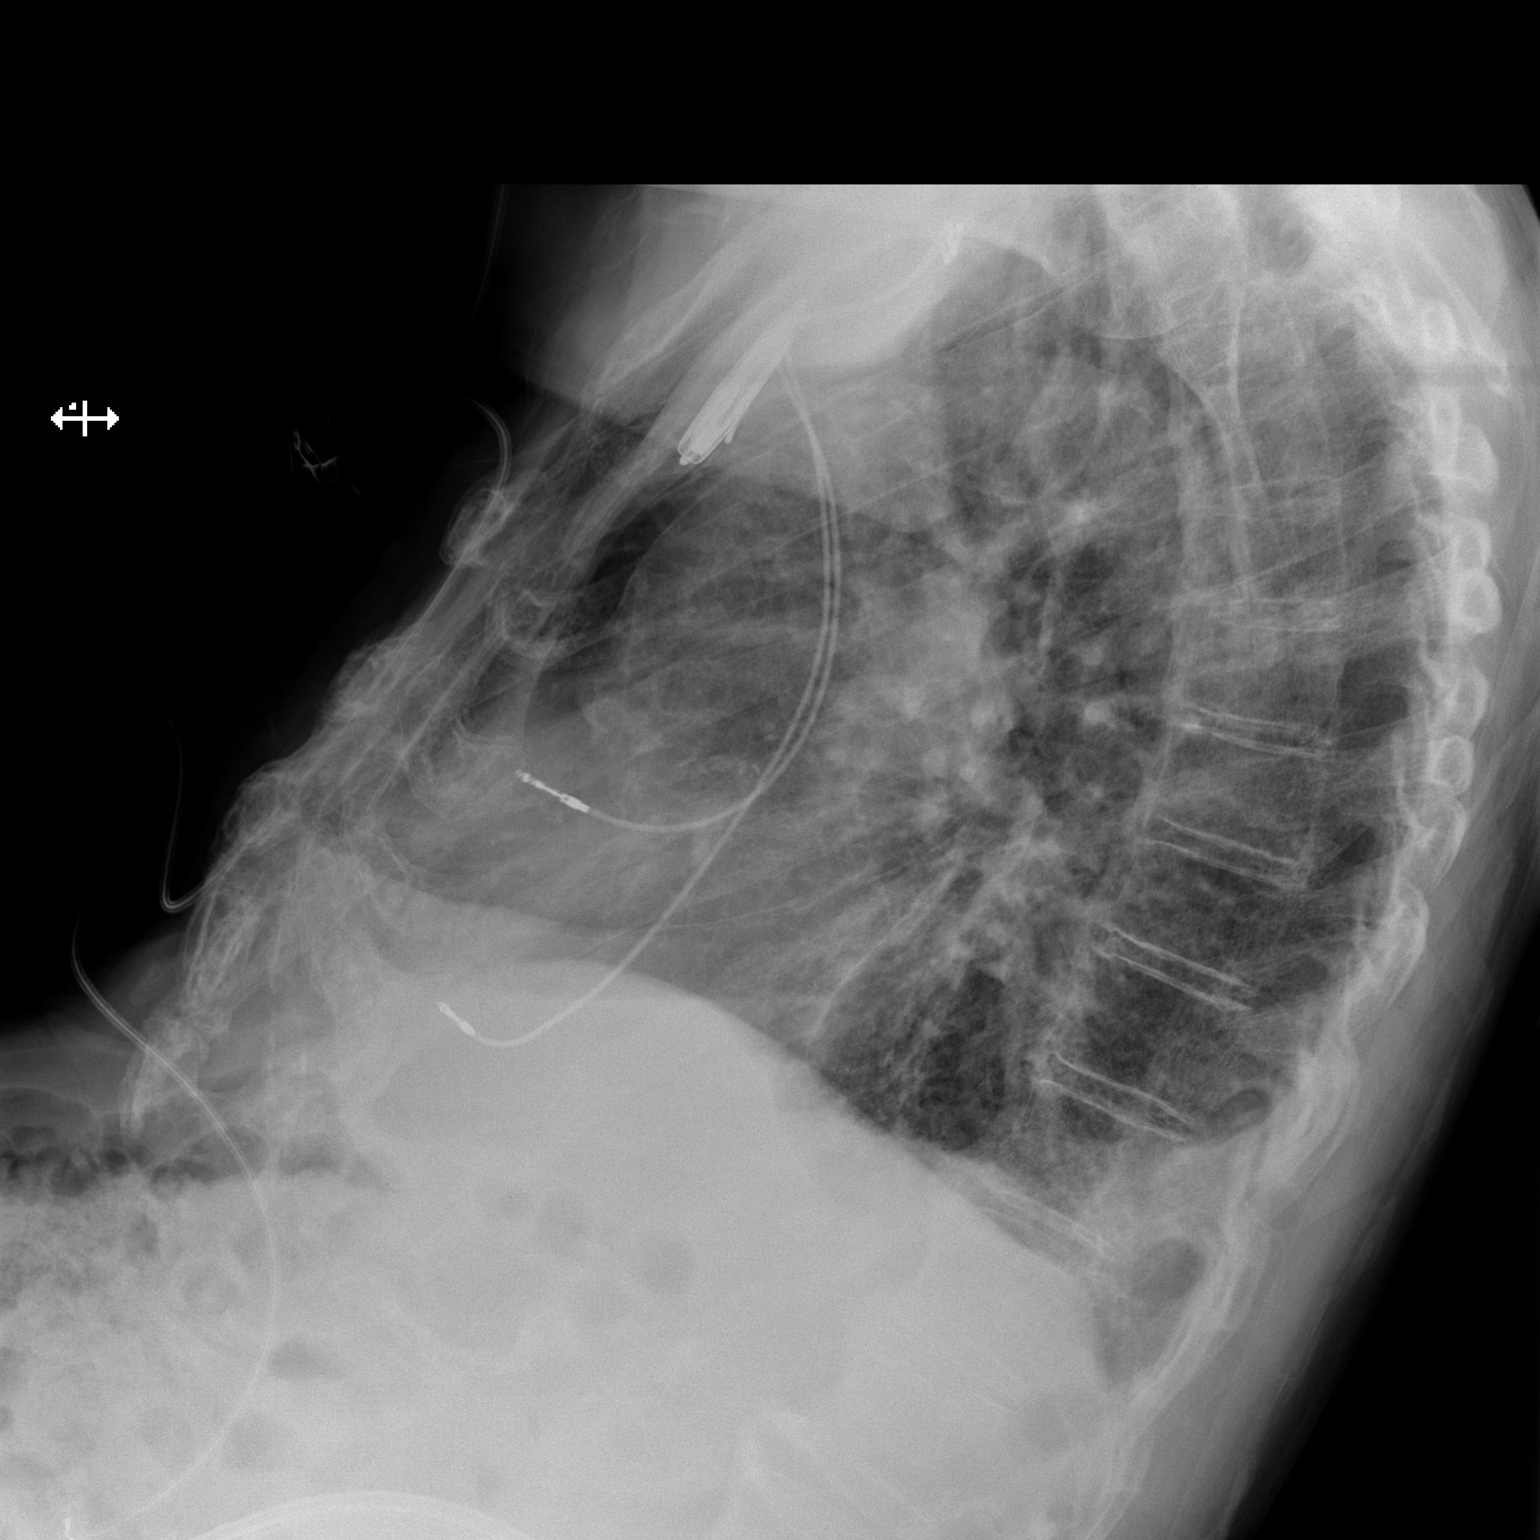

[2 of 2 positions shown; findings below may reference images not displayed]

FINDINGS: Similar size of a moderate left pneumothorax. Similar versus
slightly increased small left pleural effusion with overlying
streaky left basilar opacities. Unchanged cardiomediastinal
silhouette. Atherosclerosis of the aorta. Similar position of a dual
lead left subclavian approach cardiac rhythm maintenance device.
Osteopenia. Polyarticular degenerative change.
IMPRESSION: 1. Similar moderate left pneumothorax.
2. Similar versus slightly increased small left pleural effusion and
streaky overlying left basilar opacities, which could represent
atelectasis, aspiration, and/or pneumonia.

## 2022-09-26 IMAGING — DX DG CHEST 2V
2 series · 2 of 2 positions shown · non-contrast
Comparison: Chest x-ray dated May 09, 2021

CLINICAL DATA: Pneumothorax

EXAM:
CHEST - 2 VIEW

[dg chest 2 view (1 of 2)]
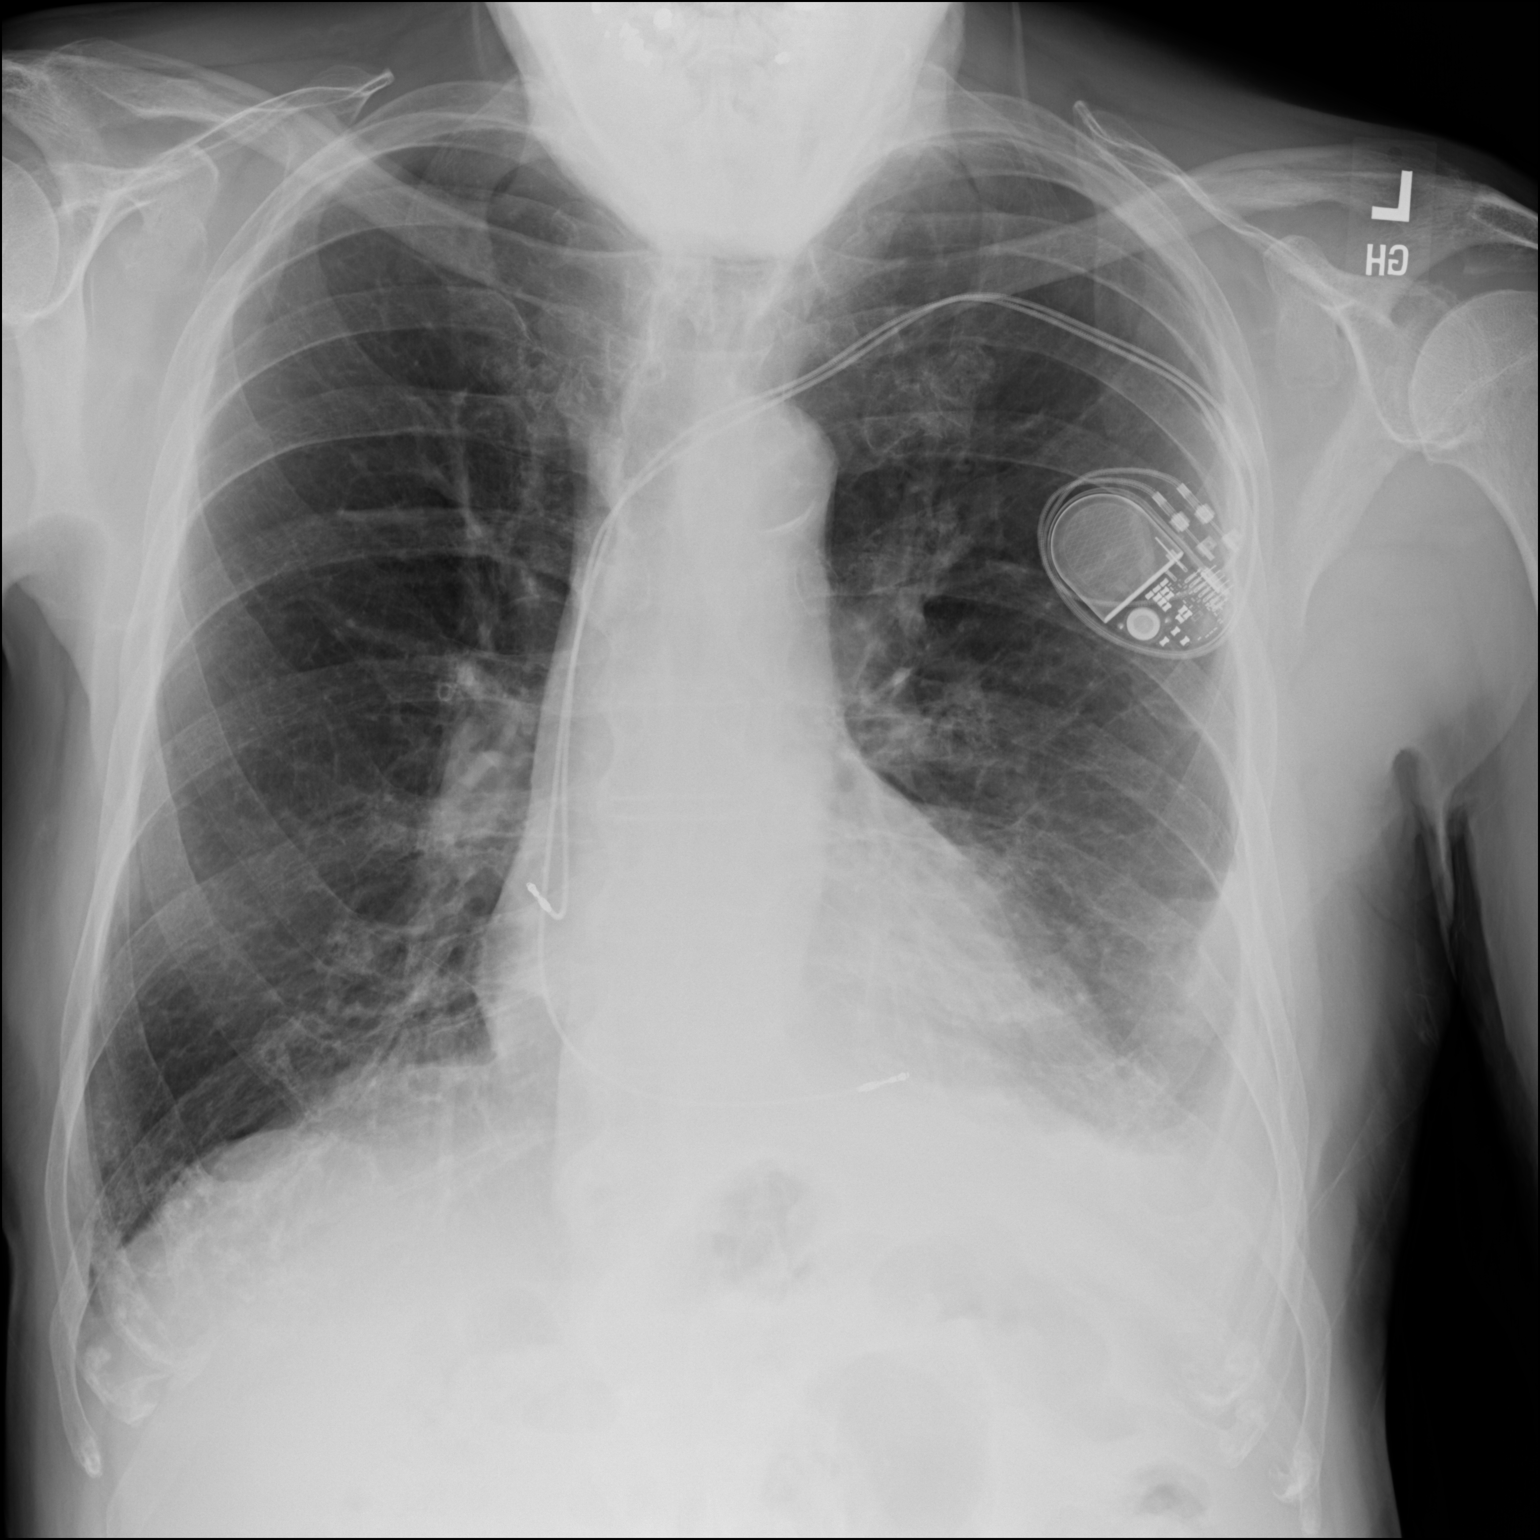

[dg chest 2 view (2 of 2)]
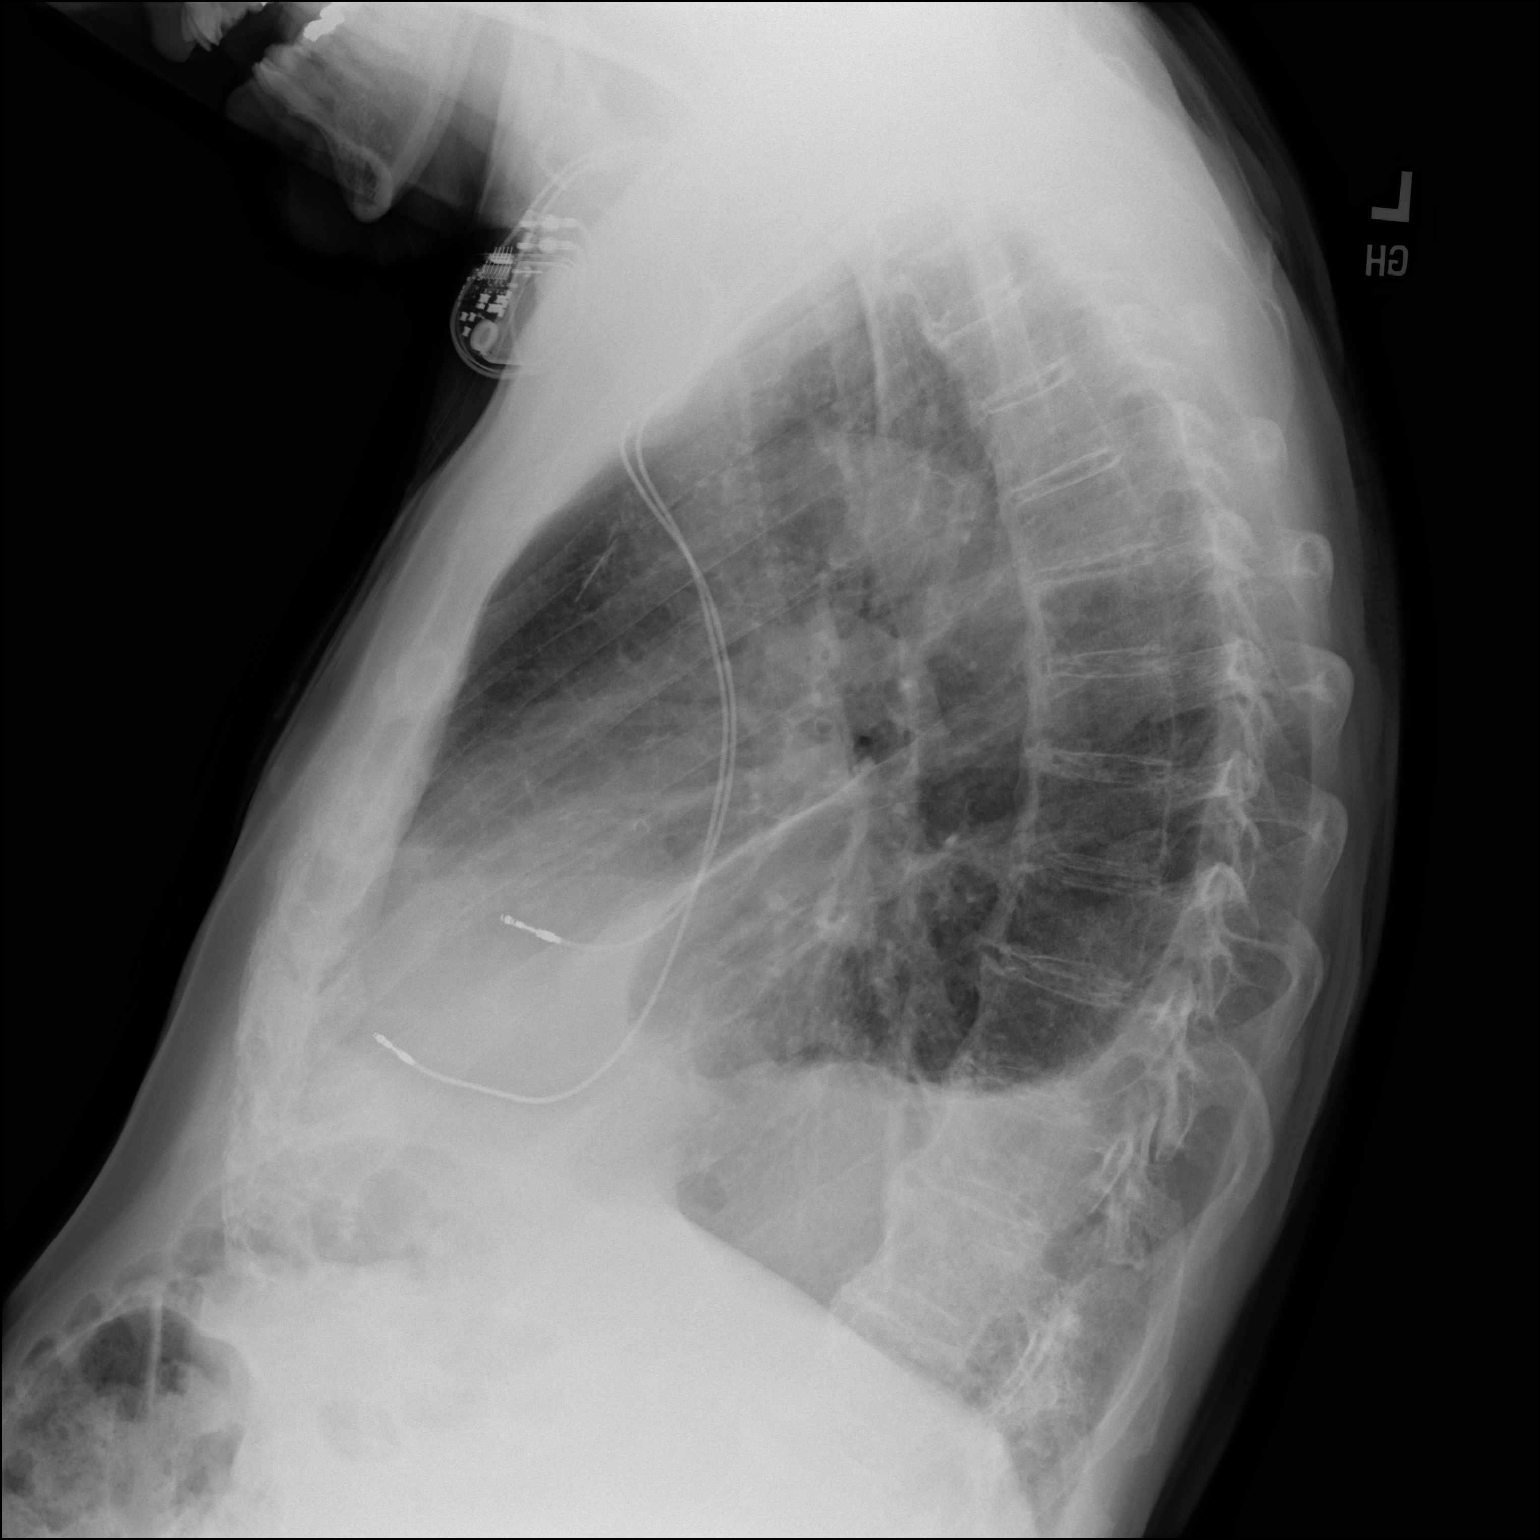

[2 of 2 positions shown; findings below may reference images not displayed]

FINDINGS: Unchanged cardiac and mediastinal contours. Dual left chest wall
pacer with unchanged lead position.

Redemonstrated left hydropneumothorax with decreased size of the air
component and slight increased size of fluid component.

Bibasilar atelectasis.
IMPRESSION: Redemonstrated left hydropneumothorax with decreased size of the air
component and slight increased size of fluid component.

## 2022-12-02 ENCOUNTER — Encounter: Payer: Self-pay | Admitting: Internal Medicine

## 2022-12-02 ENCOUNTER — Encounter: Payer: Self-pay | Admitting: Cardiology

## 2022-12-07 DEATH — deceased
